# Patient Record
Sex: Female | Born: 1985 | Race: Black or African American | Hispanic: No | Marital: Single | Smoking: Current every day smoker
Health system: Southern US, Community
[De-identification: ages and names within clinical notes are randomized; demographics above are authoritative.]

## PROBLEM LIST (undated history)

## (undated) ENCOUNTER — Inpatient Hospital Stay (HOSPITAL_COMMUNITY): Payer: Self-pay

## (undated) DIAGNOSIS — L309 Dermatitis, unspecified: Secondary | ICD-10-CM

## (undated) DIAGNOSIS — Z9109 Other allergy status, other than to drugs and biological substances: Secondary | ICD-10-CM

## (undated) DIAGNOSIS — K649 Unspecified hemorrhoids: Secondary | ICD-10-CM

## (undated) DIAGNOSIS — J45909 Unspecified asthma, uncomplicated: Secondary | ICD-10-CM

## (undated) DIAGNOSIS — Z8619 Personal history of other infectious and parasitic diseases: Secondary | ICD-10-CM

## (undated) DIAGNOSIS — J309 Allergic rhinitis, unspecified: Secondary | ICD-10-CM

## (undated) DIAGNOSIS — E890 Postprocedural hypothyroidism: Secondary | ICD-10-CM

## (undated) DIAGNOSIS — Z8639 Personal history of other endocrine, nutritional and metabolic disease: Secondary | ICD-10-CM

## (undated) DIAGNOSIS — Z8701 Personal history of pneumonia (recurrent): Secondary | ICD-10-CM

## (undated) HISTORY — PX: ADENOIDECTOMY: SUR15

---
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
## 2006-12-26 ENCOUNTER — Ambulatory Visit: Payer: Self-pay | Admitting: Nurse Practitioner

## 2008-01-11 ENCOUNTER — Emergency Department (HOSPITAL_COMMUNITY): Admission: EM | Admit: 2008-01-11 | Discharge: 2008-01-11 | Payer: Self-pay | Admitting: Emergency Medicine

## 2008-01-12 ENCOUNTER — Emergency Department (HOSPITAL_COMMUNITY): Admission: EM | Admit: 2008-01-12 | Discharge: 2008-01-12 | Payer: Self-pay | Admitting: Emergency Medicine

## 2008-08-03 ENCOUNTER — Inpatient Hospital Stay (HOSPITAL_COMMUNITY): Admission: AD | Admit: 2008-08-03 | Discharge: 2008-08-03 | Payer: Self-pay | Admitting: Obstetrics & Gynecology

## 2008-08-13 HISTORY — PX: WISDOM TOOTH EXTRACTION: SHX21

## 2008-08-15 ENCOUNTER — Inpatient Hospital Stay (HOSPITAL_COMMUNITY): Admission: AD | Admit: 2008-08-15 | Discharge: 2008-08-15 | Payer: Self-pay | Admitting: Family Medicine

## 2009-02-09 ENCOUNTER — Inpatient Hospital Stay (HOSPITAL_COMMUNITY): Admission: AD | Admit: 2009-02-09 | Discharge: 2009-02-09 | Payer: Self-pay | Admitting: Obstetrics and Gynecology

## 2009-03-08 ENCOUNTER — Inpatient Hospital Stay (HOSPITAL_COMMUNITY): Admission: AD | Admit: 2009-03-08 | Discharge: 2009-03-11 | Payer: Self-pay | Admitting: Obstetrics and Gynecology

## 2009-04-27 ENCOUNTER — Emergency Department (HOSPITAL_COMMUNITY): Admission: EM | Admit: 2009-04-27 | Discharge: 2009-04-27 | Payer: Self-pay | Admitting: Emergency Medicine

## 2010-11-19 LAB — CBC
HCT: 30.6 % — ABNORMAL LOW (ref 36.0–46.0)
HCT: 35.6 % — ABNORMAL LOW (ref 36.0–46.0)
Hemoglobin: 12.3 g/dL (ref 12.0–15.0)
MCHC: 34.5 g/dL (ref 30.0–36.0)
MCHC: 35 g/dL (ref 30.0–36.0)
MCV: 89.4 fL (ref 78.0–100.0)
MCV: 90 fL (ref 78.0–100.0)
Platelets: 185 10*3/uL (ref 150–400)
Platelets: 236 10*3/uL (ref 150–400)
RDW: 13.7 % (ref 11.5–15.5)
RDW: 13.9 % (ref 11.5–15.5)
WBC: 10.8 10*3/uL — ABNORMAL HIGH (ref 4.0–10.5)

## 2010-11-20 LAB — TSH: TSH: 1.733 u[IU]/mL (ref 0.350–4.500)

## 2010-11-20 LAB — CBC
MCHC: 35 g/dL (ref 30.0–36.0)
Platelets: 225 10*3/uL (ref 150–400)
RDW: 13.2 % (ref 11.5–15.5)

## 2010-11-20 LAB — DIFFERENTIAL
Basophils Relative: 0 % (ref 0–1)
Eosinophils Absolute: 0 10*3/uL (ref 0.0–0.7)
Lymphs Abs: 1.5 10*3/uL (ref 0.7–4.0)
Monocytes Absolute: 0.6 10*3/uL (ref 0.1–1.0)
Monocytes Relative: 9 % (ref 3–12)
Neutro Abs: 4.6 10*3/uL (ref 1.7–7.7)
Neutrophils Relative %: 69 % (ref 43–77)

## 2010-11-20 LAB — COMPREHENSIVE METABOLIC PANEL
ALT: 13 U/L (ref 0–35)
Albumin: 2.9 g/dL — ABNORMAL LOW (ref 3.5–5.2)
Alkaline Phosphatase: 83 U/L (ref 39–117)
Calcium: 9.2 mg/dL (ref 8.4–10.5)
Glucose, Bld: 69 mg/dL — ABNORMAL LOW (ref 70–99)
Potassium: 4.2 mEq/L (ref 3.5–5.1)
Sodium: 133 mEq/L — ABNORMAL LOW (ref 135–145)
Total Protein: 6.7 g/dL (ref 6.0–8.3)

## 2010-11-20 LAB — ACETAMINOPHEN LEVEL: Acetaminophen (Tylenol), Serum: <10 ug/mL — ABNORMAL LOW (ref 10–30)

## 2010-11-20 LAB — T4: T4, Total: 12.7 ug/dL — ABNORMAL HIGH (ref 5.0–12.5)

## 2010-11-27 LAB — URINALYSIS, ROUTINE W REFLEX MICROSCOPIC
Nitrite: NEGATIVE
Specific Gravity, Urine: 1.015 (ref 1.005–1.030)
Urobilinogen, UA: 1 mg/dL (ref 0.0–1.0)
pH: 6.5 (ref 5.0–8.0)

## 2010-12-26 NOTE — H&P (Signed)
Gwendolyn Clay, PITA NO.:  [id number]   MEDICAL RECORD NO.:  [id number]          PATIENT TYPE:  INP   LOCATION:  9104                          FACILITY:  [facility]   PHYSICIAN:  Janine Limbo, M.D.DATE OF BIRTH:  [birth year]   DATE OF ADMISSION:  03/08/2009  DATE OF DISCHARGE:                              HISTORY & PHYSICAL   HISTORY OF PRESENT ILLNESS:  [name] is a 25 year old female, gravida  1, para 0, who presents at [redacted] weeks gestation (EDC is March 14, 2009).  The patient has been followed at the [facility] and  Gynecology for this pregnancy that has been complicated by a late  presentation for prenatal care.  Otherwise, she has done reasonably  well.  She does have a history of Graves disease.  Her thyroid testing  has been normal.   PAST MEDICAL HISTORY:  The patient has an elevated BMI.  She also has a  history of Graves disease, but her thyroid testing has been normal  during this pregnancy.  She had a tonsillectomy and adenoidectomy at age  58 years.   DRUG ALLERGIES:  No known drug allergies, LATEX allergies, or BETADINE  allergies.   SOCIAL HISTORY:  The patient is single.  She denies cigarette use,  alcohol use, and recreational drug use.   REVIEW OF SYSTEMS:  Normal pregnancy complaints except for rupture of  membranes earlier today.   FAMILY HISTORY:  Noncontributory.   PHYSICAL EXAMINATION:  VITAL SIGNS:  Weight is 285 pounds, height is 5  feet 10 inches.  HEENT:  Within normal limits.  CHEST:  Clear.  HEART:  Regular rate and rhythm.  BREASTS:  Without masses.  ABDOMEN:  Gravid and her fundal height is 37 cm.  EXTREMITIES:  Grossly normal.  NEUROLOGIC:  Grossly normal.   Pelvic exam, cervix is 3 cm dilated, 70% effaced, and -2 station.   LABORATORY VALUES:  Blood type is A+, antibody screen negative, VDRL is  nonreactive, sickle cell negative, rubella immune, HBsAg negative, GC  negative, Chlamydia negative.   Alpha-fetoprotein was normal.  Glucose is  79.  Third trimester beta-strep is negative.   ASSESSMENT:  1. A 39 weeks' gestation.  2. Rupture of membranes.  3. The patient is not in active labor.  Contractions are mild and her      nonstress test is reactive.  4. Graves disease.   PLAN:  The patient has been started on Pitocin to augment her labor.       Janine Limbo, M.D.  Electronically Signed     AVS/MEDQ  D:  03/08/2009  T:  03/09/2009  Job:  [id number]

## 2010-12-26 NOTE — Discharge Summary (Signed)
[name], [name]            ACCOUNT NO.:  [id number]   MEDICAL RECORD NO.:  [id number]          PATIENT TYPE:  INP   LOCATION:  9104                          [facility]ACILITY:  [facility]   PHYSICIAN:  Naima A. Dillard, M.D. DATE OF BIRTH:  1986/07/07   DATE OF ADMISSION:  03/08/2009  DATE OF DISCHARGE:  03/11/2009                               DISCHARGE SUMMARY   Ms. Gwendolyn Clay is a 25 year old gravida 1, para 1 who presented at 55 weeks'  gestation, due date is March 14, 2009.  She has been followed by [facility] for this pregnancy and it has been complicated by late  presentation for prenatal care.  She transferred from [place].  She  does have a history of Graves disease and thyroid testing has been  normal.   PAST MEDICAL HISTORY:  She had an elevated BMI.  She also had a history  of Graves disease, but her thyroid testing was normal.  She had a T and  A at age 71.   DRUG ALLERGIES:  No known drug allergies, latex allergies, or Betadine  allergies.   SOCIAL HISTORY:  She is single.  Denies cigarette use, alcohol, and  recreational drugs.   Normal pregnancy complaints.  Today, she ruptured the membranes on the  27th.   FAMILY HISTORY:  Noncontributory.   On admission, she was 3 cm dilated, 70% effaced, and -2.  She is A  positive, antibody screen negative, VDRL nonreactive, sickle cell  negative, rubella immune, HBsAg negative, GBS negative, Chlamydia  negative, AFP was normal.  She was admitted at 3 cm dilated.  Blood  pressure is 126/81.  Fetal heart tone was 130s to 140s with  accelerations noted.  She continued laboring and on March 09, 2009, at  4:42 a.m., she had a spontaneous vaginal delivery of a viable female  infant weighing 5 pounds 12 ounces, named Marita, Apgars were 8 and 9.  She had a second-degree midline laceration with a first-degree left  periurethral laceration that was found and the patient was delivered by  Dr. Stefano Gaul.  The placenta was sent to the  lab.  Postpartum course was  uneventful on day of discharge, March 11, 2009, at 11:20 a.m.  Vital  signs were normal.  Lungs clear bilaterally.  Heart, regular rate  without murmur.  Abdomen, fundus firm.  No distention.  Laceration was  healing.  Homan sign was negative on postpartum day #2 and she was doing  well and was discharged home.  She was discharged home with a  prescription for ferrous sulfate  1 b.i.d. due to hemoglobin of 10.7; pre-delivery, her hemoglobin was 12.  She is breastfeeding.  She would like to have an IUD ParaGard for birth  control.  Her blood type is A positive, HBsAg was negative, RPR was  negative, and HIV was negative.      Jasmine Awe, CNM      Naima A. Normand Sloop, M.D.  Electronically Signed    JM/MEDQ  D:  03/11/2009  T:  03/12/2009  Job:  [id number]

## 2011-01-12 IMAGING — US US OB COMP +14 WK
2 series · 14 of 28 positions shown · non-contrast
Comparison: none

OBSTETRICAL ULTRASOUND:
 This ultrasound exam was performed in the [HOSPITAL] Ultrasound Department.  The OB US report was generated in the AS system, and faxed to the ordering physician.  This report is also available in [REDACTED] PACS.

[Series 1: us ob comp +14 wk · 0.24mm/px · 2 of 10 slices shown (1 of 2)]
[im 3/10]
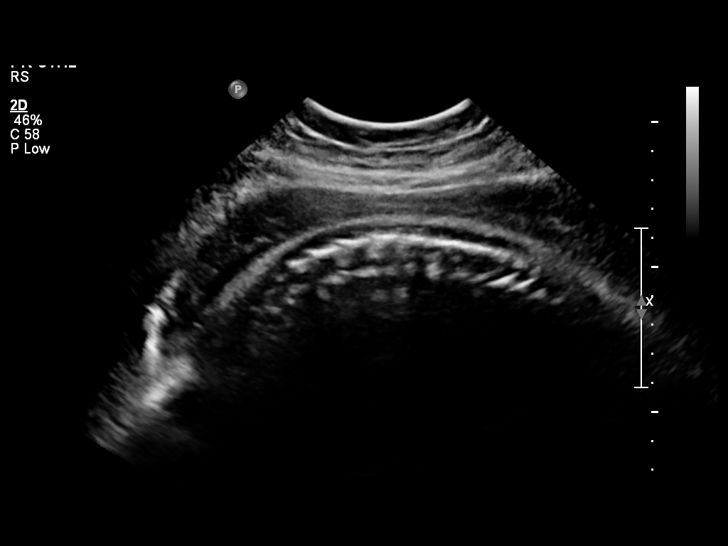
[im 7/10]
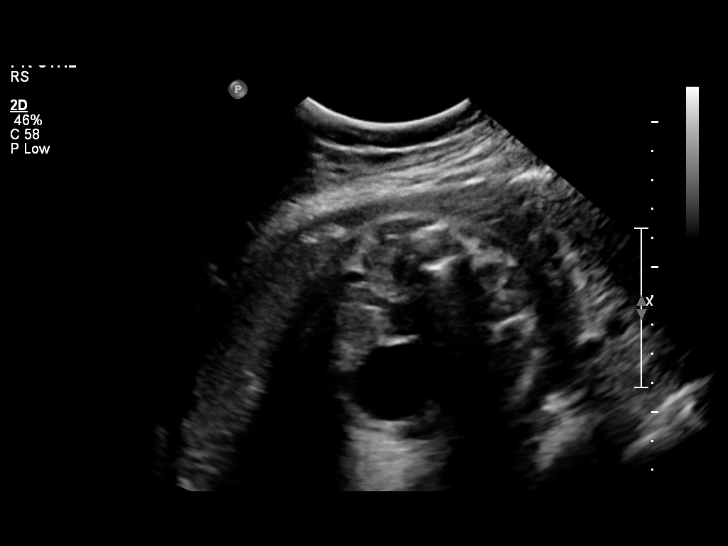

[Series 1: us ob comp +14 wk · 0.30mm/px · 12 of 41 slices shown (2 of 2)]
[im 1/41]
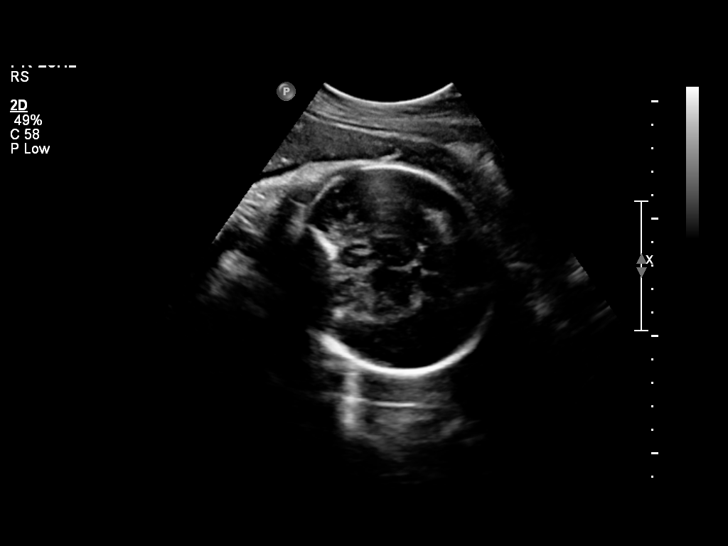
[im 4/41]
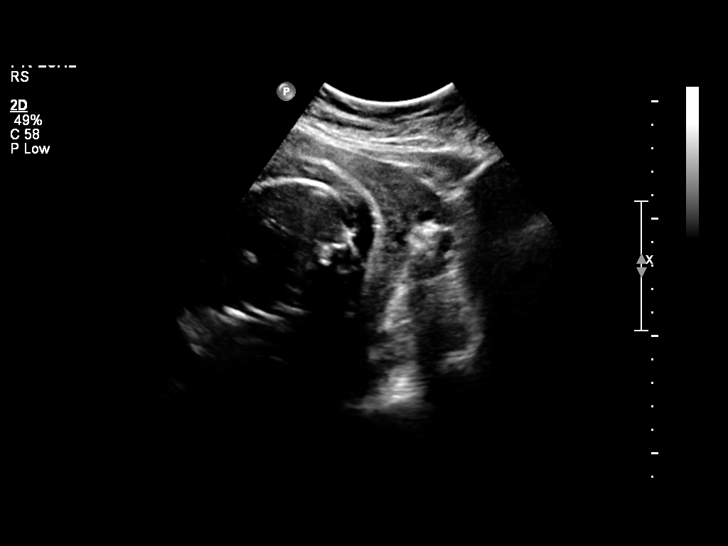
[im 8/41]
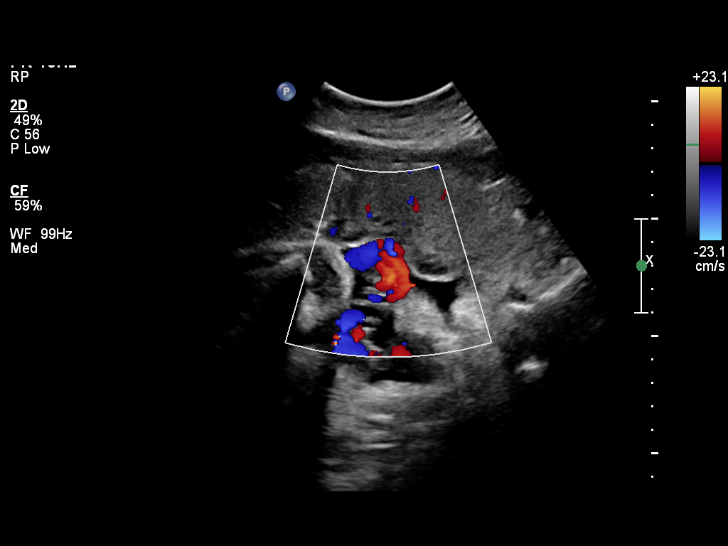
[im 11/41]
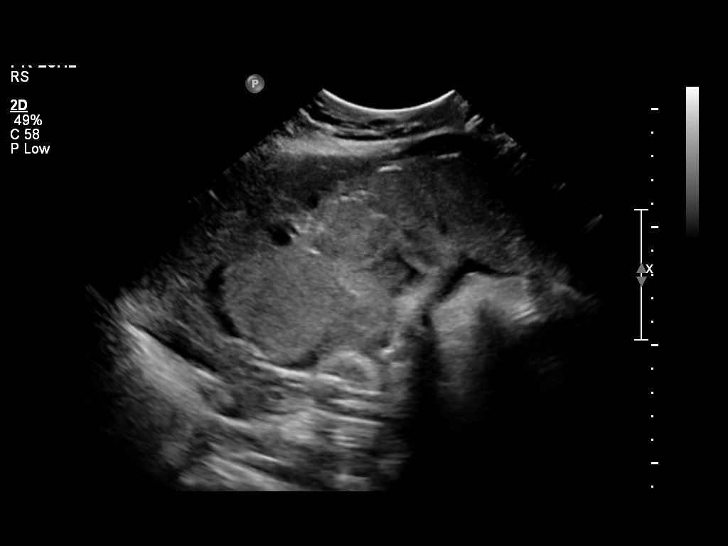
[im 15/41]
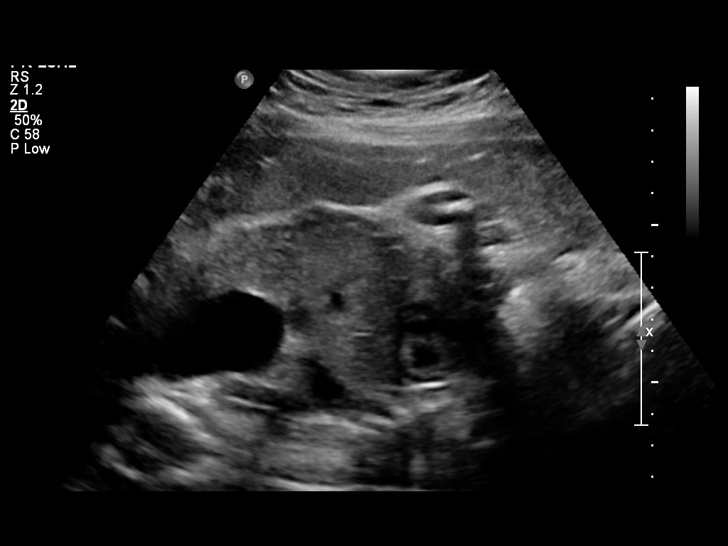
[im 19/41]
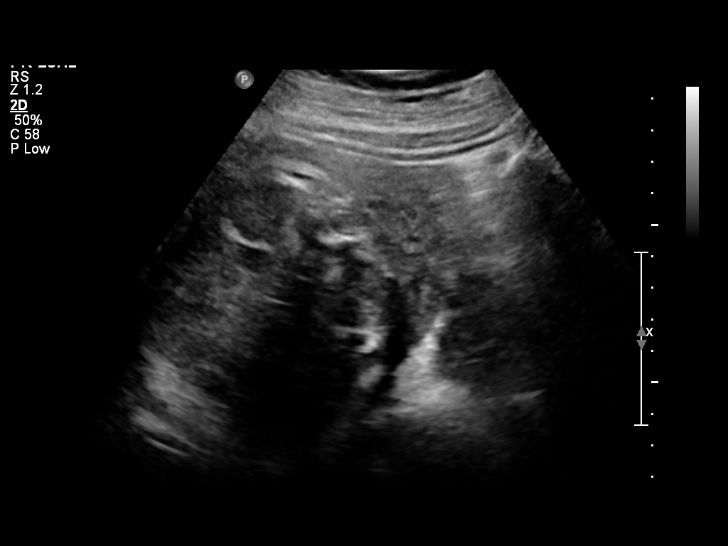
[im 22/41]
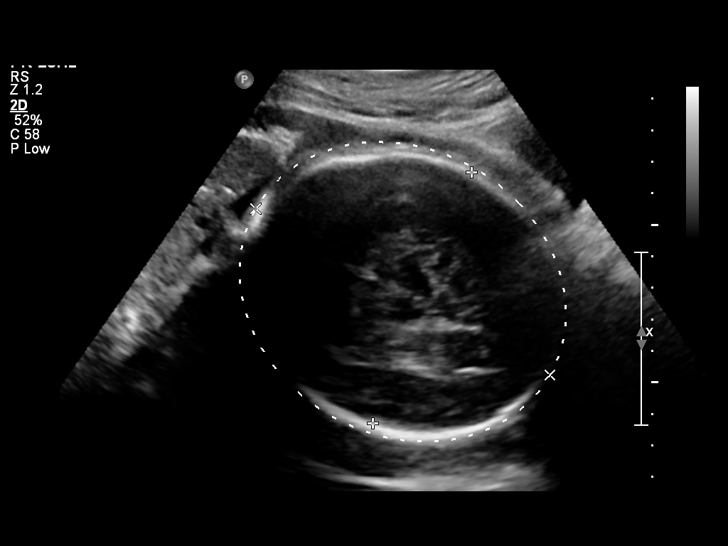
[im 26/41]
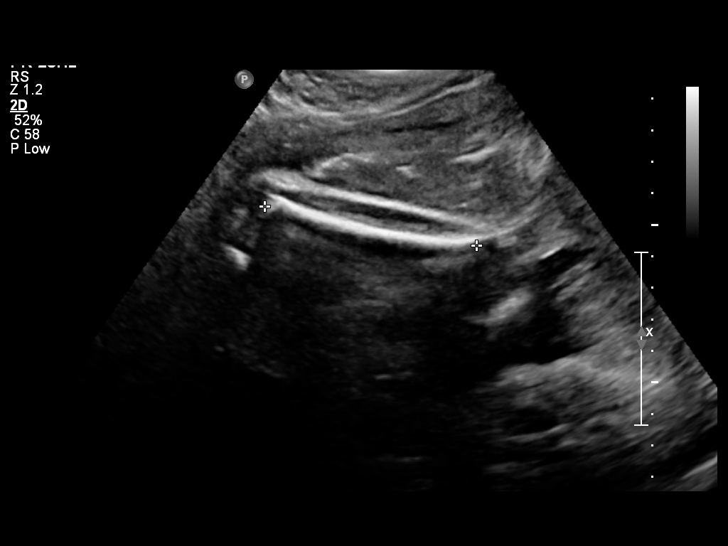
[im 30/41]
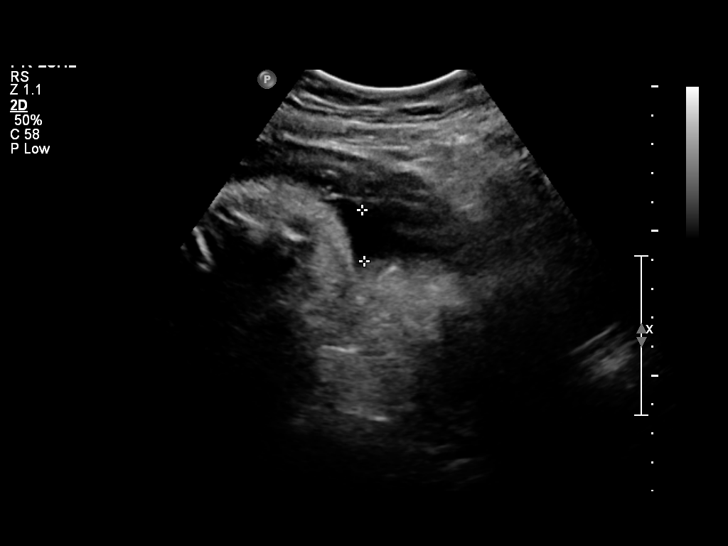
[im 33/41]
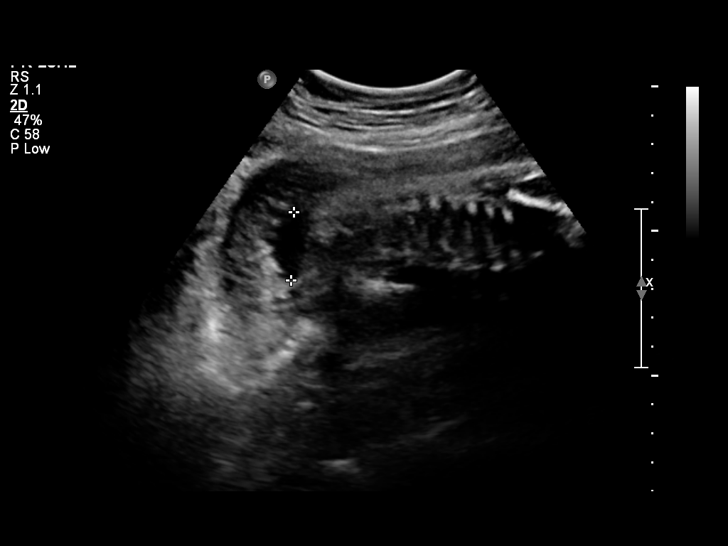
[im 37/41]
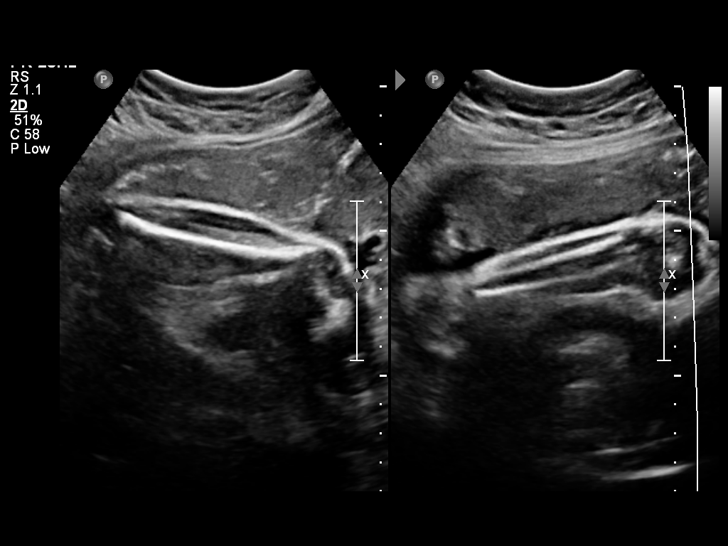
[im 41/41]
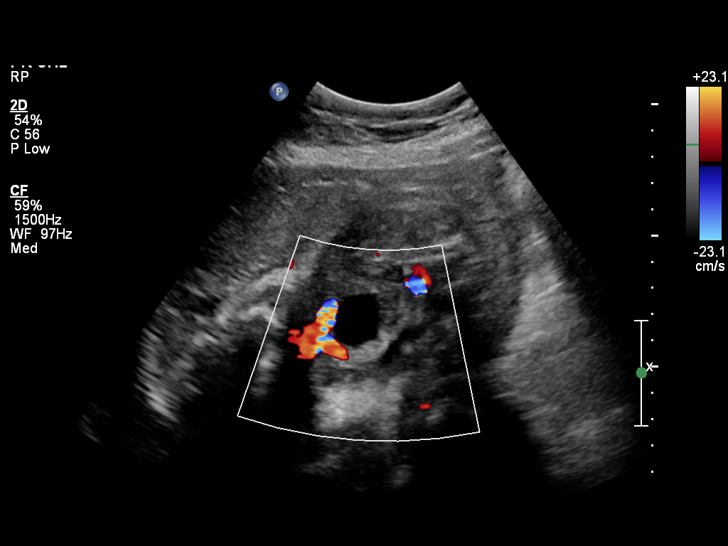

[14 of 28 positions shown; findings below may reference images not displayed]

IMPRESSION: See AS Obstetric US report.

## 2011-04-26 ENCOUNTER — Inpatient Hospital Stay (HOSPITAL_COMMUNITY): Payer: Medicaid Other

## 2011-04-26 ENCOUNTER — Inpatient Hospital Stay (HOSPITAL_COMMUNITY)
Admission: AD | Admit: 2011-04-26 | Discharge: 2011-04-26 | Disposition: A | Discharge status: Home / Self Care | Payer: Medicaid Other | Source: Ambulatory Visit | Attending: Family Medicine | Admitting: Family Medicine

## 2011-04-26 ENCOUNTER — Encounter (HOSPITAL_COMMUNITY): Payer: Self-pay

## 2011-04-26 DIAGNOSIS — Z30432 Encounter for removal of intrauterine contraceptive device: Secondary | ICD-10-CM | POA: Insufficient documentation

## 2011-04-26 DIAGNOSIS — N760 Acute vaginitis: Secondary | ICD-10-CM | POA: Insufficient documentation

## 2011-04-26 DIAGNOSIS — B9689 Other specified bacterial agents as the cause of diseases classified elsewhere: Secondary | ICD-10-CM | POA: Insufficient documentation

## 2011-04-26 DIAGNOSIS — A499 Bacterial infection, unspecified: Secondary | ICD-10-CM | POA: Insufficient documentation

## 2011-04-26 LAB — URINALYSIS, ROUTINE W REFLEX MICROSCOPIC
Bilirubin Urine: NEGATIVE
Glucose, UA: NEGATIVE mg/dL
Ketones, ur: NEGATIVE mg/dL
Leukocytes, UA: NEGATIVE
Nitrite: NEGATIVE
Protein, ur: NEGATIVE mg/dL
Specific Gravity, Urine: 1.025 (ref 1.005–1.030)
Urobilinogen, UA: 0.2 mg/dL (ref 0.0–1.0)
pH: 6 (ref 5.0–8.0)

## 2011-04-26 LAB — URINE MICROSCOPIC-ADD ON

## 2011-04-26 LAB — WET PREP, GENITAL
Trich, Wet Prep: NONE SEEN
Yeast Wet Prep HPF POC: NONE SEEN

## 2011-04-26 LAB — CBC
HCT: 38.6 % (ref 36.0–46.0)
Hemoglobin: 13.2 g/dL (ref 12.0–15.0)
MCH: 29.9 pg (ref 26.0–34.0)
MCHC: 34.2 g/dL (ref 30.0–36.0)
MCV: 87.5 fL (ref 78.0–100.0)
Platelets: 289 K/uL (ref 150–400)
RBC: 4.41 MIL/uL (ref 3.87–5.11)
RDW: 13.4 % (ref 11.5–15.5)
WBC: 5.7 K/uL (ref 4.0–10.5)

## 2011-04-26 LAB — POCT PREGNANCY, URINE: Preg Test, Ur: NEGATIVE

## 2011-04-26 MED ORDER — METRONIDAZOLE 500 MG PO TABS: 500.0000 mg | ORAL_TABLET | Freq: Two times a day (BID) | ORAL | Status: AC

## 2011-04-26 NOTE — ED Provider Notes (Signed)
History     Chief Complaint  Patient presents with  . Abdominal Pain   HPIGabrielle I Gbonah25 y.o.G1 P1.  [facility] patient of [facility] cannot go back now because her Medicaid expired.  Had IUD placed 9/10 stayed in until Nov 11.  It was removed because "it was slipping out".  Had plan to have U/S to eval for fibroids but insurance ran out.  Had another IUD placed 11/11.  It is a [employer] fast streamer.  Is asking for IUD removal today because she is "moody".  She understands this will take away her contraception.  Is sexually active X 1 partner.  LMP  6 days ago.  Cycles are irregular.   Had a knot on her right abdomen that is tender.   Also report mood swings, fatigue, nausea off and on.    Past Medical History  Diagnosis Date  . Hypothyroidism     Past Surgical History  Procedure Date  . Adenoidectomy     No family history on file.  History  Substance Use Topics  . Smoking status: Current Everyday Smoker -- 0.5 packs/day  . Smokeless tobacco: Never Used  . Alcohol Use: No    Allergies: No Known Allergies  No prescriptions prior to admission    Review of Systems  Constitutional: Positive for malaise/fatigue.  Gastrointestinal: Positive for nausea and abdominal pain (knot on the right side that is tender). Negative for vomiting.  Genitourinary:       Denies vaginal discharge.   Physical Exam   Blood pressure 116/77, pulse 73, temperature 98.4 F (36.9 C), temperature source Oral, resp. rate 20, height 5' 9.5" (1.765 m), weight 288 lb 6.4 oz (130.817 kg), last menstrual period 04/20/2011, SpO2 97.00%.  Physical Exam  Constitutional: She is oriented to person, place, and time. She appears well-developed and well-nourished. No distress.  Neck: Normal range of motion.  Respiratory: Effort normal.  GI: Soft. She exhibits no distension and no mass. There is Tenderness: mild tenderness over a small bb shaped nodulethat is firm but mobile superficial left lower quadrant.. There is no rebound and  no guarding.  Genitourinary: Uterus is enlarged and tender. Cervix exhibits no motion tenderness, no discharge and no friability. Right adnexum displays no mass, no tenderness and no fullness. Left adnexum displays no mass, no tenderness and no fullness. There is bleeding (small amount of scant foul smelling.  IUD string visualized) around the vagina.  Neurological: She is alert and oriented to person, place, and time.  Skin: Skin is warm and dry.    MAU Course  Procedures   Gc/Chl culture to lab  MDM Care turned over to E. Rice, PA.  Assessment and Plan    KEY,EVE M 04/26/2011, 4:31 PM   Gwendolyn Holmes, NP 04/26/11 1751

## 2011-04-26 NOTE — Progress Notes (Signed)
Pt states she has a lump on her right side, has IUD, questions if this is from the IUD. Upper abdominal cramping, has been nauseated, irritable, fatigued, pain at site of "lump".

## 2011-04-26 NOTE — Progress Notes (Signed)
Pt states she has been having RLQ to mid/upper abdominal pain for a few weeks. Noticed a lump on the RLQ with tenderness to palpation. Pt has a Mirena that was placed in November.

## 2011-04-26 NOTE — ED Provider Notes (Signed)
Attestation of Attending Supervision of Advanced Practitioner: Evaluation and management procedures were performed by the PA/NP/CNM/OB Fellow under my supervision/collaboration. Chart reviewed and agree with management and plan.  Oney Folz A 04/26/2011 6:45 PM

## 2011-04-26 NOTE — ED Provider Notes (Signed)
Chart reviewed and agree with management and plan.  

## 2011-04-26 NOTE — ED Provider Notes (Signed)
History   I have assumed care of this pt from Jeani Sow, FNP. Please see prior H&P.  Chief Complaint  Patient presents with  . Abdominal Pain   HPI  OB History    Grav Para Term Preterm Abortions TAB SAB Ect Mult Living   1 1 1       1       Past Medical History  Diagnosis Date  . Hypothyroidism     Past Surgical History  Procedure Date  . Adenoidectomy     No family history on file.  History  Substance Use Topics  . Smoking status: Current Everyday Smoker -- 0.5 packs/day  . Smokeless tobacco: Never Used  . Alcohol Use: No    Allergies: No Known Allergies  No prescriptions prior to admission    ROS Physical Exam   Blood pressure 116/77, pulse 73, temperature 98.4 F (36.9 C), temperature source Oral, resp. rate 20, height 5' 9.5" (1.765 m), weight 288 lb 6.4 oz (130.817 kg), last menstrual period 04/20/2011, SpO2 97.00%.  Physical Exam  MAU Course  Procedures  Results for orders placed during the hospital encounter of 04/26/11 (from the past 24 hour(s))  URINALYSIS, ROUTINE W REFLEX MICROSCOPIC     Status: Abnormal   Collection Time   04/26/11  2:40 PM      Component Value Range   Color, Urine YELLOW  YELLOW    Appearance CLEAR  CLEAR    Specific Gravity, Urine 1.025  1.005 - 1.030    pH 6.0  5.0 - 8.0    Glucose, UA NEGATIVE  NEGATIVE (mg/dL)   Hgb urine dipstick TRACE (*) NEGATIVE    Bilirubin Urine NEGATIVE  NEGATIVE    Ketones, ur NEGATIVE  NEGATIVE (mg/dL)   Protein, ur NEGATIVE  NEGATIVE (mg/dL)   Urobilinogen, UA 0.2  0.0 - 1.0 (mg/dL)   Nitrite NEGATIVE  NEGATIVE    Leukocytes, UA NEGATIVE  NEGATIVE   URINE MICROSCOPIC-ADD ON     Status: Abnormal   Collection Time   04/26/11  2:40 PM      Component Value Range   Squamous Epithelial / LPF FEW (*) RARE    WBC, UA 0-2  <3 (WBC/hpf)   RBC / HPF 0-2  <3 (RBC/hpf)   Bacteria, UA FEW (*) RARE   CBC     Status: Normal   Collection Time   04/26/11  2:45 PM      Component Value Range   WBC 5.7   4.0 - 10.5 (K/uL)   RBC 4.41  3.87 - 5.11 (MIL/uL)   Hemoglobin 13.2  12.0 - 15.0 (g/dL)   HCT 16.1  09.6 - 04.5 (%)   MCV 87.5  78.0 - 100.0 (fL)   MCH 29.9  26.0 - 34.0 (pg)   MCHC 34.2  30.0 - 36.0 (g/dL)   RDW 40.9  81.1 - 91.4 (%)   Platelets 289  150 - 400 (K/uL)  POCT PREGNANCY, URINE     Status: Normal   Collection Time   04/26/11  2:45 PM      Component Value Range   Preg Test, Ur NEGATIVE    WET PREP, GENITAL     Status: Abnormal   Collection Time   04/26/11  4:45 PM      Component Value Range   Yeast, Wet Prep NONE SEEN  NONE SEEN    Trich, Wet Prep NONE SEEN  NONE SEEN    Clue Cells, Wet Prep MODERATE (*) NONE SEEN  WBC, Wet Prep HPF POC FEW (*) NONE SEEN    US Transvaginal Non-ob  04/26/2011  *RADIOLOGY REPORT*  Clinical Data: Abnormal bleeding, IUD in place.  Negative urine pregnancy test.  TRANSABDOMINAL AND TRANSVAGINAL ULTRASOUND OF PELVIS Technique:  Both transabdominal and transvaginal ultrasound examinations of the pelvis were performed. Transabdominal technique was performed for global imaging of the pelvis including uterus, ovaries, adnexal regions, and pelvic cul-de-sac.  Comparison: 02/09/2009   It was necessary to proceed with endovaginal exam following the transabdominal exam to visualize the endometrium and adnexa.  Findings:  Uterus: Measures 9.0 x 3.4 x 5.2 cm.  No focal lesion.  Endometrium: Normal in thickness at 2 mm.  An IUD is present, positioned within the lower uterine segment.  Right ovary:  Normal appearance.  No adnexal mass.  The right ovary measures 3.6 x 2.2 x 2.5 cm.  Left ovary: Normal sonographic appearance.  No adnexal mass.  The left ovary measures 3.5 x 1.5 x 1.8 cm.  Other findings: No free fluid.  IMPRESSION: The IUD is malpositioned, located within the lower uterine segment.  Original Report Authenticated By: Waneta Martins, M.D.   US Pelvis Complete  04/26/2011  *RADIOLOGY REPORT*  Clinical Data: Abnormal bleeding, IUD in place.   Negative urine pregnancy test.  TRANSABDOMINAL AND TRANSVAGINAL ULTRASOUND OF PELVIS Technique:  Both transabdominal and transvaginal ultrasound examinations of the pelvis were performed. Transabdominal technique was performed for global imaging of the pelvis including uterus, ovaries, adnexal regions, and pelvic cul-de-sac.  Comparison: 02/09/2009   It was necessary to proceed with endovaginal exam following the transabdominal exam to visualize the endometrium and adnexa.  Findings:  Uterus: Measures 9.0 x 3.4 x 5.2 cm.  No focal lesion.  Endometrium: Normal in thickness at 2 mm.  An IUD is present, positioned within the lower uterine segment.  Right ovary:  Normal appearance.  No adnexal mass.  The right ovary measures 3.6 x 2.2 x 2.5 cm.  Left ovary: Normal sonographic appearance.  No adnexal mass.  The left ovary measures 3.5 x 1.5 x 1.8 cm.  Other findings: No free fluid.  IMPRESSION: The IUD is malpositioned, located within the lower uterine segment.  Original Report Authenticated By: Waneta Martins, M.D.   Pt desires IUD removal. Time Out was performed. Speculum exam was performed and IUD strings were easily visualized. Ring forceps were used and IUD was easily removed. Pt tolerated this procedure well.  Assessment and Plan  BV: discussed with pt at length. Will tx with Flagyl. Warned of antabuse reaction.  IUD removal: discussed with pt at length. Discussed birth control options. She will use condoms and abstinence. Discussed safe sex precautions.  Abd lump: appears to by solitary lymph node that is easily mobile. She will f/u with PCP. Hopefully this will resolve following Flagyl. She will return immediately with worsening sx. Discussed diet, activity, risks, and precautions.  Clinton Gallant. Hedwig Mcfall III, DrHSc, MPAS, PA-C  04/26/2011, 6:12 PM   Henrietta Hoover, PA 04/26/11 1817

## 2011-04-27 LAB — GC/CHLAMYDIA PROBE AMP, GENITAL: Chlamydia, DNA Probe: NEGATIVE

## 2011-05-09 LAB — URINALYSIS, ROUTINE W REFLEX MICROSCOPIC
Bilirubin Urine: NEGATIVE
Glucose, UA: NEGATIVE
Protein, ur: NEGATIVE
Urobilinogen, UA: 0.2

## 2011-05-09 LAB — COMPREHENSIVE METABOLIC PANEL
ALT: 14
AST: 16
CO2: 26
Calcium: 9
Chloride: 107
GFR calc Af Amer: >60
GFR calc non Af Amer: >60
Glucose, Bld: 101 — ABNORMAL HIGH
Sodium: 138
Total Bilirubin: 0.8

## 2011-05-09 LAB — LIPASE, BLOOD: Lipase: 16

## 2011-05-09 LAB — DIFFERENTIAL
Basophils Absolute: 0
Basophils Relative: 0
Eosinophils Absolute: 0
Eosinophils Relative: 0
Lymphs Abs: 1.2
Neutrophils Relative %: 77

## 2011-05-09 LAB — URINE MICROSCOPIC-ADD ON

## 2011-05-09 LAB — CBC
Hemoglobin: 13.6
MCHC: 33.8
MCV: 86.4
RBC: 4.65
WBC: 7.3

## 2011-05-09 LAB — POCT PREGNANCY, URINE: Preg Test, Ur: NEGATIVE

## 2011-05-10 LAB — DIFFERENTIAL
Eosinophils Absolute: 0
Eosinophils Relative: 1
Lymphocytes Relative: 22
Lymphs Abs: 1.3
Monocytes Relative: 5

## 2011-05-10 LAB — COMPREHENSIVE METABOLIC PANEL
ALT: 15
AST: 23
Albumin: 3.1 — ABNORMAL LOW
CO2: 25
Calcium: 8.9
Creatinine, Ser: 1.53 — ABNORMAL HIGH
GFR calc Af Amer: 52 — ABNORMAL LOW
GFR calc non Af Amer: 43 — ABNORMAL LOW
Sodium: 141
Total Protein: 6.7

## 2011-05-10 LAB — URINALYSIS, ROUTINE W REFLEX MICROSCOPIC
Bilirubin Urine: NEGATIVE
Glucose, UA: NEGATIVE
Nitrite: NEGATIVE
Specific Gravity, Urine: 1.004 — ABNORMAL LOW
pH: 6.5

## 2011-05-10 LAB — CBC
MCHC: 33.8
MCV: 85.7
Platelets: 316
RBC: 4.83
RDW: 13.9

## 2011-05-10 LAB — POCT PREGNANCY, URINE: Preg Test, Ur: NEGATIVE

## 2011-05-10 LAB — LIPASE, BLOOD: Lipase: 16

## 2011-05-10 LAB — WET PREP, GENITAL: Clue Cells Wet Prep HPF POC: NONE SEEN

## 2011-05-10 LAB — URINE MICROSCOPIC-ADD ON

## 2011-05-18 LAB — COMPREHENSIVE METABOLIC PANEL
CO2: 24 mEq/L (ref 19–32)
Calcium: 9.6 mg/dL (ref 8.4–10.5)
Creatinine, Ser: 0.63 mg/dL (ref 0.4–1.2)
GFR calc non Af Amer: >60 mL/min (ref >60)
Glucose, Bld: 84 mg/dL (ref 70–99)

## 2011-05-18 LAB — HCG, QUANTITATIVE, PREGNANCY: hCG, Beta Chain, Quant, S: 33692 m[IU]/mL — ABNORMAL HIGH (ref <5)

## 2011-05-18 LAB — URINALYSIS, ROUTINE W REFLEX MICROSCOPIC
Nitrite: NEGATIVE
Protein, ur: NEGATIVE mg/dL
Specific Gravity, Urine: <1.005 — ABNORMAL LOW (ref 1.005–1.030)
Urobilinogen, UA: 0.2 mg/dL (ref 0.0–1.0)

## 2011-05-18 LAB — POCT PREGNANCY, URINE: Preg Test, Ur: POSITIVE

## 2011-05-18 LAB — CBC
MCHC: 33.9 g/dL (ref 30.0–36.0)
MCV: 88.5 fL (ref 78.0–100.0)
Platelets: 235 10*3/uL (ref 150–400)
RDW: 13 % (ref 11.5–15.5)

## 2011-05-18 LAB — WET PREP, GENITAL: Trich, Wet Prep: NONE SEEN

## 2011-08-30 ENCOUNTER — Emergency Department (HOSPITAL_COMMUNITY): Payer: Medicaid Other

## 2011-08-30 ENCOUNTER — Encounter (HOSPITAL_COMMUNITY): Payer: Self-pay | Admitting: Emergency Medicine

## 2011-08-30 ENCOUNTER — Emergency Department (HOSPITAL_COMMUNITY)
Admission: EM | Admit: 2011-08-30 | Discharge: 2011-08-30 | Disposition: A | Discharge status: Home / Self Care | Payer: Medicaid Other | Attending: Emergency Medicine | Admitting: Emergency Medicine

## 2011-08-30 DIAGNOSIS — R269 Unspecified abnormalities of gait and mobility: Secondary | ICD-10-CM | POA: Insufficient documentation

## 2011-08-30 DIAGNOSIS — M25476 Effusion, unspecified foot: Secondary | ICD-10-CM | POA: Insufficient documentation

## 2011-08-30 DIAGNOSIS — S93401A Sprain of unspecified ligament of right ankle, initial encounter: Secondary | ICD-10-CM

## 2011-08-30 DIAGNOSIS — M25579 Pain in unspecified ankle and joints of unspecified foot: Secondary | ICD-10-CM | POA: Insufficient documentation

## 2011-08-30 DIAGNOSIS — M25473 Effusion, unspecified ankle: Secondary | ICD-10-CM | POA: Insufficient documentation

## 2011-08-30 DIAGNOSIS — F17200 Nicotine dependence, unspecified, uncomplicated: Secondary | ICD-10-CM | POA: Insufficient documentation

## 2011-08-30 DIAGNOSIS — S93409A Sprain of unspecified ligament of unspecified ankle, initial encounter: Secondary | ICD-10-CM | POA: Insufficient documentation

## 2011-08-30 DIAGNOSIS — X500XXA Overexertion from strenuous movement or load, initial encounter: Secondary | ICD-10-CM | POA: Insufficient documentation

## 2011-08-30 MED ORDER — IBUPROFEN 600 MG PO TABS: 600.0000 mg | ORAL_TABLET | Freq: Four times a day (QID) | ORAL | Status: AC | PRN

## 2011-08-30 MED ORDER — TRAMADOL HCL 50 MG PO TABS: 50.0000 mg | ORAL_TABLET | Freq: Four times a day (QID) | ORAL | Status: AC | PRN

## 2011-08-30 MED ORDER — TRAMADOL HCL 50 MG PO TABS
50.0000 mg | ORAL_TABLET | Freq: Once | ORAL | Status: AC
  Administered 2011-08-30: 50 mg via ORAL
  Filled 2011-08-30: qty 1

## 2011-08-30 MED ORDER — IBUPROFEN 800 MG PO TABS
800.0000 mg | ORAL_TABLET | Freq: Once | ORAL | Status: AC
  Administered 2011-08-30: 800 mg via ORAL
  Filled 2011-08-30: qty 1

## 2011-08-30 NOTE — ED Notes (Signed)
Rx given x2 D/c instructions reviewed w/ pt - pt denies any further questions or concerns at present.   

## 2011-08-30 NOTE — ED Notes (Signed)
Pt reports falling and injuring her rt ankle this a.m. - pt states pain is progressively worse throughout the day. CMS intact.

## 2011-08-30 NOTE — ED Provider Notes (Signed)
History     CSN: [id number]  Arrival date & time 08/30/11  1953   First MD Initiated Contact with Patient 08/30/11 2010      Chief Complaint  Patient presents with  . Ankle Pain    Right    (Consider location/radiation/quality/duration/timing/severity/associated sxs/prior treatment) Patient is a 26 y.o. female presenting with ankle pain. The history is provided by the patient.  Ankle Pain  The incident occurred 12 to 24 hours ago. The incident occurred at home. The injury mechanism was torsion. The pain is present in the right ankle. The quality of the pain is described as throbbing. Pain severity now: mild when sitting, severe with weight bearing. The pain has been constant since onset. Pertinent negatives include no numbness, no loss of motion, no muscle weakness, no loss of sensation and no tingling. Associated symptoms comments: Bore weight immediately after injury, currently unable to bear weight. The symptoms are aggravated by bearing weight and palpation. She has tried nothing for the symptoms.    Past Medical History  Diagnosis Date  . Hypothyroidism     Past Surgical History  Procedure Date  . Adenoidectomy     No family history on file.  History  Substance Use Topics  . Smoking status: Current Everyday Smoker -- 0.5 packs/day  . Smokeless tobacco: Never Used  . Alcohol Use: No    OB History    Grav Para Term Preterm Abortions TAB SAB Ect Mult Living   1 1 1       1       Review of Systems  Constitutional: Negative for fever and chills.  Musculoskeletal: Positive for joint swelling and gait problem.  Skin: Negative for color change and wound.  Neurological: Negative for tingling, weakness and numbness.    Allergies  Review of patient's allergies indicates no known allergies.  Home Medications  No current outpatient prescriptions on file.  BP 103/62  Pulse 74  Temp(Src) 98.1 F (36.7 C) (Oral)  Resp 16  Wt 280 lb (127.007 kg)  SpO2 100%  LMP  08/27/2011  Physical Exam  Nursing note and vitals reviewed. Constitutional: She is oriented to person, place, and time. She appears well-developed and well-nourished. No distress.  HENT:  Head: Normocephalic and atraumatic.  Right Ear: External ear normal.  Left Ear: External ear normal.  Eyes: Pupils are equal, round, and reactive to light.  Neck: Normal range of motion. Neck supple.  Cardiovascular: Normal rate and regular rhythm.   Pulmonary/Chest: Effort normal. No respiratory distress.  Abdominal: Soft. She exhibits no distension. There is no tenderness.  Musculoskeletal: Normal range of motion.       Right ankle: She exhibits swelling. She exhibits normal range of motion, no ecchymosis, no deformity and normal pulse. tenderness. Lateral malleolus, AITFL and posterior TFL tenderness found. No medial malleolus, no head of 5th metatarsal and no proximal fibula tenderness found.       Feet:  Neurological: She is alert and oriented to person, place, and time. Coordination normal.  Skin: Skin is warm and dry. No erythema.    ED Course  Procedures (including critical care time)  Labs Reviewed - No data to display Dg Ankle Complete Right  08/30/2011  *RADIOLOGY REPORT*  Clinical Data: Fall with right ankle injury.  RIGHT ANKLE - COMPLETE 3+ VIEW  Comparison: None.  Findings: Lateral soft tissue swelling present.  No acute fracture or dislocation identified.  The ankle mortise shows normal alignment.  IMPRESSION: No acute fracture.  Original  Report Authenticated By: Reola Calkins, M.D.     Dx 1: Ankle sprain, right   MDM  X-ray reviewed, no fx or dislocation. Splint and crutches given. Ibuprofen and ultram rx.        Elwyn Reach [place], [place] 08/30/11 2120

## 2011-08-30 NOTE — ED Provider Notes (Signed)
Medical screening examination/treatment/procedure(s) were performed by non-physician practitioner and as supervising physician I was immediately available for consultation/collaboration.  Maniya Donovan R. Danilynn Jemison, MD 08/30/11 2307 

## 2011-08-30 NOTE — ED Notes (Signed)
Pt alert, nad, c/o right ankle pain, onset this am, s/p slip fall injury, PMS intact, mild edema noted

## 2012-04-16 ENCOUNTER — Ambulatory Visit: Payer: Self-pay | Admitting: Obstetrics and Gynecology

## 2012-05-07 ENCOUNTER — Encounter: Payer: Self-pay | Admitting: Obstetrics and Gynecology

## 2012-05-07 ENCOUNTER — Ambulatory Visit (INDEPENDENT_AMBULATORY_CARE_PROVIDER_SITE_OTHER): Payer: Medicaid Other | Admitting: Obstetrics and Gynecology

## 2012-05-07 VITALS — BP 118/72 | HR 80 | Ht 70.0 in | Wt 244.0 lb

## 2012-05-07 DIAGNOSIS — IMO0001 Reserved for inherently not codable concepts without codable children: Secondary | ICD-10-CM

## 2012-05-07 DIAGNOSIS — Z309 Encounter for contraceptive management, unspecified: Secondary | ICD-10-CM

## 2012-05-07 DIAGNOSIS — Z113 Encounter for screening for infections with a predominantly sexual mode of transmission: Secondary | ICD-10-CM

## 2012-05-07 DIAGNOSIS — Z0000 Encounter for general adult medical examination without abnormal findings: Secondary | ICD-10-CM

## 2012-05-07 DIAGNOSIS — Z01419 Encounter for gynecological examination (general) (routine) without abnormal findings: Secondary | ICD-10-CM

## 2012-05-07 DIAGNOSIS — Z124 Encounter for screening for malignant neoplasm of cervix: Secondary | ICD-10-CM

## 2012-05-07 MED ORDER — LEVONORGESTREL-ETHINYL ESTRAD 90-20 MCG PO TABS: 1.0000 | ORAL_TABLET | Freq: Every day | ORAL | Status: DC

## 2012-05-07 NOTE — Progress Notes (Signed)
Regular Periods: yes Mammogram: no  Monthly Breast Ex.: yes Exercise: no  Tetanus < 10 years: yes Seatbelts: yes  NI. Bladder Functn.: yes Abuse at home: no  Daily BM's: no Stressful Work: no  Healthy Diet: yes Sigmoid-Colonoscopy: NO  Calcium: no Medical problems this year: WANT BIRTH CONTROL   LAST PAP:2011  Contraception: CONDOMS   Mammogram:  NO  PCP: DR.   PMH: NO CHANGE  FMH: NO CHANGE  Last Bone Scan: NO   PT IS SINGLE.

## 2012-05-07 NOTE — Progress Notes (Signed)
Subjective:    Gwendolyn Clay is a 26 y.o. female, G1P1001, who presents for an annual exam. The patient reports no complaints.  Menstrual cycle:   LMP: Patient's last menstrual period was 04/15/2012.             Review of Systems Pertinent items are noted in HPI. Denies pelvic pain, urinary tract symptoms, vaginitis symptoms, irregular bleeding, menopausal symptoms, change in bowel habits or rectal bleeding   Objective:    BP 118/72  Pulse 80  Ht 5\' 10"  (1.778 m)  Wt 244 lb (110.678 kg)  BMI 35.01 kg/m2  LMP 04/15/2012    Wt Readings from Last 1 Encounters:  05/07/12 244 lb (110.678 kg)   Body mass index is 35.01 kg/(m^2). General Appearance: Alert, no acute distress HEENT: Grossly normal Neck / Thyroid: Supple, no thyromegaly or cervical adenopathy Lungs: Clear to auscultation bilaterally Back: No CVA tenderness Breast Exam: No masses or nodes.No dimpling, nipple retraction or discharge. Cardiovascular: Regular rate and rhythm.  Gastrointestinal: Soft, non-tender, no masses or organomegaly Pelvic Exam: EGBUS-wnl, vagina-normal rugae, cervix- without lesions or tenderness, uterus appears normal size shape and consistency, adnexae-no masses or tenderness Rectovaginal: no masses and normal sphincter tone Lymphatic Exam: Non-palpable nodes in neck, clavicular,  axillary, or inguinal regions  Skin: no rashes or abnormalities Extremities: no clubbing cyanosis or edema  Neurologic: grossly normal Psychiatric: Alert and oriented    Assessment:   Routine GYN Exam   Plan:  STD testing PAP sent RTO 1 year or prn  Maat Kafer,ELMIRAPA-C

## 2012-05-08 LAB — HIV ANTIBODY (ROUTINE TESTING W REFLEX): HIV: NONREACTIVE

## 2012-05-08 LAB — HEPATITIS B SURFACE ANTIGEN: Hepatitis B Surface Ag: NEGATIVE

## 2012-05-09 LAB — PAP IG, CT-NG, RFX HPV ASCU
Chlamydia Probe Amp: NEGATIVE
GC Probe Amp: NEGATIVE

## 2012-05-14 ENCOUNTER — Telehealth: Payer: Self-pay | Admitting: Obstetrics and Gynecology

## 2012-05-14 NOTE — Telephone Encounter (Signed)
Spoke with pt rgd lab work. Advised pt that all tests came back neg and hsv1 come back pos. Advised pt  Could have fever blister's or cols scores . Pt's voice understanding . bt cma

## 2012-06-10 ENCOUNTER — Telehealth: Payer: Self-pay | Admitting: Obstetrics and Gynecology

## 2012-06-10 NOTE — Telephone Encounter (Signed)
Tc to pt, pt c/o nausea and vomiting. Pt also c/o headache, cramping, bleeding, spotting, and sensitivity to light. Pt states that she only changes a super tampon once per day. Pt started taking bc pills 05/17/12, previously had an IUD. Advised pt to come in for appointment to be evaluated. Pt agreeable, scheduled appt with ND for tomorrow (06/11/12.)

## 2012-06-10 NOTE — Telephone Encounter (Signed)
Lm on vm for pt to call back.

## 2012-06-11 ENCOUNTER — Encounter: Payer: Medicaid Other | Admitting: Obstetrics and Gynecology

## 2012-06-13 ENCOUNTER — Telehealth: Payer: Self-pay | Admitting: Obstetrics and Gynecology

## 2012-06-16 NOTE — Telephone Encounter (Signed)
Pt called requesting an appt to be seen per msg below.  Pt scheduled an appt on Thursday 06/19/12 @ 1415 w/ EP.

## 2012-06-19 ENCOUNTER — Encounter: Payer: Medicaid Other | Admitting: Obstetrics and Gynecology

## 2012-07-09 ENCOUNTER — Emergency Department (HOSPITAL_COMMUNITY)
Admission: EM | Admit: 2012-07-09 | Discharge: 2012-07-09 | Disposition: A | Discharge status: Home / Self Care | Payer: Medicaid Other | Attending: Emergency Medicine | Admitting: Emergency Medicine

## 2012-07-09 ENCOUNTER — Encounter (HOSPITAL_COMMUNITY): Payer: Self-pay

## 2012-07-09 DIAGNOSIS — R197 Diarrhea, unspecified: Secondary | ICD-10-CM

## 2012-07-09 DIAGNOSIS — Z8619 Personal history of other infectious and parasitic diseases: Secondary | ICD-10-CM | POA: Insufficient documentation

## 2012-07-09 DIAGNOSIS — Z87891 Personal history of nicotine dependence: Secondary | ICD-10-CM | POA: Insufficient documentation

## 2012-07-09 DIAGNOSIS — E039 Hypothyroidism, unspecified: Secondary | ICD-10-CM | POA: Insufficient documentation

## 2012-07-09 DIAGNOSIS — N390 Urinary tract infection, site not specified: Secondary | ICD-10-CM | POA: Insufficient documentation

## 2012-07-09 DIAGNOSIS — R112 Nausea with vomiting, unspecified: Secondary | ICD-10-CM | POA: Insufficient documentation

## 2012-07-09 DIAGNOSIS — N946 Dysmenorrhea, unspecified: Secondary | ICD-10-CM | POA: Insufficient documentation

## 2012-07-09 LAB — COMPREHENSIVE METABOLIC PANEL
ALT: 12 U/L (ref 0–35)
Alkaline Phosphatase: 57 U/L (ref 39–117)
BUN: 6 mg/dL (ref 6–23)
CO2: 23 mEq/L (ref 19–32)
Chloride: 102 mEq/L (ref 96–112)
GFR calc Af Amer: >90 mL/min (ref >90)
GFR calc non Af Amer: >90 mL/min (ref >90)
Glucose, Bld: 84 mg/dL (ref 70–99)
Potassium: 3.8 mEq/L (ref 3.5–5.1)
Total Bilirubin: 0.7 mg/dL (ref 0.3–1.2)

## 2012-07-09 LAB — CBC WITH DIFFERENTIAL/PLATELET
Eosinophils Absolute: 0.1 10*3/uL (ref 0.0–0.7)
Hemoglobin: 14.2 g/dL (ref 12.0–15.0)
Lymphocytes Relative: 5 % — ABNORMAL LOW (ref 12–46)
Lymphs Abs: 0.3 10*3/uL — ABNORMAL LOW (ref 0.7–4.0)
MCH: 30 pg (ref 26.0–34.0)
Monocytes Relative: 6 % (ref 3–12)
Neutro Abs: 5.9 10*3/uL (ref 1.7–7.7)
Neutrophils Relative %: 89 % — ABNORMAL HIGH (ref 43–77)
RBC: 4.74 MIL/uL (ref 3.87–5.11)
WBC: 6.6 10*3/uL (ref 4.0–10.5)

## 2012-07-09 LAB — URINE MICROSCOPIC-ADD ON

## 2012-07-09 LAB — URINALYSIS, ROUTINE W REFLEX MICROSCOPIC
Glucose, UA: NEGATIVE mg/dL
Ketones, ur: NEGATIVE mg/dL
pH: 7.5 (ref 5.0–8.0)

## 2012-07-09 LAB — PREGNANCY, URINE: Preg Test, Ur: NEGATIVE

## 2012-07-09 MED ORDER — KETOROLAC TROMETHAMINE 30 MG/ML IJ SOLN
30.0000 mg | Freq: Once | INTRAMUSCULAR | Status: AC
  Administered 2012-07-09: 30 mg via INTRAVENOUS
  Filled 2012-07-09: qty 1

## 2012-07-09 MED ORDER — ONDANSETRON HCL 4 MG/2ML IJ SOLN
4.0000 mg | Freq: Once | INTRAMUSCULAR | Status: AC
  Administered 2012-07-09: 4 mg via INTRAVENOUS
  Filled 2012-07-09: qty 2

## 2012-07-09 MED ORDER — SULFAMETHOXAZOLE-TMP DS 800-160 MG PO TABS
1.0000 | ORAL_TABLET | Freq: Once | ORAL | Status: AC
  Administered 2012-07-09: 1 via ORAL
  Filled 2012-07-09: qty 1

## 2012-07-09 MED ORDER — ONDANSETRON HCL 4 MG PO TABS: 4.0000 mg | ORAL_TABLET | Freq: Four times a day (QID) | ORAL | Status: DC

## 2012-07-09 MED ORDER — SULFAMETHOXAZOLE-TRIMETHOPRIM 800-160 MG PO TABS: 1.0000 | ORAL_TABLET | Freq: Two times a day (BID) | ORAL | Status: DC

## 2012-07-09 MED ORDER — OXYCODONE-ACETAMINOPHEN 5-325 MG PO TABS
2.0000 | ORAL_TABLET | Freq: Once | ORAL | Status: AC
  Administered 2012-07-09: 2 via ORAL
  Filled 2012-07-09: qty 2

## 2012-07-09 MED ORDER — SODIUM CHLORIDE 0.9 % IV BOLUS (SEPSIS)
1000.0000 mL | Freq: Once | INTRAVENOUS | Status: AC
  Administered 2012-07-09: 1000 mL via INTRAVENOUS

## 2012-07-09 NOTE — ED Notes (Signed)
Pt sts a cough since she stopped smoking, x 1 week.  Sts severe back pain, since she began taking new birth control pill x 1 month.  Pain score 10/10.  Sts severe lower abdominal pain and n/v/d started this AM.  Pain score 8/10.

## 2012-07-09 NOTE — ED Notes (Signed)
Pt has had a cough for a week but today started c/o NVD.

## 2012-07-09 NOTE — ED Provider Notes (Signed)
History     CSN: [id number]  Arrival date & time 07/09/12  1640   First MD Initiated Contact with Patient 07/09/12 1713      Chief Complaint  Patient presents with  . Cough  . Nausea  . Emesis  . Diarrhea    (Consider location/radiation/quality/duration/timing/severity/associated sxs/prior treatment) HPI Patient complaining of nausea, vomiting, and diarrhea many times today.  Chills and subjective fever.  Diffuse crampy abdominal pain.  Unable to keep fluids down today.  LMP 10/5, on oral bcp.  Increased frequency of urination, no dysuria.  No recent utis.  G1p1.  Patient with some mucousy brown vaginal discharge.  Denies any symptoms like prior gc or trich.   Past Medical History  Diagnosis Date  . Hypothyroidism   . History of chicken pox   . Dysmenorrhea   . Obese   . Bacterial infection   . Trichomonas   . Gonorrhea     Past Surgical History  Procedure Date  . Adenoidectomy   . Wisdom tooth extraction     Family History  Problem Relation Age of Onset  . Arthritis Maternal Aunt   . Diabetes Maternal Uncle   . Hypertension Maternal Grandmother   . Cancer Maternal Grandfather     PROSTATE    History  Substance Use Topics  . Smoking status: Former Smoker -- 0.5 packs/day    Quit date: 06/08/2012  . Smokeless tobacco: Never Used  . Alcohol Use: Yes    OB History    Grav Para Term Preterm Abortions TAB SAB Ect Mult Living   1 1 1       1       Review of Systems  Constitutional: Positive for fever and chills.  HENT: Negative.   Eyes: Negative.   Respiratory: Positive for cough.   Cardiovascular: Negative.   Gastrointestinal: Positive for abdominal pain.  Genitourinary: Positive for frequency. Negative for dysuria.  Musculoskeletal: Negative.   Skin: Negative.   Neurological: Negative.   Hematological: Negative.   Psychiatric/Behavioral: Negative.     Allergies  Review of patient's allergies indicates no known allergies.  Home Medications    Current Outpatient Rx  Name  Route  Sig  Dispense  Refill  . VICKS VAPORUB 4.7-1.2-2.6 % EX OINT   Apply externally   Apply 1 application topically as needed. Congestion         . IBUPROFEN 200 MG PO TABS   Oral   Take 200 mg by mouth every 6 (six) hours as needed. Pain         . LEVONORGESTREL-ETHINYL ESTRAD 90-20 MCG PO TABS   Oral   Take 1 tablet by mouth daily.           BP 114/65  Pulse 88  Temp 98.2 F (36.8 C) (Oral)  Ht 5\' 10"  (1.778 m)  Wt 241 lb (109.317 kg)  BMI 34.58 kg/m2  SpO2 96%  LMP 05/17/2012  Physical Exam  Nursing note and vitals reviewed. Constitutional: She is oriented to person, place, and time. She appears well-developed and well-nourished.  HENT:  Head: Normocephalic and atraumatic.  Eyes: Conjunctivae normal are normal. Pupils are equal, round, and reactive to light.  Neck: Normal range of motion. Neck supple.  Cardiovascular: Normal rate, regular rhythm and normal heart sounds.   Pulmonary/Chest: Effort normal and breath sounds normal.  Abdominal: Soft.  Musculoskeletal: Normal range of motion.  Neurological: She is alert and oriented to person, place, and time.  Skin: Skin is warm and  dry.  Psychiatric: She has a normal mood and affect.    ED Course  Procedures (including critical care time)   Labs Reviewed  URINALYSIS, ROUTINE W REFLEX MICROSCOPIC   No results found.   No diagnosis found.    MDM  9:28 PM Patient has taken po without vomiting.  Feels some back pain.  Plan abx as urine is le positive with frequency.  Results for orders placed during the hospital encounter of 07/09/12  URINALYSIS, ROUTINE W REFLEX MICROSCOPIC      Component Value Range   Color, Urine YELLOW  YELLOW   APPearance CLOUDY (*) CLEAR   Specific Gravity, Urine 1.023  1.005 - 1.030   pH 7.5  5.0 - 8.0   Glucose, UA NEGATIVE  NEGATIVE mg/dL   Hgb urine dipstick SMALL (*) NEGATIVE   Bilirubin Urine NEGATIVE  NEGATIVE   Ketones, ur  NEGATIVE  NEGATIVE mg/dL   Protein, ur NEGATIVE  NEGATIVE mg/dL   Urobilinogen, UA 1.0  0.0 - 1.0 mg/dL   Nitrite NEGATIVE  NEGATIVE   Leukocytes, UA SMALL (*) NEGATIVE  POCT PREGNANCY, URINE      Component Value Range   Preg Test, Ur NEGATIVE  NEGATIVE  CBC WITH DIFFERENTIAL      Component Value Range   WBC 6.6  4.0 - 10.5 K/uL   RBC 4.74  3.87 - 5.11 MIL/uL   Hemoglobin 14.2  12.0 - 15.0 g/dL   HCT 29.5  62.1 - 30.8 %   MCV 85.2  78.0 - 100.0 fL   MCH 30.0  26.0 - 34.0 pg   MCHC 35.1  30.0 - 36.0 g/dL   RDW 65.7  84.6 - 96.2 %   Platelets 206  150 - 400 K/uL   Neutrophils Relative 89 (*) 43 - 77 %   Neutro Abs 5.9  1.7 - 7.7 K/uL   Lymphocytes Relative 5 (*) 12 - 46 %   Lymphs Abs 0.3 (*) 0.7 - 4.0 K/uL   Monocytes Relative 6  3 - 12 %   Monocytes Absolute 0.4  0.1 - 1.0 K/uL   Eosinophils Relative 1  0 - 5 %   Eosinophils Absolute 0.1  0.0 - 0.7 K/uL   Basophils Relative 0  0 - 1 %   Basophils Absolute 0.0  0.0 - 0.1 K/uL  COMPREHENSIVE METABOLIC PANEL      Component Value Range   Sodium 137  135 - 145 mEq/L   Potassium 3.8  3.5 - 5.1 mEq/L   Chloride 102  96 - 112 mEq/L   CO2 23  19 - 32 mEq/L   Glucose, Bld 84  70 - 99 mg/dL   BUN 6  6 - 23 mg/dL   Creatinine, Ser 9.52  0.50 - 1.10 mg/dL   Calcium 9.5  8.4 - 84.1 mg/dL   Total Protein 7.6  6.0 - 8.3 g/dL   Albumin 3.5  3.5 - 5.2 g/dL   AST 17  0 - 37 U/L   ALT 12  0 - 35 U/L   Alkaline Phosphatase 57  39 - 117 U/L   Total Bilirubin 0.7  0.3 - 1.2 mg/dL   GFR calc non Af Amer >90  >90 mL/min   GFR calc Af Amer >90  >90 mL/min  PREGNANCY, URINE      Component Value Range   Preg Test, Ur NEGATIVE  NEGATIVE  URINE MICROSCOPIC-ADD ON      Component Value Range   Squamous Epithelial /  LPF MANY (*) RARE   WBC, UA 0-2  <3 WBC/hpf   RBC / HPF 0-2  <3 RBC/hpf   Bacteria, UA FEW (*) RARE   Urine-Other MUCOUS PRESENT       Hilario Quarry, MD 07/09/12 [phone]

## 2012-07-09 NOTE — ED Notes (Signed)
Bed:WA20<BR> Expected date:<BR> Expected time:<BR> Means of arrival:<BR> Comments:<BR> Triage 2

## 2012-07-14 ENCOUNTER — Emergency Department (HOSPITAL_COMMUNITY): Payer: Medicaid Other

## 2012-07-14 ENCOUNTER — Emergency Department (HOSPITAL_COMMUNITY)
Admission: EM | Admit: 2012-07-14 | Discharge: 2012-07-15 | Disposition: A | Discharge status: Home / Self Care | Payer: Medicaid Other | Attending: Emergency Medicine | Admitting: Emergency Medicine

## 2012-07-14 ENCOUNTER — Encounter (HOSPITAL_COMMUNITY): Payer: Self-pay | Admitting: *Deleted

## 2012-07-14 DIAGNOSIS — B9789 Other viral agents as the cause of diseases classified elsewhere: Secondary | ICD-10-CM | POA: Insufficient documentation

## 2012-07-14 DIAGNOSIS — E669 Obesity, unspecified: Secondary | ICD-10-CM | POA: Insufficient documentation

## 2012-07-14 DIAGNOSIS — Z87891 Personal history of nicotine dependence: Secondary | ICD-10-CM | POA: Insufficient documentation

## 2012-07-14 DIAGNOSIS — B349 Viral infection, unspecified: Secondary | ICD-10-CM

## 2012-07-14 DIAGNOSIS — N946 Dysmenorrhea, unspecified: Secondary | ICD-10-CM | POA: Insufficient documentation

## 2012-07-14 DIAGNOSIS — J45901 Unspecified asthma with (acute) exacerbation: Secondary | ICD-10-CM | POA: Insufficient documentation

## 2012-07-14 DIAGNOSIS — R1110 Vomiting, unspecified: Secondary | ICD-10-CM | POA: Insufficient documentation

## 2012-07-14 DIAGNOSIS — Z8619 Personal history of other infectious and parasitic diseases: Secondary | ICD-10-CM | POA: Insufficient documentation

## 2012-07-14 DIAGNOSIS — E039 Hypothyroidism, unspecified: Secondary | ICD-10-CM | POA: Insufficient documentation

## 2012-07-14 DIAGNOSIS — Z791 Long term (current) use of non-steroidal anti-inflammatories (NSAID): Secondary | ICD-10-CM | POA: Insufficient documentation

## 2012-07-14 MED ORDER — ALBUTEROL SULFATE (5 MG/ML) 0.5% IN NEBU
5.0000 mg | INHALATION_SOLUTION | RESPIRATORY_TRACT | Status: DC
  Administered 2012-07-14: 5 mg via RESPIRATORY_TRACT
  Filled 2012-07-14: qty 1
  Filled 2012-07-14: qty 2.5

## 2012-07-14 MED ORDER — IPRATROPIUM BROMIDE 0.02 % IN SOLN
0.5000 mg | RESPIRATORY_TRACT | Status: AC
  Administered 2012-07-14: 0.5 mg via RESPIRATORY_TRACT
  Filled 2012-07-14: qty 2.5

## 2012-07-14 MED ORDER — ONDANSETRON HCL 4 MG/2ML IJ SOLN
4.0000 mg | Freq: Once | INTRAMUSCULAR | Status: AC
  Administered 2012-07-14: 4 mg via INTRAVENOUS
  Filled 2012-07-14: qty 2

## 2012-07-14 MED ORDER — KETOROLAC TROMETHAMINE 30 MG/ML IJ SOLN
15.0000 mg | Freq: Once | INTRAMUSCULAR | Status: AC
  Administered 2012-07-14: 15 mg via INTRAVENOUS
  Filled 2012-07-14: qty 1

## 2012-07-14 MED ORDER — LACTATED RINGERS IV BOLUS (SEPSIS)
1000.0000 mL | Freq: Once | INTRAVENOUS | Status: AC
  Administered 2012-07-14: 1000 mL via INTRAVENOUS

## 2012-07-14 NOTE — ED Provider Notes (Signed)
History   This chart was scribed for Gwendolyn Skene, MD by Gerlean Ren, ED Scribe. This patient was seen in room TR09C/TR09C and the patient's care was started at 11:00 PM    CSN: [id number]  Arrival date & time 07/14/12  2141   First MD Initiated Contact with Patient 07/14/12 2234      Chief Complaint  Patient presents with  . Shortness of Breath  . Cough    The history is provided by the patient. No language interpreter was used.   Gwendolyn Clay is a 26 y.o. female with h/o hypothyroidism who presents to the Emergency Department complaining of one month of constant, non-productive cough with associated dyspnea, congestion and aching chest pain secondary to coughs.  Pt reports one week of new nausea, non-bloody, non-bilious emesis, HA and chills but is unsure of fever.  Pt has not been able to keep OTC meds down due to emesis.  Pt states family h/o asthma.  Pt denies rash and myalgias.  Pt finished Septra regiment for UTI today.  Pt has not gotten flu shot this year. Pt stopped smoking in October.  Past Medical History  Diagnosis Date  . Hypothyroidism   . History of chicken pox   . Dysmenorrhea   . Obese   . Bacterial infection   . Trichomonas   . Gonorrhea     Past Surgical History  Procedure Date  . Adenoidectomy   . Wisdom tooth extraction     Family History  Problem Relation Age of Onset  . Arthritis Maternal Aunt   . Diabetes Maternal Uncle   . Hypertension Maternal Grandmother   . Cancer Maternal Grandfather     PROSTATE    History  Substance Use Topics  . Smoking status: Former Smoker -- 0.5 packs/day    Quit date: 06/08/2012  . Smokeless tobacco: Never Used  . Alcohol Use: Yes    OB History    Grav Para Term Preterm Abortions TAB SAB Ect Mult Living   1 1 1       1       Review of Systems At least 10pt or greater review of systems completed and are negative except where specified in the HPI.  Allergies  Review of patient's allergies  indicates no known allergies.  Home Medications   Current Outpatient Rx  Name  Route  Sig  Dispense  Refill  . VICKS VAPORUB 4.7-1.2-2.6 % EX OINT   Apply externally   Apply 1 application topically as needed. Congestion         . IBUPROFEN 200 MG PO TABS   Oral   Take 200 mg by mouth every 6 (six) hours as needed. Pain         . LEVONORGESTREL-ETHINYL ESTRAD 90-20 MCG PO TABS   Oral   Take 1 tablet by mouth daily.         Marland Kitchen ONDANSETRON HCL 4 MG PO TABS   Oral   Take 1 tablet (4 mg total) by mouth every 6 (six) hours.   12 tablet   0   . SULFAMETHOXAZOLE-TRIMETHOPRIM 800-160 MG PO TABS   Oral   Take 1 tablet by mouth every 12 (twelve) hours.   10 tablet   0     BP 111/76  Pulse 87  Temp 98.8 F (37.1 C) (Oral)  Resp 20  SpO2 97%  LMP 05/17/2012  Physical Exam  Nursing notes reviewed.  Electronic medical record reviewed. VITAL SIGNS:   Filed Vitals:  07/14/12 2146  BP: 111/76  Pulse: 87  Temp: 98.8 F (37.1 C)  TempSrc: Oral  Resp: 20  SpO2: 97%   CONSTITUTIONAL: Awake, oriented, appears non-toxic HENT: Atraumatic, normocephalic, oral mucosa pink and moist, airway patent. Pharynx is mildly erythematous. Nares patent with mild clear drainage, turbinates are boggy. External ears normal. EYES: Conjunctiva mildly hazy, EOMI, PERRLA NECK: Trachea midline, non-tender, supple CARDIOVASCULAR: Normal heart rate, Normal rhythm, No murmurs, rubs, gallops PULMONARY/CHEST: Fair air movement, wheezing bilaterally, no rales or rhonchi. Non-tender. ABDOMINAL: Non-distended, obese, soft, non-tender - no rebound or guarding.  BS normal. NEUROLOGIC: Non-focal, moving all four extremities, no gross sensory or motor deficits. EXTREMITIES: No clubbing, cyanosis, or edema SKIN: Warm, Dry, No erythema, No rash  ED Course  Procedures (including critical care time) .DIAGNOSTIC STUDIES: Oxygen Saturation is 97% on room air, adequate by my interpretation.     COORDINATION OF CARE: 11:09 PM- Patient informed of clinical course, understands medical decision-making process, and agrees with plan.  Labs Reviewed - No data to display Dg Chest 2 View  07/14/2012  *RADIOLOGY REPORT*  Clinical Data: Shortness of breath.  Nonproductive cough.  Fever.  CHEST - 2 VIEW  Comparison:  None.  Findings:  The heart size and mediastinal contours are within normal limits.  Both lungs are clear.  The visualized skeletal structures are unremarkable.  IMPRESSION: No active cardiopulmonary disease.   Original Report Authenticated By: Myles Rosenthal, M.D.      1. Viral syndrome   2. Asthma exacerbation       MDM  Gwendolyn Clay is a 26 y.o. female with family history of asthma presenting with cough congestion x1 month, posttussive emesis and shortness of breath. Suspicion is reactive airway disease secondary to virus, rule out pneumonia with chest x-ray, she is wheezing on exam we'll treat with nebulized beta-2 agonists. Do not think her shortness of breath is secondary to heart failure or PE. Patient is perc negative. Doubt influenza.  Patient feeling much better after first neb treatment, lungs are sounding much better, we'll treat again.  Patient feels much better and is ready for discharge after a second albuterol treatment. We'll send patient home with asthma exacerbation medications including prednisone and an albuterol inhaler plus spacer. She is encouraged to follow up with her primary care physician to manage her asthma long-term. Discussed possibility of having influenza with patient and she would not want to be treated with antivirals.  I explained the diagnosis and have given explicit precautions to return to the ER including worsening shortness of breath or any other new or worsening symptoms. The patient understands and accepts the medical plan as it's been dictated and I have answered their questions. Discharge instructions concerning home care and  prescriptions have been given.  The patient is STABLE and is discharged to home in good condition.    I personally performed the services described in this documentation, which was scribed in my presence. The recorded information has been reviewed and is accurate. Gwendolyn Clay, M.D.           Gwendolyn Skene, MD 07/15/12 0130

## 2012-07-14 NOTE — ED Notes (Signed)
Pt states the cough has been here for a month; pt mentating appropriately. Pt states dizziness and lightheadedness; pt states she feels really hot.

## 2012-07-14 NOTE — ED Notes (Signed)
Pt in X ray

## 2012-07-14 NOTE — ED Notes (Signed)
Pt in c/o cough and congestion x1 month, pt states coughing is making her vomit now, increased shortness of breath. Pt states she has been coughing up brown sputum but that has resolved at this time and cough is dry, unsure of fever.

## 2012-07-15 MED ORDER — PREDNISONE 20 MG PO TABS
60.0000 mg | ORAL_TABLET | Freq: Once | ORAL | Status: AC
  Administered 2012-07-15: 60 mg via ORAL
  Filled 2012-07-15: qty 3

## 2012-07-15 MED ORDER — AEROCHAMBER PLUS W/MASK MISC
1.0000 | Freq: Once | Status: AC
  Administered 2012-07-15: 1

## 2012-07-15 MED ORDER — PREDNISONE 20 MG PO TABS: 60.0000 mg | ORAL_TABLET | Freq: Every day | ORAL | Status: DC

## 2012-07-15 MED ORDER — ALBUTEROL SULFATE HFA 108 (90 BASE) MCG/ACT IN AERS
2.0000 | INHALATION_SPRAY | RESPIRATORY_TRACT | Status: DC
  Administered 2012-07-15: 2 via RESPIRATORY_TRACT
  Filled 2012-07-15: qty 6.7

## 2012-07-15 MED ORDER — ALBUTEROL SULFATE (5 MG/ML) 0.5% IN NEBU
5.0000 mg | INHALATION_SOLUTION | Freq: Once | RESPIRATORY_TRACT | Status: AC
  Administered 2012-07-15: 5 mg via RESPIRATORY_TRACT

## 2012-07-15 MED ORDER — ONDANSETRON 4 MG PO TBDP: 4.0000 mg | ORAL_TABLET | Freq: Three times a day (TID) | ORAL | Status: DC | PRN

## 2012-07-15 MED ORDER — AEROCHAMBER PLUS W/MASK MISC
Status: AC
  Filled 2012-07-15: qty 1

## 2012-07-28 ENCOUNTER — Emergency Department (HOSPITAL_COMMUNITY)
Admission: EM | Admit: 2012-07-28 | Discharge: 2012-07-28 | Disposition: A | Discharge status: Nursing Home (Includes ICF) | Payer: Medicaid Other | Source: Home / Self Care | Attending: Emergency Medicine | Admitting: Emergency Medicine

## 2012-07-28 ENCOUNTER — Encounter (HOSPITAL_COMMUNITY): Payer: Self-pay | Admitting: Emergency Medicine

## 2012-07-28 ENCOUNTER — Encounter (HOSPITAL_COMMUNITY): Payer: Self-pay | Admitting: Nurse Practitioner

## 2012-07-28 ENCOUNTER — Observation Stay (HOSPITAL_COMMUNITY)
Admission: EM | Admit: 2012-07-28 | Discharge: 2012-07-28 | Disposition: A | Discharge status: Home / Self Care | Payer: Medicaid Other | Attending: Emergency Medicine | Admitting: Emergency Medicine

## 2012-07-28 ENCOUNTER — Observation Stay (HOSPITAL_COMMUNITY): Payer: Medicaid Other

## 2012-07-28 DIAGNOSIS — R509 Fever, unspecified: Secondary | ICD-10-CM | POA: Insufficient documentation

## 2012-07-28 DIAGNOSIS — E039 Hypothyroidism, unspecified: Secondary | ICD-10-CM | POA: Insufficient documentation

## 2012-07-28 DIAGNOSIS — M549 Dorsalgia, unspecified: Secondary | ICD-10-CM

## 2012-07-28 DIAGNOSIS — R109 Unspecified abdominal pain: Secondary | ICD-10-CM | POA: Insufficient documentation

## 2012-07-28 DIAGNOSIS — R0602 Shortness of breath: Secondary | ICD-10-CM | POA: Insufficient documentation

## 2012-07-28 DIAGNOSIS — R05 Cough: Secondary | ICD-10-CM

## 2012-07-28 DIAGNOSIS — E860 Dehydration: Secondary | ICD-10-CM

## 2012-07-28 DIAGNOSIS — R42 Dizziness and giddiness: Secondary | ICD-10-CM | POA: Insufficient documentation

## 2012-07-28 DIAGNOSIS — R079 Chest pain, unspecified: Secondary | ICD-10-CM | POA: Insufficient documentation

## 2012-07-28 DIAGNOSIS — J189 Pneumonia, unspecified organism: Principal | ICD-10-CM | POA: Insufficient documentation

## 2012-07-28 DIAGNOSIS — D72819 Decreased white blood cell count, unspecified: Secondary | ICD-10-CM | POA: Insufficient documentation

## 2012-07-28 HISTORY — DX: Unspecified asthma, uncomplicated: J45.909

## 2012-07-28 LAB — CBC WITH DIFFERENTIAL/PLATELET
Basophils Absolute: 0 10*3/uL (ref 0.0–0.1)
Basophils Relative: 1 % (ref 0–1)
Eosinophils Absolute: 0 10*3/uL (ref 0.0–0.7)
Hemoglobin: 13.9 g/dL (ref 12.0–15.0)
MCH: 29.4 pg (ref 26.0–34.0)
MCHC: 34.6 g/dL (ref 30.0–36.0)
Monocytes Relative: 10 % (ref 3–12)
Neutrophils Relative %: 56 % (ref 43–77)
RDW: 14 % (ref 11.5–15.5)

## 2012-07-28 LAB — COMPREHENSIVE METABOLIC PANEL
Albumin: 3.1 g/dL — ABNORMAL LOW (ref 3.5–5.2)
Alkaline Phosphatase: 57 U/L (ref 39–117)
BUN: 6 mg/dL (ref 6–23)
Creatinine, Ser: 0.71 mg/dL (ref 0.50–1.10)
Potassium: 4 mEq/L (ref 3.5–5.1)
Total Protein: 7 g/dL (ref 6.0–8.3)

## 2012-07-28 LAB — URINALYSIS, ROUTINE W REFLEX MICROSCOPIC
Glucose, UA: NEGATIVE mg/dL
Leukocytes, UA: NEGATIVE
pH: 6 (ref 5.0–8.0)

## 2012-07-28 LAB — WET PREP, GENITAL: Yeast Wet Prep HPF POC: NONE SEEN

## 2012-07-28 MED ORDER — SODIUM CHLORIDE 0.9 % IV SOLN
INTRAVENOUS | Status: DC
  Administered 2012-07-28: 15:00:00 via INTRAVENOUS

## 2012-07-28 MED ORDER — AZITHROMYCIN 250 MG PO TABS
500.0000 mg | ORAL_TABLET | Freq: Once | ORAL | Status: AC
  Administered 2012-07-28: 500 mg via ORAL
  Filled 2012-07-28: qty 2

## 2012-07-28 MED ORDER — KETOROLAC TROMETHAMINE 30 MG/ML IJ SOLN
30.0000 mg | Freq: Once | INTRAMUSCULAR | Status: AC
  Administered 2012-07-28: 30 mg via INTRAVENOUS
  Filled 2012-07-28: qty 1

## 2012-07-28 MED ORDER — AZITHROMYCIN 250 MG PO TABS: 250.0000 mg | ORAL_TABLET | Freq: Every day | ORAL | Status: DC

## 2012-07-28 MED ORDER — IOHEXOL 350 MG/ML SOLN
80.0000 mL | Freq: Once | INTRAVENOUS | Status: AC | PRN
  Administered 2012-07-28: 80 mL via INTRAVENOUS

## 2012-07-28 MED ORDER — ALBUTEROL SULFATE HFA 108 (90 BASE) MCG/ACT IN AERS
2.0000 | INHALATION_SPRAY | Freq: Once | RESPIRATORY_TRACT | Status: AC
  Administered 2012-07-28: 2 via RESPIRATORY_TRACT
  Filled 2012-07-28: qty 6.7

## 2012-07-28 MED ORDER — AEROCHAMBER PLUS W/MASK MISC
1.0000 | Freq: Once | Status: AC
  Administered 2012-07-28: 1
  Filled 2012-07-28: qty 1

## 2012-07-28 NOTE — ED Provider Notes (Signed)
Care assumed from Dr. Freida Busman and Dr. Bebe Shaggy. Patient moved to the CDU to obtain CTA of her chest to rule out pulmonary embolism. CT showing patchy, multifocal interstitial and alveolar pneumonia involving all lobes bilaterally. Currently patient is comfortable and resting in bed in no apparent distress. She feels as if she is not wheezing as much at this time. She feels as if she is stable enough to go home. Patient will be walked and her O2 sat will be checked prior to discharge. On exam patient is in no apparent distress. HEENT negative. Heart RRR. Lungs with scattered wheezes and rhonchi. AAO x3. She'll be discharged with azithromycin and the resource list to followup with a primary care provider. Case has been discussed with Dr. Bebe Shaggy who agrees with plan of care. 9:28 PM Patient's O2 sat dropped to 80-84 while ambulating and patient felt tired. Patient is going to try to find a babysitter for her daughter to stay overnight admitted to observation. If she cannot find a sitter, she will go home. She is aware of the risks of going home. Dr. Bebe Shaggy aware. 9:46 PM Patient cannot find a babysitter and needs to be discharged. She'll be discharged with azithromycin and albuterol inhaler. Very close return precautions have been discussed. Patient states her understanding of these precautions. .  Dg Chest 2 View  07/14/2012  *RADIOLOGY REPORT*  Clinical Data: Shortness of breath.  Nonproductive cough.  Fever.  CHEST - 2 VIEW  Comparison:  None.  Findings:  The heart size and mediastinal contours are within normal limits.  Both lungs are clear.  The visualized skeletal structures are unremarkable.  IMPRESSION: No active cardiopulmonary disease.   Original Report Authenticated By: Myles Rosenthal, M.D.    Ct Angio Chest Pe W/cm &/or Wo Cm  07/28/2012  *RADIOLOGY REPORT*  Clinical Data: Chest pain, shortness of breath, elevated D-dimer, wheezing and shortness of breath.  CT ANGIOGRAPHY CHEST  Technique:   Multidetector CT imaging of the chest using the standard protocol during bolus administration of intravenous contrast. Multiplanar reconstructed images including MIPs were obtained and reviewed to evaluate the vascular anatomy.  Contrast: OMNIPAQUE IOHEXOL 350 MG/ML SOLN  Comparison: Radiographs dated 07/14/2012.  Findings: The patient has a duplicated superior vena cava.  Mildly enlarged mediastinal and bilateral hilar lymph nodes.  These include a 14 mm short axis right hilar lymph node on image number 53, 8 mm short axis left hilar lymph node on image number 58, 12 mm short-axis subcarinal lymph node on image number 50 and 10 mm right anterior paratracheal lymph node on image number 37.  Normally opacified pulmonary arteries with no pulmonary arterial filling defects seen.  Patchy alveolar and interstitial opacities in both upper lobes, both lower lobes and in the right middle lobe. This is multifocal.  No pleural fluid.  Minimal thoracic spine degenerative changes.  Unremarkable upper abdomen.  IMPRESSION:  1. Patchy, multi focal interstitial and alveolar pneumonia or pneumonitis, involving all lobes bilaterally. 2.  Mild mediastinal and bilateral hilar adenopathy, most likely reactive. 3.  No pulmonary emboli. 4.  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   Original Report Authenticated By: Beckie Salts, M.D.     Trevor Mace, PA-C 07/28/12 2147

## 2012-07-28 NOTE — ED Notes (Signed)
[facility] called. 

## 2012-07-28 NOTE — ED Notes (Signed)
These VS done first at triage @ 1355

## 2012-07-28 NOTE — ED Notes (Signed)
Pt O2sat dropped to 80% on Room Air during ambulation and raised back up to 97% on Room Air after sitting back down for a minute.

## 2012-07-28 NOTE — ED Notes (Signed)
While starting IV pt. Informs me she has had urinary frequency this AM every 30 minutes.

## 2012-07-28 NOTE — ED Notes (Signed)
IV started with 20 g angiocath in R AC @ 150cc/hr.

## 2012-07-28 NOTE — ED Notes (Signed)
1500  Report given to [facility].

## 2012-07-28 NOTE — ED Notes (Signed)
Per [facility] pt came to [facility] today for wheezing (dx asthma) and lower abd and back pain- has been recently tx for UTI. Pt initial BP 76/50 HR 116 100%

## 2012-07-28 NOTE — ED Provider Notes (Signed)
History     CSN: [id number]  Arrival date & time 07/28/12  1523   First MD Initiated Contact with Patient 07/28/12 1600     Chief Complaint  Patient presents with  . Back Pain   HPI: Ms. Gwendolyn Clay is a 26 yo AAF with history of hypothyroidism who presents with several complaints including chest pain, shortness of breath, intermittent dizziness, back pain and lower abdominal pain. Symptoms have been ongoing for several weeks. Started with cough that was initially productive of clear phlegm but is now non-productive. She also endorses chest pain that she describes as aching, worse with inspiration, non-radiating, moderate, associated with tactile fever and URI symptoms. Evaluated at [facility] on Nov 27 with CXR which was negative for PNA but she was wheezing at that time. Treated with steroids and albuterol with some improvement of symptoms but never resolved. She presented again Dec. 2 and again treated with bronchodilators. Symptoms have been persistent. Two days ago she developed low back pain described as aching, no associated with compression. Lower abdominal is described as soreness, not radiating, worsened with coughing, relieved with rest, no vaginal bleeding or discharge. She was again evaluated at [facility], there she was hypotensive and tachycardic so sent to our ED for further evaluation.    Past Medical History  Diagnosis Date  . Hypothyroidism   . History of chicken pox   . Dysmenorrhea   . Obese   . Bacterial infection   . Trichomonas   . Gonorrhea   . Asthma     Past Surgical History  Procedure Date  . Adenoidectomy   . Wisdom tooth extraction     Family History  Problem Relation Age of Onset  . Arthritis Maternal Aunt   . Diabetes Maternal Uncle   . Hypertension Maternal Grandmother   . Cancer Maternal Grandfather     PROSTATE    History  Substance Use Topics  . Smoking status: Former Smoker -- 0.5 packs/day    Quit date: 06/08/2012  . Smokeless tobacco:  Never Used  . Alcohol Use: Yes     Comment: occasional    OB History    Grav Para Term Preterm Abortions TAB SAB Ect Mult Living   1 1 1       1       Review of Systems  Constitutional: Positive for fever (tactile). Negative for chills.  HENT: Positive for congestion and rhinorrhea. Negative for trouble swallowing, neck pain and neck stiffness.   Eyes: Negative for photophobia and visual disturbance.  Respiratory: Positive for cough, shortness of breath and wheezing (intermittent, none today).   Cardiovascular: Positive for chest pain. Negative for leg swelling.  Gastrointestinal: Positive for abdominal pain. Negative for nausea, vomiting and diarrhea.  Genitourinary: Negative for dysuria, frequency, flank pain, vaginal discharge and vaginal pain.  Musculoskeletal: Positive for back pain. Negative for joint swelling, arthralgias and gait problem.  Skin: Negative for pallor and rash.  Neurological: Positive for dizziness. Negative for syncope, weakness and headaches.  Psychiatric/Behavioral: Negative for confusion and agitation.    Allergies  Review of patient's allergies indicates no known allergies.  Home Medications   Current Outpatient Rx  Name  Route  Sig  Dispense  Refill  . ALBUTEROL SULFATE HFA 108 (90 BASE) MCG/ACT IN AERS   Inhalation   Inhale 2 puffs into the lungs every 4 (four) hours as needed. For asthma         . DIPHENHYDRAMINE HCL 25 MG PO TABS  Oral   Take 25 mg by mouth every 6 (six) hours as needed. For allergies/wheezing         . NAPROXEN SODIUM 220 MG PO TABS   Oral   Take 440 mg by mouth 2 (two) times daily as needed. For pain         . OXYMETAZOLINE HCL 0.05 % NA SOLN   Nasal   Place 3 sprays into the nose 2 (two) times daily as needed. For nasal congesiton         . PREDNISONE 20 MG PO TABS   Oral   Take 3 tablets (60 mg total) by mouth daily.   12 tablet   0     BP 137/105  Pulse 87  Temp 98.4 F (36.9 C) (Oral)  Resp 13   SpO2 99%  LMP 07/09/2012  Physical Exam  Nursing note and vitals reviewed. Constitutional: She is oriented to person, place, and time. She is cooperative. No distress.       Overweight female, well-groomed, pleasant and interactive.   HENT:  Head: Normocephalic and atraumatic.  Mouth/Throat: Oropharynx is clear and moist and mucous membranes are normal.  Eyes: Conjunctivae normal and EOM are normal. Pupils are equal, round, and reactive to light.  Neck: Trachea normal and full passive range of motion without pain. Neck supple. No JVD present.  Cardiovascular: Normal rate, regular rhythm, S1 normal, S2 normal and normal heart sounds.  Exam reveals no decreased pulses.   Pulmonary/Chest: Effort normal. She has no decreased breath sounds. She has no wheezes. She has rhonchi (diffusely). She exhibits tenderness (anterior chest).  Abdominal: Soft. Normal appearance and bowel sounds are normal. There is tenderness in the right lower quadrant, suprapubic area and left lower quadrant. There is no rigidity, no guarding and no CVA tenderness.  Genitourinary: Cervix exhibits no motion tenderness and no friability. Right adnexum displays no tenderness. Left adnexum displays no tenderness.       Small amount of white discharge in the vagina   Musculoskeletal: Normal range of motion. She exhibits no edema.  Neurological: She is alert and oriented to person, place, and time.  Skin: Skin is warm and dry. No rash noted.    ED Course  Procedures  Labs Reviewed  CBC WITH DIFFERENTIAL - Abnormal; Notable for the following:    WBC 3.7 (*)     All other components within normal limits  COMPREHENSIVE METABOLIC PANEL - Abnormal; Notable for the following:    Albumin 3.1 (*)     All other components within normal limits  URINALYSIS, ROUTINE W REFLEX MICROSCOPIC - Abnormal; Notable for the following:    APPearance CLOUDY (*)     Ketones, ur 40 (*)     All other components within normal limits  WET PREP,  GENITAL - Abnormal; Notable for the following:    Clue Cells Wet Prep HPF POC MODERATE (*)     WBC, Wet Prep HPF POC MODERATE (*)     All other components within normal limits  D-DIMER, QUANTITATIVE - Abnormal; Notable for the following:    D-Dimer, Quant 0.79 (*)     All other components within normal limits  PREGNANCY, URINE  GC/CHLAMYDIA PROBE AMP   Ct Angio Chest Pe W/cm &/or Wo Cm  07/28/2012  *RADIOLOGY REPORT*  Clinical Data: Chest pain, shortness of breath, elevated D-dimer, wheezing and shortness of breath.  CT ANGIOGRAPHY CHEST  Technique:  Multidetector CT imaging of the chest using the standard protocol during  bolus administration of intravenous contrast. Multiplanar reconstructed images including MIPs were obtained and reviewed to evaluate the vascular anatomy.  Contrast: OMNIPAQUE IOHEXOL 350 MG/ML SOLN  Comparison: Radiographs dated 07/14/2012.  Findings: The patient has a duplicated superior vena cava.  Mildly enlarged mediastinal and bilateral hilar lymph nodes.  These include a 14 mm short axis right hilar lymph node on image number 53, 8 mm short axis left hilar lymph node on image number 58, 12 mm short-axis subcarinal lymph node on image number 50 and 10 mm right anterior paratracheal lymph node on image number 37.  Normally opacified pulmonary arteries with no pulmonary arterial filling defects seen.  Patchy alveolar and interstitial opacities in both upper lobes, both lower lobes and in the right middle lobe. This is multifocal.  No pleural fluid.  Minimal thoracic spine degenerative changes.  Unremarkable upper abdomen.  IMPRESSION:  1. Patchy, multi focal interstitial and alveolar pneumonia or pneumonitis, involving all lobes bilaterally. 2.  Mild mediastinal and bilateral hilar adenopathy, most likely reactive. 3.  No pulmonary emboli. 4.  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   Original Report Authenticated By: Beckie Salts, M.D.    1. CAP  (community acquired pneumonia)    MDM  26 yo AAF with history of hypothyroidism who presents with several complaints including chest pain, shortness of breath, intermittent dizziness, back pain and lower abdominal pain. Afebrile, vital signs stable in our ED. Concern for PE due to pleuritic chest pain and tachycardia at urgent care, no able to [facility], therefore obtained d-dimer which was elevated, so will obtain CTA of the chest. No ECG findings concerning for ischemia. UA without evidence of infection. WBC 3.7, Hgb 13.9, lytes and cr normal. As for lower abdominal pain, felt this was likely due to coughing as well but to ensure no GYN cause did pelvic which revealed vaginal discharge but no cervical motion or adnexal tenderness. Doubt PID. Wet prep with clue cells, not symptomatic from this standpoint, will not treat. No abdominal imaging warranted as abdomen with mild TTP in lower abdomen, no guarding or other peritoneal signs. Patient treated with IVF's with continued improvement of HR to low 80's, blood pressure remained stable. Transferred to CDU awaiting CTA of chest. Patient in agreement with this plan  Reviewed imaging, labs and previous medical records, utilized in MDM  Discussed case with Dr. Bebe Shaggy  Clinical Impression 1. Chest pain 2. Shortness of breath 3. Leukopenia        Margie Billet, MD 07/29/12 [phone]

## 2012-07-28 NOTE — ED Notes (Signed)
Called to triage to assess pt.  States she was seen in ED 2 weeks ago and diagnosed with asthma.  Finished Prednisone 12/5 but has continued to cough.  States she used her inhaler @ 1415.  O2 sat 100 %.  BP 76/50 sitting P 116.  Took pt. To room 2 to lie down and rechecked BP 108/ 60 P 105.  C/o low abd. pain and low back pain and nausea.  States she had viral gastroenteritis and UTI 11/27 and has finished her antibiotics. Denies UTI symptoms.  Dr. Lorenz Coaster notified.

## 2012-07-28 NOTE — ED Provider Notes (Signed)
Chief Complaint  Patient presents with  . Cough    History of Present Illness:   The patient is a 26 year old female who presents today with coughing, wheezing, and shortness of breath. Her illness dates back to late November, about 3 weeks ago, when she developed nausea, vomiting, abdominal pain. She presented to the [facility] emergency department on November 27 and was diagnosed as having a urinary tract infection and gastroenteritis. These symptoms improved somewhat but thereafter she developed wheezing, shortness of breath, chest tightness, cough productive of brown sputum, aching in her ribs, doing feverish, chilled, nasal congestion, and rhinorrhea. She went to the [facility] emergency department on December 1 where she was diagnosed with asthma. A chest x-ray at that time was normal. She was given prednisone for 5 days and albuterol inhaler. Patient states she never completely got over this. Her symptoms have continued. Over the past 2 or 3 days she's had some soreness in her lower back and also her upper and lower abdomen. She denies any nausea vomiting. She has not had a fever. She has had some urinary frequency and urgency. She denies any dysuria. Her last menstrual period was November 27. The patient states she's not been sexually active in 2 months. She has a history of Graves' disease and hypothyroidism.  Review of Systems:  Other than noted above, the patient denies any of the following symptoms. Systemic:  No fever, chills, sweats, fatigue, myalgias, headache, or anorexia. Eye:  No redness, pain or drainage. ENT:  No earache, nasal congestion, rhinorrhea, sinus pressure, or sore throat. Lungs:  No cough, sputum production, wheezing, shortness of breath.  Cardiovascular:  No chest pain, palpitations, or syncope. GI:  No nausea, vomiting, abdominal pain or diarrhea. GU:  No dysuria, frequency, or hematuria. Skin:  No rash or pruritis.  PMFSH:  Past medical history, family history,  social history, meds, and allergies were reviewed.   Physical Exam:   Vital signs:  BP 76/50  Pulse 116  Temp 98.5 F (36.9 C) (Oral)  Resp 20  SpO2 100%  LMP 05/17/2012 General:  Alert, she appears uncomfortable and is lying flat on the stretcher. Eye:  PERRL, full EOMs.  Lids and conjunctivas were normal. ENT:  TMs and canals were normal, without erythema or inflammation.  Nasal mucosa was clear and uncongested, without drainage.  Mucous membranes were moist.  Pharynx was clear, without exudate or drainage.  There were no oral ulcerations or lesions. Neck:  Supple, no adenopathy, tenderness or mass. Thyroid was normal. Lungs:  No respiratory distress.  She has rales at both bases, there is no wheezes and good air movement. Heart:  Regular rhythm, without gallops, murmers or rubs. Abdomen:  Soft, flat, with mild, generalized tenderness to palpation, without guarding or rebound, and there is no localized tenderness to palpation.  No hepatosplenomagaly or mass. Skin:  Clear, warm, and dry, without rash or lesions.  Course in Urgent Care Center:   She was begun on IV normal saline at 150 mL per hour and will be transferred to the emergency department via [facility].  Assessment:  The primary encounter diagnosis was Cough. Diagnoses of Dehydration, Abdominal pain, Shortness of breath, and Back pain were also pertinent to this visit.  She has many different symptoms, however my initial impression is that she has pneumonia with dehydration. That would explain her cough, shortness of breath, and hypertension. That would not explain her abdominal pain. It may be that her abdominal pain and back pain are  just on the basis of severe cough, but she also could have an abdominal process going on as well. I do not hear any wheezes today, so I cannot be certain about the asthma. I think she will need further workup including blood work, and at least a chest x-ray, and possibly an abdominal CT as well. She  will also need IV hydration.  Plan:   1.  The following meds were prescribed:   New Prescriptions   No medications on file   2.  The patient was transferred to the emergency department via [facility] in stable condition.   Reuben Likes, MD 07/28/12 1430

## 2012-07-29 NOTE — ED Provider Notes (Signed)
I have personally seen and examined the patient.  I have discussed the plan of care with the resident.  I have reviewed the documentation on PMH/FH/Soc. History.  I have reviewed the documentation of the resident and agree.  Pt well appearing, no distress.  Stable in the ED  Joya Gaskins, MD 07/29/12 [phone]

## 2012-07-29 NOTE — ED Provider Notes (Signed)
Medical screening examination/treatment/procedure(s) were conducted as a shared visit with non-physician practitioner(s) and myself.  I personally evaluated the patient during the encounter   Joya Gaskins, MD 07/29/12 [phone]

## 2012-08-01 ENCOUNTER — Encounter (HOSPITAL_COMMUNITY): Payer: Self-pay | Admitting: *Deleted

## 2012-08-01 ENCOUNTER — Observation Stay (HOSPITAL_COMMUNITY)
Admission: EM | Admit: 2012-08-01 | Discharge: 2012-08-03 | Disposition: A | Discharge status: Home / Self Care | Payer: Medicaid Other | Attending: Internal Medicine | Admitting: Internal Medicine

## 2012-08-01 ENCOUNTER — Emergency Department (HOSPITAL_COMMUNITY): Payer: Medicaid Other

## 2012-08-01 DIAGNOSIS — J45902 Unspecified asthma with status asthmaticus: Principal | ICD-10-CM

## 2012-08-01 DIAGNOSIS — L259 Unspecified contact dermatitis, unspecified cause: Secondary | ICD-10-CM | POA: Insufficient documentation

## 2012-08-01 DIAGNOSIS — E039 Hypothyroidism, unspecified: Secondary | ICD-10-CM | POA: Insufficient documentation

## 2012-08-01 DIAGNOSIS — J45909 Unspecified asthma, uncomplicated: Secondary | ICD-10-CM

## 2012-08-01 DIAGNOSIS — E669 Obesity, unspecified: Secondary | ICD-10-CM | POA: Diagnosis present

## 2012-08-01 DIAGNOSIS — M549 Dorsalgia, unspecified: Secondary | ICD-10-CM

## 2012-08-01 DIAGNOSIS — J309 Allergic rhinitis, unspecified: Secondary | ICD-10-CM | POA: Diagnosis present

## 2012-08-01 DIAGNOSIS — Z8639 Personal history of other endocrine, nutritional and metabolic disease: Secondary | ICD-10-CM | POA: Diagnosis present

## 2012-08-01 DIAGNOSIS — L309 Dermatitis, unspecified: Secondary | ICD-10-CM | POA: Diagnosis present

## 2012-08-01 HISTORY — DX: Allergic rhinitis, unspecified: J30.9

## 2012-08-01 HISTORY — DX: Dermatitis, unspecified: L30.9

## 2012-08-01 NOTE — ED Notes (Signed)
Pt reports being diagnosed with CAP on 07/28/12 and prescribed abx, ibuprofen, and an inhaler. States that she has 1 more day of abx, is out of ibuprofen and is out of the inhaler. States that she still is coughing and feels the same. Pt has bilateral expiratory wheezing at this time. Rates back pain when coughing 7/10.

## 2012-08-01 NOTE — ED Notes (Signed)
The pt was diagnosed with pneumonia here on the 16th of this month.  She is not getting any better and she does not think the antibiotics are working

## 2012-08-02 ENCOUNTER — Encounter (HOSPITAL_COMMUNITY): Payer: Self-pay | Admitting: Internal Medicine

## 2012-08-02 DIAGNOSIS — M549 Dorsalgia, unspecified: Secondary | ICD-10-CM

## 2012-08-02 DIAGNOSIS — E669 Obesity, unspecified: Secondary | ICD-10-CM | POA: Diagnosis present

## 2012-08-02 DIAGNOSIS — L259 Unspecified contact dermatitis, unspecified cause: Secondary | ICD-10-CM

## 2012-08-02 DIAGNOSIS — E039 Hypothyroidism, unspecified: Secondary | ICD-10-CM

## 2012-08-02 DIAGNOSIS — L309 Dermatitis, unspecified: Secondary | ICD-10-CM | POA: Diagnosis present

## 2012-08-02 DIAGNOSIS — Z8639 Personal history of other endocrine, nutritional and metabolic disease: Secondary | ICD-10-CM | POA: Diagnosis present

## 2012-08-02 DIAGNOSIS — J309 Allergic rhinitis, unspecified: Secondary | ICD-10-CM | POA: Diagnosis present

## 2012-08-02 DIAGNOSIS — J45902 Unspecified asthma with status asthmaticus: Secondary | ICD-10-CM

## 2012-08-02 DIAGNOSIS — J45909 Unspecified asthma, uncomplicated: Secondary | ICD-10-CM | POA: Insufficient documentation

## 2012-08-02 LAB — BASIC METABOLIC PANEL
CO2: 21 mEq/L (ref 19–32)
Calcium: 9.4 mg/dL (ref 8.4–10.5)
Glucose, Bld: 84 mg/dL (ref 70–99)
Potassium: 3.8 mEq/L (ref 3.5–5.1)
Sodium: 133 mEq/L — ABNORMAL LOW (ref 135–145)

## 2012-08-02 LAB — CBC
MCH: 29.2 pg (ref 26.0–34.0)
MCHC: 35 g/dL (ref 30.0–36.0)
Platelets: 247 10*3/uL (ref 150–400)
RDW: 13.1 % (ref 11.5–15.5)

## 2012-08-02 LAB — CBC WITH DIFFERENTIAL/PLATELET
Eosinophils Relative: 1 % (ref 0–5)
Hemoglobin: 13.3 g/dL (ref 12.0–15.0)
Lymphocytes Relative: 55 % — ABNORMAL HIGH (ref 12–46)
Lymphs Abs: 2.6 10*3/uL (ref 0.7–4.0)
MCV: 83 fL (ref 78.0–100.0)
Monocytes Relative: 8 % (ref 3–12)
Platelets: 261 10*3/uL (ref 150–400)
RBC: 4.59 MIL/uL (ref 3.87–5.11)
WBC: 4.6 10*3/uL (ref 4.0–10.5)

## 2012-08-02 LAB — CREATININE, SERUM: Creatinine, Ser: 0.65 mg/dL (ref 0.50–1.10)

## 2012-08-02 MED ORDER — NAPHAZOLINE-PHENIRAMINE 0.025-0.3 % OP SOLN
1.0000 [drp] | Freq: Four times a day (QID) | OPHTHALMIC | Status: DC | PRN
  Filled 2012-08-02: qty 15

## 2012-08-02 MED ORDER — ALBUTEROL (5 MG/ML) CONTINUOUS INHALATION SOLN
10.0000 mg/h | INHALATION_SOLUTION | Freq: Once | RESPIRATORY_TRACT | Status: AC
  Administered 2012-08-02: 10 mg/h via RESPIRATORY_TRACT
  Filled 2012-08-02: qty 20

## 2012-08-02 MED ORDER — SODIUM CHLORIDE 0.9 % IV SOLN
INTRAVENOUS | Status: DC
  Administered 2012-08-02 – 2012-08-03 (×3): via INTRAVENOUS

## 2012-08-02 MED ORDER — ACETAMINOPHEN 325 MG PO TABS
650.0000 mg | ORAL_TABLET | Freq: Four times a day (QID) | ORAL | Status: DC | PRN
  Administered 2012-08-03: 650 mg via ORAL
  Filled 2012-08-02: qty 2

## 2012-08-02 MED ORDER — PREDNISONE 20 MG PO TABS
60.0000 mg | ORAL_TABLET | Freq: Once | ORAL | Status: AC
  Administered 2012-08-02: 60 mg via ORAL
  Filled 2012-08-02: qty 3

## 2012-08-02 MED ORDER — ONDANSETRON HCL 4 MG/2ML IJ SOLN
4.0000 mg | Freq: Once | INTRAMUSCULAR | Status: AC
  Administered 2012-08-02: 4 mg via INTRAVENOUS
  Filled 2012-08-02: qty 2

## 2012-08-02 MED ORDER — MORPHINE SULFATE 4 MG/ML IJ SOLN
4.0000 mg | Freq: Once | INTRAMUSCULAR | Status: AC
  Administered 2012-08-02: 4 mg via INTRAVENOUS
  Filled 2012-08-02: qty 1

## 2012-08-02 MED ORDER — PANTOPRAZOLE SODIUM 40 MG PO TBEC
80.0000 mg | DELAYED_RELEASE_TABLET | Freq: Every day | ORAL | Status: DC
  Administered 2012-08-02 – 2012-08-03 (×2): 80 mg via ORAL

## 2012-08-02 MED ORDER — OXYCODONE-ACETAMINOPHEN 5-325 MG PO TABS: 1.0000 | ORAL_TABLET | ORAL | Status: DC | PRN

## 2012-08-02 MED ORDER — FLUTICASONE PROPIONATE 50 MCG/ACT NA SUSP
2.0000 | Freq: Every day | NASAL | Status: DC
  Administered 2012-08-02 – 2012-08-03 (×2): 2 via NASAL
  Filled 2012-08-02: qty 16

## 2012-08-02 MED ORDER — FLUTICASONE PROPIONATE HFA 110 MCG/ACT IN AERO
2.0000 | INHALATION_SPRAY | Freq: Two times a day (BID) | RESPIRATORY_TRACT | Status: DC
  Administered 2012-08-02 – 2012-08-03 (×3): 2 via RESPIRATORY_TRACT
  Filled 2012-08-02: qty 12

## 2012-08-02 MED ORDER — ONDANSETRON 4 MG PO TBDP
4.0000 mg | ORAL_TABLET | Freq: Three times a day (TID) | ORAL | Status: DC | PRN
  Filled 2012-08-02: qty 1

## 2012-08-02 MED ORDER — ALBUTEROL SULFATE (5 MG/ML) 0.5% IN NEBU
2.5000 mg | INHALATION_SOLUTION | Freq: Once | RESPIRATORY_TRACT | Status: AC
  Administered 2012-08-02: 2.5 mg via RESPIRATORY_TRACT
  Filled 2012-08-02 (×2): qty 20

## 2012-08-02 MED ORDER — IPRATROPIUM BROMIDE 0.02 % IN SOLN
0.5000 mg | RESPIRATORY_TRACT | Status: DC
  Administered 2012-08-02 (×4): 0.5 mg via RESPIRATORY_TRACT
  Filled 2012-08-02 (×4): qty 2.5

## 2012-08-02 MED ORDER — KETOROLAC TROMETHAMINE 30 MG/ML IJ SOLN
30.0000 mg | Freq: Four times a day (QID) | INTRAMUSCULAR | Status: DC | PRN
  Administered 2012-08-02 – 2012-08-03 (×3): 30 mg via INTRAVENOUS
  Filled 2012-08-02 (×4): qty 1

## 2012-08-02 MED ORDER — ENOXAPARIN SODIUM 40 MG/0.4ML ~~LOC~~ SOLN
40.0000 mg | SUBCUTANEOUS | Status: DC
  Administered 2012-08-02 – 2012-08-03 (×2): 40 mg via SUBCUTANEOUS
  Filled 2012-08-02 (×2): qty 0.4

## 2012-08-02 MED ORDER — SENNOSIDES-DOCUSATE SODIUM 8.6-50 MG PO TABS
1.0000 | ORAL_TABLET | Freq: Every evening | ORAL | Status: DC | PRN
  Filled 2012-08-02: qty 1

## 2012-08-02 MED ORDER — ACETAMINOPHEN 650 MG RE SUPP: 650.0000 mg | Freq: Four times a day (QID) | RECTAL | Status: DC | PRN

## 2012-08-02 MED ORDER — ALBUTEROL SULFATE (5 MG/ML) 0.5% IN NEBU: 2.5000 mg | INHALATION_SOLUTION | RESPIRATORY_TRACT | Status: DC | PRN

## 2012-08-02 MED ORDER — ALBUTEROL SULFATE (5 MG/ML) 0.5% IN NEBU
2.5000 mg | INHALATION_SOLUTION | RESPIRATORY_TRACT | Status: DC
Start: 2012-08-02 — End: 2012-08-03
  Administered 2012-08-02 (×4): 2.5 mg via RESPIRATORY_TRACT
  Filled 2012-08-02 (×4): qty 0.5

## 2012-08-02 MED ORDER — PREDNISONE 50 MG PO TABS
60.0000 mg | ORAL_TABLET | Freq: Every day | ORAL | Status: DC
  Administered 2012-08-03: 60 mg via ORAL
  Filled 2012-08-02 (×3): qty 1

## 2012-08-02 MED ORDER — AZITHROMYCIN 250 MG PO TABS
250.0000 mg | ORAL_TABLET | Freq: Every day | ORAL | Status: DC
  Administered 2012-08-02 – 2012-08-03 (×2): 250 mg via ORAL
  Filled 2012-08-02 (×2): qty 1

## 2012-08-02 MED ORDER — SODIUM CHLORIDE 0.9 % IV BOLUS (SEPSIS)
1000.0000 mL | INTRAVENOUS | Status: AC
  Administered 2012-08-02: 1000 mL via INTRAVENOUS

## 2012-08-02 NOTE — ED Provider Notes (Signed)
History     CSN: [id number]  Arrival date & time 08/01/12  1956   First MD Initiated Contact with Patient 08/02/12 [phone]      Chief Complaint  Patient presents with  . Pneumonia    (Consider location/radiation/quality/duration/timing/severity/associated sxs/prior treatment) HPI Comments: This is a 26 year old female who presents to the ED with a chief complaint of pneumonia.  The patient was seen here on the 16th for the same and discharged on azithromycin.  The patient states that her symptoms have not been improving.  She continues to complain of cough, nausea, vomiting, and low back pain.  The patient has been taking her azithromycin as directed.  She states that her symptoms are worse when she tries to sleep at night.  She denies other health problems.  The history is provided by the patient. No language interpreter was used.    Past Medical History  Diagnosis Date  . Hypothyroidism   . History of chicken pox   . Dysmenorrhea   . Obese   . Bacterial infection   . Trichomonas   . Gonorrhea   . Asthma     Past Surgical History  Procedure Date  . Adenoidectomy   . Wisdom tooth extraction     Family History  Problem Relation Age of Onset  . Arthritis Maternal Aunt   . Diabetes Maternal Uncle   . Hypertension Maternal Grandmother   . Cancer Maternal Grandfather     PROSTATE    History  Substance Use Topics  . Smoking status: Former Smoker -- 0.5 packs/day    Quit date: 06/08/2012  . Smokeless tobacco: Never Used  . Alcohol Use: Yes     Comment: occasional    OB History    Grav Para Term Preterm Abortions TAB SAB Ect Mult Living   1 1 1       1       Review of Systems  Respiratory: Positive for cough, chest tightness and wheezing.   Cardiovascular: Negative for chest pain, palpitations and leg swelling.  Gastrointestinal: Positive for nausea and vomiting. Negative for abdominal pain, diarrhea, constipation and abdominal distention.  Musculoskeletal:  Positive for back pain.  All other systems reviewed and are negative.    Allergies  Review of patient's allergies indicates no known allergies.  Home Medications   Current Outpatient Rx  Name  Route  Sig  Dispense  Refill  . ALBUTEROL SULFATE HFA 108 (90 BASE) MCG/ACT IN AERS   Inhalation   Inhale 2 puffs into the lungs every 4 (four) hours as needed. For asthma         . AZITHROMYCIN 250 MG PO TABS   Oral   Take 1 tablet (250 mg total) by mouth daily.   5 tablet   0   . DIPHENHYDRAMINE HCL 25 MG PO TABS   Oral   Take 25 mg by mouth every 6 (six) hours as needed. For allergies/wheezing         . NAPROXEN SODIUM 220 MG PO TABS   Oral   Take 440 mg by mouth 2 (two) times daily as needed. For pain         . ONDANSETRON 4 MG PO TBDP   Oral   Take 4 mg by mouth every 8 (eight) hours as needed. For nausea         . OXYMETAZOLINE HCL 0.05 % NA SOLN   Nasal   Place 3 sprays into the nose 2 (two) times daily  as needed. For nasal congesiton           BP 102/65  Pulse 96  Temp 98.1 F (36.7 C) (Oral)  Resp 20  SpO2 97%  LMP 07/09/2012  Physical Exam  Nursing note and vitals reviewed. Constitutional: She is oriented to person, place, and time. She appears well-developed and well-nourished.  HENT:  Head: Normocephalic and atraumatic.  Eyes: Conjunctivae normal and EOM are normal. Pupils are equal, round, and reactive to light.  Neck: Normal range of motion. Neck supple.  Cardiovascular: Normal rate and regular rhythm.  Exam reveals no gallop and no friction rub.   No murmur heard. Pulmonary/Chest: Effort normal. No respiratory distress. She has wheezes. She has no rales. She exhibits no tenderness.       Diffuse wheezes heard in bilateral lung fields.  Abdominal: Soft. Bowel sounds are normal. She exhibits no distension and no mass. There is no tenderness. There is no rebound and no guarding.  Musculoskeletal: Normal range of motion. She exhibits tenderness.  She exhibits no edema.       Tenderness to palpation over lumbar paraspinal muscles.  Neurological: She is alert and oriented to person, place, and time.  Skin: Skin is warm and dry.  Psychiatric: She has a normal mood and affect. Her behavior is normal. Judgment and thought content normal.    ED Course  Procedures (including critical care time)   Labs Reviewed  CBC WITH DIFFERENTIAL  BASIC METABOLIC PANEL   Dg Chest 2 View  08/01/2012  *RADIOLOGY REPORT*  Clinical Data: Recent diagnosis of pneumonia.  Cough and congestion.  CHEST - 2 VIEW  Comparison: CT chest 07/28/2012 and PA and lateral chest 07/14/2012.  Findings: Patchy bilateral airspace disease, worse on the right, seen on the prior CT scan has improved.  No pneumothorax or pleural effusion is identified.  Heart size is normal.  IMPRESSION: Improved bilateral airspace disease most consistent with resolving pneumonia.   Original Report Authenticated By: Holley Dexter, M.D.      1. Status asthmaticus       MDM   Patient has had 2 nebulizer treatments.  O2 sats dropped with ambulation 5:51 AM Patient reevaluated with Dr. Lorenso Courier. She's not improved. I will admit the patient.  6:17 AM Patient will be admitted to the hospitalist team 10, Dr. Lovell Sheehan.         Roxy Horseman, PA-C 08/02/12 [phone]

## 2012-08-02 NOTE — ED Notes (Signed)
Ambulated pt and pt's O2 sats dropped to 90% on RA while ambulating, pt became very dizzy as well.

## 2012-08-02 NOTE — ED Notes (Signed)
Continuous nebulizer finished. No needs at this time.

## 2012-08-02 NOTE — H&P (Addendum)
[facility] History and Physical  Gwendolyn Clay ZOX:[id number] DOB: 08/03/1986 DOA: 08/01/2012  Referring physician:  Roxy Horseman PCP: Default, Provider, MD  Needs a primary care doctor.    Chief Complaint: Shortness of breath  HPI:   The patient is a 26-yo female with history of possible asthma, hypothyroidism, and dysmenorrhea who presents with shortness of breath.  Per patient, when she was very little, she had a hospitalization with wheezing which may have been asthma or bronchiolitis, but otherwise, she has been well.  At baseline, she gets more severe chest colds that last several weeks.  In November she was seen in the ER and diagnosed with a viral syndrome and asthma.  She was given a 5-day course of prednisone, however, her wheezing and chest tightness returned so she represented to the ER on 12/16.  At that time, she was diagnosed with pneumonia.  CTa was negative.  She was discharged on azithromycin and albuterol MDI.  She has not had improvement in her shortness of breath.  She returned today because of SOB.  In the ER, she was wheezing.  She was given prednisone 60mg  and nebulizer treatment.  CXR demonstrated resolving pneumonia.  With ambulation, she became lightheaded and her oxygen saturation dropped to 90%.  She is being admitted for status asthmaticus.  She has been doing 4 puffs every 4 hours, which did not help her symptoms very much.   Symptoms are worse at night. The nebulizer treatments in the emergency department have helped.  She has a dry cough with chest pressure.  Denies fevers, chills.  She has baseline sinus congestion and itchy eyes  Asthma baseline: Daytime:  none Nighttime:  none Exercise:  none Albuterol:  At baseline, none Triggers:  Colds Exacerbations in last year:  2, late November and today Hospitalizations:  Possibly 1 as a child  Allergic rhinitis  Review of Systems:  Patient denies fevers, chills, weight loss, night sweats, changes in  hearing and vision.  Baseline rhinorrhea, sinus congestion.  Denies sore throat.  She has cough and wheezing and shortness of breath with some chest pressure.  Endorses low back pain which is chronic.  She has had some stomach pain with nausea and vomiting which is improving.  Denies diarrhea, constipation, lower extremity swelling, ulcer, focal weakness.  She has dry skin and some dry patches on her hands.  Denies dysuria.  Past Medical History  Diagnosis Date  . Hypothyroidism   . History of chicken pox   . Dysmenorrhea   . Obese   . Bacterial infection   . Trichomonas   . Gonorrhea   . Asthma   . Allergic rhinitis     adenoidectomy  . Eczema    Past Surgical History  Procedure Date  . Adenoidectomy   . Wisdom tooth extraction    Social History:  reports that she quit smoking about 7 weeks ago. She has never used smokeless tobacco. She reports that she drinks alcohol. She reports that she does not use illicit drugs. Lives in an apartment with her daughter.  She is a Consulting civil engineer.    No Known Allergies  Family History  Problem Relation Age of Onset  . Arthritis Maternal Aunt   . Diabetes Maternal Uncle   . Hypertension Maternal Grandmother   . Cancer Maternal Grandfather     PROSTATE  . Asthma      neices and nephews  . Allergic rhinitis Brother   . Eczema      neices  and nephews  . Eczema Daughter     Prior to Admission medications   Medication Sig Start Date End Date Taking? Authorizing Provider  albuterol (PROVENTIL HFA;VENTOLIN HFA) 108 (90 BASE) MCG/ACT inhaler Inhale 2 puffs into the lungs every 4 (four) hours as needed. For asthma   Yes Historical Provider, MD  azithromycin (ZITHROMAX Z-PAK) 250 MG tablet Take 1 tablet (250 mg total) by mouth daily. 07/28/12  Yes Trevor Mace, PA-C  diphenhydrAMINE (BENADRYL) 25 MG tablet Take 25 mg by mouth every 6 (six) hours as needed. For allergies/wheezing   Yes Historical Provider, MD  naproxen sodium (ANAPROX) 220 MG tablet  Take 440 mg by mouth 2 (two) times daily as needed. For pain   Yes Historical Provider, MD  ondansetron (ZOFRAN-ODT) 4 MG disintegrating tablet Take 4 mg by mouth every 8 (eight) hours as needed. For nausea   Yes Historical Provider, MD  oxymetazoline (AFRIN) 0.05 % nasal spray Place 3 sprays into the nose 2 (two) times daily as needed. For nasal congesiton   Yes Historical Provider, MD   Physical Exam: Filed Vitals:   08/01/12 2002 08/02/12 0421 08/02/12 0545  BP: 102/65 108/64 106/66  Pulse: 96 105 84  Temp: 98.1 F (36.7 C)    TempSrc: Oral    Resp: 20 16   SpO2: 97% 98% 94%     General:  Overweight to obese AAF, no acute distress, lying on stretcher  Eyes:  PERRL, anicteric, + injection and clear tearing  ENT: Nares with erythematous mucosa and swollen right turbinate, OP nonerythematous without exudate or plaque.  MMM  Neck: supple, no thyromegaly  Lymph:  Shotty cervical chain lymph nodes, no submandibular or supraclavicular LAD  Cardiovascular: RRR, no m/r/g, 2+ pulses, < 2 sec CR, no LEEE  Respiratory:  Crackles and pops, but also diminished at the bases with full expiratory wheeze   Abdomen: NABS, soft, nondistended, nontender, no organomegaly  Skin: Dry skin patches on back of left hand  Musculoskeletal: Normal tone and bulk  Psychiatric: A&Ox4  Neurologic: cranial nerves grossly intact, strength 5/5, sensation intact to light touch  Labs on Admission:  Basic Metabolic Panel:  Lab 08/02/12 1610 07/28/12 1657  NA 133* 136  K 3.8 4.0  CL 101 103  CO2 21 22  GLUCOSE 84 74  BUN 9 6  CREATININE 0.65 0.71  CALCIUM 9.4 8.7  MG -- --  PHOS -- --   Liver Function Tests:  Lab 07/28/12 1657  AST 25  ALT 18  ALKPHOS 57  BILITOT 0.4  PROT 7.0  ALBUMIN 3.1*   No results found for this basename: LIPASE:5,AMYLASE:5 in the last 168 hours No results found for this basename: AMMONIA:5 in the last 168 hours CBC:  Lab 08/02/12 0059 07/28/12 1657  WBC 4.6  3.7*  NEUTROABS 1.6* 2.1  HGB 13.3 13.9  HCT 38.1 40.2  MCV 83.0 85.2  PLT 261 202   Cardiac Enzymes: No results found for this basename: CKTOTAL:5,CKMB:5,CKMBINDEX:5,TROPONINI:5 in the last 168 hours  BNP (last 3 results) No results found for this basename: PROBNP:3 in the last 8760 hours CBG: No results found for this basename: GLUCAP:5 in the last 168 hours  Radiological Exams on Admission: Dg Chest 2 View  08/01/2012  *RADIOLOGY REPORT*  Clinical Data: Recent diagnosis of pneumonia.  Cough and congestion.  CHEST - 2 VIEW  Comparison: CT chest 07/28/2012 and PA and lateral chest 07/14/2012.  Findings: Patchy bilateral airspace disease, worse on the right, seen  on the prior CT scan has improved.  No pneumothorax or pleural effusion is identified.  Heart size is normal.  IMPRESSION: Improved bilateral airspace disease most consistent with resolving pneumonia.   Original Report Authenticated By: Holley Dexter, M.D.     EKG: Independently reviewed.  NSR from 12/16  Assessment/Plan Principal Problem:  *Status asthmaticus Active Problems:  Allergic rhinitis  Eczema  Hypothyroidism  Obese  Back pain  Wheezing, likely due to status asthmaticus triggered by allergies and recent pneumonia.  No evidence of CHF of exam.  May have some bronchiolitic component given pops.  Patient has allergic triad and family history of asthma.   -  Prednisone 60mg  daily -  Duonebs q4h -  Albuterol q2h prn  -  Add flovent -  Patient has follow up with pulmonology in 1-2 weeks already scheduled -  Patient agrees to HIV testing  Allergic rhinitis/conjunctivitis -  Add flonase -  D/C afrin -  Visine eye gtt prn  Back pain, chronic after epidural, but worse with recent illness -  Toradol with tylenol for break through pain -  PPI prophylaxis  Hypothyroid:  asympatomatic -  TSH  DIET:  Regular ACCESS:  PIV IVF:  NS at 167ml/h x 10 h PROPH:  lovenox  Code Status: FULL Family  Communication: spoke with patient alone Disposition Plan: pending improvement in respiratory status, able to ambulate safely without desaturation, to home in 1-2 days  Time spent: 50 min  Renae Fickle [facility] Pager [phone]  If 7PM-7AM, please contact night-coverage [email or web address] Password [facility] 08/02/2012, 8:15 AM

## 2012-08-03 DIAGNOSIS — J45909 Unspecified asthma, uncomplicated: Secondary | ICD-10-CM

## 2012-08-03 DIAGNOSIS — J309 Allergic rhinitis, unspecified: Secondary | ICD-10-CM

## 2012-08-03 LAB — BASIC METABOLIC PANEL
BUN: 11 mg/dL (ref 6–23)
GFR calc non Af Amer: >90 mL/min (ref >90)
Glucose, Bld: 88 mg/dL (ref 70–99)
Potassium: 3.6 mEq/L (ref 3.5–5.1)

## 2012-08-03 MED ORDER — ALBUTEROL SULFATE (5 MG/ML) 0.5% IN NEBU
2.5000 mg | INHALATION_SOLUTION | Freq: Three times a day (TID) | RESPIRATORY_TRACT | Status: DC
  Administered 2012-08-03 (×2): 2.5 mg via RESPIRATORY_TRACT
  Filled 2012-08-03 (×2): qty 0.5

## 2012-08-03 MED ORDER — PREDNISONE 20 MG PO TABS: 40.0000 mg | ORAL_TABLET | Freq: Every day | ORAL | Status: DC

## 2012-08-03 MED ORDER — FLUTICASONE PROPIONATE 50 MCG/ACT NA SUSP: 2.0000 | Freq: Every day | NASAL | Status: DC

## 2012-08-03 MED ORDER — OMEPRAZOLE 40 MG PO CPDR: 40.0000 mg | DELAYED_RELEASE_CAPSULE | Freq: Every day | ORAL | Status: DC

## 2012-08-03 MED ORDER — IPRATROPIUM BROMIDE 0.02 % IN SOLN
0.5000 mg | Freq: Three times a day (TID) | RESPIRATORY_TRACT | Status: DC
  Administered 2012-08-03 (×2): 0.5 mg via RESPIRATORY_TRACT
  Filled 2012-08-03 (×2): qty 2.5

## 2012-08-03 MED ORDER — AZITHROMYCIN 250 MG PO TABS: 250.0000 mg | ORAL_TABLET | Freq: Every day | ORAL | Status: DC

## 2012-08-03 MED ORDER — FLUTICASONE-SALMETEROL 100-50 MCG/DOSE IN AEPB: 1.0000 | INHALATION_SPRAY | Freq: Two times a day (BID) | RESPIRATORY_TRACT | Status: DC

## 2012-08-03 MED ORDER — ALBUTEROL SULFATE (5 MG/ML) 0.5% IN NEBU: 2.5000 mg | INHALATION_SOLUTION | Freq: Three times a day (TID) | RESPIRATORY_TRACT | Status: DC

## 2012-08-03 MED ORDER — IPRATROPIUM BROMIDE 0.02 % IN SOLN: 0.5000 mg | RESPIRATORY_TRACT | Status: DC

## 2012-08-03 NOTE — Progress Notes (Signed)
Patient's  Room air saturation while sitting at bedside was 100% minutes prior to ambulation,amublated,room air saturation dropped to 98%,halfway along the hallway patient"s room air saturation was 97%,as she progressed on until returning to her room sh was able to maintain a 96-98% room air saturation,heartrate from the baseline of 92 prior to ambulation went up to 115-124 beats/min while ambulating then dropped to her baseline after few min after she returned to her room.no complaint or sob,chest pain nor discomfort,patient was coughing non productively three times was she was walking along the hallway.patient said"feel much better today as compare yesterday".

## 2012-08-03 NOTE — Discharge Summary (Signed)
Physician Discharge Summary  Gwendolyn Clay EAV:[id number] DOB: 27-Oct-1985 DOA: 08/01/2012  PCP: Default, Provider, MD  Admit date: 08/01/2012 Discharge date: 08/03/2012  Time spent:54minutes  Recommendations for Outpatient Follow-up:      Follow-up Information    Please follow up. [facility] 1st week of January as scheduled.)          Discharge Diagnoses:  Principal Problem:  *Status asthmaticus Active Problems:  Allergic rhinitis  Eczema  h/o Hypothyroidism  Obese  Back pain   Discharge Condition: improved/stable  Diet recommendation: regular  Filed Weights   08/02/12 1000 08/02/12 2048  Weight: 104 kg (229 lb 4.5 oz) 104 kg (229 lb 4.5 oz)    History of present illness:  The patient is a 26-yo female with history of possible asthma, hypothyroidism, and dysmenorrhea who presents with shortness of breath. Per patient, when she was very little, she had a hospitalization with wheezing which may have been asthma or bronchiolitis, but otherwise, she has been well. At baseline, she gets more severe chest colds that last several weeks. In November she was seen in the ER and diagnosed with a viral syndrome and asthma. She was given a 5-day course of prednisone, however, her wheezing and chest tightness returned so she represented to the ER on 12/16. At that time, she was diagnosed with pneumonia. CTa was negative. She was discharged on azithromycin and albuterol MDI. She has not had improvement in her shortness of breath. She returned today because of SOB. In the ER, she was wheezing. She was given prednisone 60mg  and nebulizer treatment. CXR demonstrated resolving pneumonia. With ambulation, she became lightheaded and her oxygen saturation dropped to 90%. She is being admitted for status asthmaticus. She has been doing 4 puffs every 4 hours, which did not help her symptoms very much. Symptoms are worse at night. The nebulizer treatments in the emergency department have  helped. She has a dry cough with chest pressure. Denies fevers, chills. She has baseline sinus congestion and itchy eyes. She was admitted for further evaluation and management.   Hospital Course:  Probable Asthma Exacerbation -As discussed above, she states that she was only diagnosed with asthma at this year and prior to this she states as a young child her adenoids were removed and following that she was treated with bronchodilators perioperatively but none since then. -On admission chest x-ray showed improved bilateral airspace disease most consistent with a resolving pneumonia -She was maintained on Zithromax and placed on nebulized bronchodilators, Flonase, oral steroids-prednisone -She responded wellto the above interventions and is clinically improved today-no wheezing on lung exam and oxygenating at 96-100% on room air with ambulation. -To be discharged on oral antibiotics, advair, prednisone, albuterol, and is to follow up with the [facility] pulmonology for further evaluation esp. given the above history, and management. Allergic rhinitis -flonase as above, follow up outpt. Procedures:  none  Consultations:  none  Discharge Exam: Filed Vitals:   08/03/12 0000 08/03/12 0520 08/03/12 0740 08/03/12 0856  BP:  106/60 103/51   Pulse:  75 66   Temp:  98.9 F (37.2 C) 97.8 F (36.6 C)   TempSrc:  Oral Oral   Resp:  18 18   Weight:      SpO2: 100% 100% 100% 98%    General: Young female, alert and oriented in no apparent distress. Cardiovascular: Regular rate and rhythm normal S1-S2 Respiratory: Moderate air movement, clear to auscultation with no wheezes. Extremities: No cyanosis and no edema  Discharge Instructions  Discharge Orders    Future Orders Please Complete By Expires   Diet general      Increase activity slowly          Medication List     As of 08/03/2012  2:58 PM    STOP taking these medications         naproxen sodium 220 MG tablet   Commonly known  as: ANAPROX      ondansetron 4 MG disintegrating tablet   Commonly known as: ZOFRAN-ODT      oxymetazoline 0.05 % nasal spray   Commonly known as: AFRIN      TAKE these medications         albuterol 108 (90 BASE) MCG/ACT inhaler   Commonly known as: PROVENTIL HFA;VENTOLIN HFA   Inhale 2 puffs into the lungs every 4 (four) hours as needed. For asthma      azithromycin 250 MG tablet   Commonly known as: ZITHROMAX   Take 1 tablet (250 mg total) by mouth daily.      diphenhydrAMINE 25 MG tablet   Commonly known as: BENADRYL   Take 25 mg by mouth every 6 (six) hours as needed. For allergies/wheezing      fluticasone 50 MCG/ACT nasal spray   Commonly known as: FLONASE   Place 2 sprays into the nose daily.      Fluticasone-Salmeterol 100-50 MCG/DOSE Aepb   Commonly known as: ADVAIR   Inhale 1 puff into the lungs 2 (two) times daily.      omeprazole 40 MG capsule   Commonly known as: PRILOSEC   Take 1 capsule (40 mg total) by mouth daily.      predniSONE 20 MG tablet   Commonly known as: DELTASONE   Take 2 tablets (40 mg total) by mouth daily.           Follow-up Information    Please follow up. [facility] 1st week of January as scheduled.)           The results of significant diagnostics from this hospitalization (including imaging, microbiology, ancillary and laboratory) are listed below for reference.    Significant Diagnostic Studies: Dg Chest 2 View  08/01/2012  *RADIOLOGY REPORT*  Clinical Data: Recent diagnosis of pneumonia.  Cough and congestion.  CHEST - 2 VIEW  Comparison: CT chest 07/28/2012 and PA and lateral chest 07/14/2012.  Findings: Patchy bilateral airspace disease, worse on the right, seen on the prior CT scan has improved.  No pneumothorax or pleural effusion is identified.  Heart size is normal.  IMPRESSION: Improved bilateral airspace disease most consistent with resolving pneumonia.   Original Report Authenticated By: Holley Dexter,  M.D.    Dg Chest 2 View  07/14/2012  *RADIOLOGY REPORT*  Clinical Data: Shortness of breath.  Nonproductive cough.  Fever.  CHEST - 2 VIEW  Comparison:  None.  Findings:  The heart size and mediastinal contours are within normal limits.  Both lungs are clear.  The visualized skeletal structures are unremarkable.  IMPRESSION: No active cardiopulmonary disease.   Original Report Authenticated By: Myles Rosenthal, M.D.    Ct Angio Chest Pe W/cm &/or Wo Cm  07/28/2012  *RADIOLOGY REPORT*  Clinical Data: Chest pain, shortness of breath, elevated D-dimer, wheezing and shortness of breath.  CT ANGIOGRAPHY CHEST  Technique:  Multidetector CT imaging of the chest using the standard protocol during bolus administration of intravenous contrast. Multiplanar reconstructed images including MIPs were obtained and reviewed to evaluate the vascular anatomy.  Contrast:  OMNIPAQUE IOHEXOL 350 MG/ML SOLN  Comparison: Radiographs dated 07/14/2012.  Findings: The patient has a duplicated superior vena cava.  Mildly enlarged mediastinal and bilateral hilar lymph nodes.  These include a 14 mm short axis right hilar lymph node on image number 53, 8 mm short axis left hilar lymph node on image number 58, 12 mm short-axis subcarinal lymph node on image number 50 and 10 mm right anterior paratracheal lymph node on image number 37.  Normally opacified pulmonary arteries with no pulmonary arterial filling defects seen.  Patchy alveolar and interstitial opacities in both upper lobes, both lower lobes and in the right middle lobe. This is multifocal.  No pleural fluid.  Minimal thoracic spine degenerative changes.  Unremarkable upper abdomen.  IMPRESSION:  1. Patchy, multi focal interstitial and alveolar pneumonia or pneumonitis, involving all lobes bilaterally. 2.  Mild mediastinal and bilateral hilar adenopathy, most likely reactive. 3.  No pulmonary emboli. 4.  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   Original  Report Authenticated By: Beckie Salts, M.D.     Microbiology: Recent Results (from the past 240 hour(s))  WET PREP, GENITAL     Status: Abnormal   Collection Time   07/28/12  6:13 PM      Component Value Range Status Comment   Yeast Wet Prep HPF POC NONE SEEN  NONE SEEN Final    Trich, Wet Prep NONE SEEN  NONE SEEN Final    Clue Cells Wet Prep HPF POC MODERATE (*) NONE SEEN Final    WBC, Wet Prep HPF POC MODERATE (*) NONE SEEN Final   GC/CHLAMYDIA PROBE AMP     Status: Normal   Collection Time   07/28/12  6:14 PM      Component Value Range Status Comment   CT Probe RNA NEGATIVE  NEGATIVE Final    GC Probe RNA NEGATIVE  NEGATIVE Final      Labs: Basic Metabolic Panel:  Lab 08/03/12 1610 08/02/12 0958 08/02/12 0059 07/28/12 1657  NA 137 -- 133* 136  K 3.6 -- 3.8 4.0  CL 106 -- 101 103  CO2 21 -- 21 22  GLUCOSE 88 -- 84 74  BUN 11 -- 9 6  CREATININE 0.67 0.65 0.65 0.71  CALCIUM 8.6 -- 9.4 8.7  MG -- -- -- --  PHOS -- -- -- --   Liver Function Tests:  Lab 07/28/12 1657  AST 25  ALT 18  ALKPHOS 57  BILITOT 0.4  PROT 7.0  ALBUMIN 3.1*   No results found for this basename: LIPASE:5,AMYLASE:5 in the last 168 hours No results found for this basename: AMMONIA:5 in the last 168 hours CBC:  Lab 08/02/12 0958 08/02/12 0059 07/28/12 1657  WBC 3.3* 4.6 3.7*  NEUTROABS -- 1.6* 2.1  HGB 13.2 13.3 13.9  HCT 37.7 38.1 40.2  MCV 83.4 83.0 85.2  PLT 247 261 202   Cardiac Enzymes: No results found for this basename: CKTOTAL:5,CKMB:5,CKMBINDEX:5,TROPONINI:5 in the last 168 hours BNP: BNP (last 3 results) No results found for this basename: PROBNP:3 in the last 8760 hours CBG: No results found for this basename: GLUCAP:5 in the last 168 hours     Signed:  Kela Millin  [facility] 08/03/2012, 2:58 PM

## 2012-08-03 NOTE — Progress Notes (Signed)
Discussed prescriptions/meds to patient including her appointment.patient verbalized understanding.in stable condition when discharged.

## 2012-08-03 NOTE — Progress Notes (Signed)
NCM reviewed chart. No CM needs identified. Isidoro Donning RN CCM Case Mgmt phone [phone]

## 2012-08-07 NOTE — ED Provider Notes (Signed)
Medical screening examination/treatment/procedure(s) were performed by non-physician practitioner and as supervising physician I was immediately available for consultation/collaboration.  Marwan T Powers, MD 08/07/12 1508 

## 2012-08-08 ENCOUNTER — Other Ambulatory Visit: Payer: Self-pay | Admitting: Obstetrics and Gynecology

## 2012-08-08 MED ORDER — LEVONORGESTREL-ETHINYL ESTRAD 0.1-20 MG-MCG PO TABS: 1.0000 | ORAL_TABLET | Freq: Every day | ORAL | Status: DC

## 2012-10-24 ENCOUNTER — Ambulatory Visit
Admission: RE | Admit: 2012-10-24 | Discharge: 2012-10-24 | Disposition: A | Discharge status: Home / Self Care | Payer: Medicaid Other | Source: Ambulatory Visit | Attending: Pediatrics | Admitting: Pediatrics

## 2012-10-24 ENCOUNTER — Other Ambulatory Visit: Payer: Self-pay | Admitting: Pediatrics

## 2012-10-24 DIAGNOSIS — J189 Pneumonia, unspecified organism: Secondary | ICD-10-CM

## 2012-11-21 ENCOUNTER — Encounter (HOSPITAL_COMMUNITY): Payer: Self-pay | Admitting: Emergency Medicine

## 2012-11-21 ENCOUNTER — Emergency Department (HOSPITAL_COMMUNITY)
Admission: EM | Admit: 2012-11-21 | Discharge: 2012-11-21 | Disposition: A | Discharge status: Home / Self Care | Payer: Medicaid Other | Attending: Emergency Medicine | Admitting: Emergency Medicine

## 2012-11-21 ENCOUNTER — Emergency Department (HOSPITAL_COMMUNITY): Payer: Medicaid Other

## 2012-11-21 DIAGNOSIS — Z87891 Personal history of nicotine dependence: Secondary | ICD-10-CM | POA: Insufficient documentation

## 2012-11-21 DIAGNOSIS — S60229A Contusion of unspecified hand, initial encounter: Secondary | ICD-10-CM | POA: Insufficient documentation

## 2012-11-21 DIAGNOSIS — Z862 Personal history of diseases of the blood and blood-forming organs and certain disorders involving the immune mechanism: Secondary | ICD-10-CM | POA: Insufficient documentation

## 2012-11-21 DIAGNOSIS — Z8619 Personal history of other infectious and parasitic diseases: Secondary | ICD-10-CM | POA: Insufficient documentation

## 2012-11-21 DIAGNOSIS — T07XXXA Unspecified multiple injuries, initial encounter: Secondary | ICD-10-CM

## 2012-11-21 DIAGNOSIS — Z872 Personal history of diseases of the skin and subcutaneous tissue: Secondary | ICD-10-CM | POA: Insufficient documentation

## 2012-11-21 DIAGNOSIS — J45909 Unspecified asthma, uncomplicated: Secondary | ICD-10-CM | POA: Insufficient documentation

## 2012-11-21 DIAGNOSIS — IMO0002 Reserved for concepts with insufficient information to code with codable children: Secondary | ICD-10-CM | POA: Insufficient documentation

## 2012-11-21 DIAGNOSIS — Z79899 Other long term (current) drug therapy: Secondary | ICD-10-CM | POA: Insufficient documentation

## 2012-11-21 DIAGNOSIS — E669 Obesity, unspecified: Secondary | ICD-10-CM | POA: Insufficient documentation

## 2012-11-21 DIAGNOSIS — Z8639 Personal history of other endocrine, nutritional and metabolic disease: Secondary | ICD-10-CM | POA: Insufficient documentation

## 2012-11-21 DIAGNOSIS — Z23 Encounter for immunization: Secondary | ICD-10-CM | POA: Insufficient documentation

## 2012-11-21 DIAGNOSIS — Z8742 Personal history of other diseases of the female genital tract: Secondary | ICD-10-CM | POA: Insufficient documentation

## 2012-11-21 MED ORDER — HYDROCODONE-ACETAMINOPHEN 5-325 MG PO TABS: 1.0000 | ORAL_TABLET | ORAL | Status: DC | PRN

## 2012-11-21 MED ORDER — TETANUS-DIPHTH-ACELL PERTUSSIS 5-2.5-18.5 LF-MCG/0.5 IM SUSP
0.5000 mL | Freq: Once | INTRAMUSCULAR | Status: AC
  Administered 2012-11-21: 0.5 mL via INTRAMUSCULAR
  Filled 2012-11-21: qty 0.5
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
## 2012-11-21 NOTE — ED Notes (Signed)
Pt discharged.Vital signs stable and GCS 15 
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
## 2012-12-02 ENCOUNTER — Emergency Department (HOSPITAL_COMMUNITY)
Admission: EM | Admit: 2012-12-02 | Discharge: 2012-12-02 | Disposition: A | Discharge status: Home / Self Care | Payer: Medicaid Other | Attending: Emergency Medicine | Admitting: Emergency Medicine

## 2012-12-02 ENCOUNTER — Encounter (HOSPITAL_COMMUNITY): Payer: Self-pay | Admitting: Emergency Medicine

## 2012-12-02 DIAGNOSIS — E669 Obesity, unspecified: Secondary | ICD-10-CM | POA: Insufficient documentation

## 2012-12-02 DIAGNOSIS — Z3202 Encounter for pregnancy test, result negative: Secondary | ICD-10-CM | POA: Insufficient documentation

## 2012-12-02 DIAGNOSIS — R112 Nausea with vomiting, unspecified: Secondary | ICD-10-CM | POA: Insufficient documentation

## 2012-12-02 DIAGNOSIS — R109 Unspecified abdominal pain: Secondary | ICD-10-CM

## 2012-12-02 DIAGNOSIS — R197 Diarrhea, unspecified: Secondary | ICD-10-CM | POA: Insufficient documentation

## 2012-12-02 DIAGNOSIS — R059 Cough, unspecified: Secondary | ICD-10-CM | POA: Insufficient documentation

## 2012-12-02 DIAGNOSIS — R05 Cough: Secondary | ICD-10-CM | POA: Insufficient documentation

## 2012-12-02 DIAGNOSIS — Z8742 Personal history of other diseases of the female genital tract: Secondary | ICD-10-CM | POA: Insufficient documentation

## 2012-12-02 DIAGNOSIS — Z8619 Personal history of other infectious and parasitic diseases: Secondary | ICD-10-CM | POA: Insufficient documentation

## 2012-12-02 DIAGNOSIS — J45909 Unspecified asthma, uncomplicated: Secondary | ICD-10-CM | POA: Insufficient documentation

## 2012-12-02 DIAGNOSIS — Z79899 Other long term (current) drug therapy: Secondary | ICD-10-CM | POA: Insufficient documentation

## 2012-12-02 DIAGNOSIS — Z8709 Personal history of other diseases of the respiratory system: Secondary | ICD-10-CM | POA: Insufficient documentation

## 2012-12-02 DIAGNOSIS — Z87440 Personal history of urinary (tract) infections: Secondary | ICD-10-CM | POA: Insufficient documentation

## 2012-12-02 DIAGNOSIS — Z87891 Personal history of nicotine dependence: Secondary | ICD-10-CM | POA: Insufficient documentation

## 2012-12-02 DIAGNOSIS — Z872 Personal history of diseases of the skin and subcutaneous tissue: Secondary | ICD-10-CM | POA: Insufficient documentation

## 2012-12-02 DIAGNOSIS — IMO0002 Reserved for concepts with insufficient information to code with codable children: Secondary | ICD-10-CM | POA: Insufficient documentation

## 2012-12-02 DIAGNOSIS — R42 Dizziness and giddiness: Secondary | ICD-10-CM | POA: Insufficient documentation

## 2012-12-02 DIAGNOSIS — E039 Hypothyroidism, unspecified: Secondary | ICD-10-CM | POA: Insufficient documentation

## 2012-12-02 LAB — CBC WITH DIFFERENTIAL/PLATELET
Basophils Absolute: 0 10*3/uL (ref 0.0–0.1)
Eosinophils Relative: 4 % (ref 0–5)
HCT: 36.7 % (ref 36.0–46.0)
Lymphocytes Relative: 19 % (ref 12–46)
Lymphs Abs: 1 10*3/uL (ref 0.7–4.0)
MCV: 84.8 fL (ref 78.0–100.0)
Monocytes Absolute: 0.4 10*3/uL (ref 0.1–1.0)
RDW: 13.4 % (ref 11.5–15.5)
WBC: 5.3 10*3/uL (ref 4.0–10.5)

## 2012-12-02 LAB — COMPREHENSIVE METABOLIC PANEL
AST: 19 U/L (ref 0–37)
BUN: 4 mg/dL — ABNORMAL LOW (ref 6–23)
CO2: 22 mEq/L (ref 19–32)
Calcium: 8.8 mg/dL (ref 8.4–10.5)
Chloride: 103 mEq/L (ref 96–112)
Creatinine, Ser: 0.81 mg/dL (ref 0.50–1.10)
GFR calc Af Amer: >90 mL/min (ref >90)
GFR calc non Af Amer: >90 mL/min (ref >90)
Glucose, Bld: 95 mg/dL (ref 70–99)
Total Bilirubin: 0.8 mg/dL (ref 0.3–1.2)

## 2012-12-02 LAB — LIPASE, BLOOD: Lipase: 15 U/L (ref 11–59)

## 2012-12-02 LAB — URINE MICROSCOPIC-ADD ON

## 2012-12-02 LAB — URINALYSIS, ROUTINE W REFLEX MICROSCOPIC
Bilirubin Urine: NEGATIVE
Glucose, UA: NEGATIVE mg/dL
Hgb urine dipstick: NEGATIVE
Ketones, ur: NEGATIVE mg/dL
Protein, ur: NEGATIVE mg/dL
Urobilinogen, UA: 1 mg/dL (ref 0.0–1.0)

## 2012-12-02 LAB — PREGNANCY, URINE: Preg Test, Ur: NEGATIVE

## 2012-12-02 MED ORDER — PROMETHAZINE HCL 25 MG PO TABS: 25.0000 mg | ORAL_TABLET | Freq: Four times a day (QID) | ORAL | Status: DC | PRN

## 2012-12-02 MED ORDER — MORPHINE SULFATE 4 MG/ML IJ SOLN
4.0000 mg | Freq: Once | INTRAMUSCULAR | Status: AC
  Administered 2012-12-02: 4 mg via INTRAVENOUS
  Filled 2012-12-02: qty 1

## 2012-12-02 MED ORDER — ONDANSETRON HCL 4 MG/2ML IJ SOLN
4.0000 mg | Freq: Once | INTRAMUSCULAR | Status: AC
  Administered 2012-12-02: 4 mg via INTRAVENOUS
  Filled 2012-12-02: qty 2

## 2012-12-02 MED ORDER — ONDANSETRON HCL 4 MG/2ML IJ SOLN: 4.0000 mg | Freq: Once | INTRAMUSCULAR | Status: DC

## 2012-12-02 MED ORDER — MORPHINE SULFATE 4 MG/ML IJ SOLN: 4.0000 mg | INTRAMUSCULAR | Status: DC | PRN

## 2012-12-02 NOTE — ED Notes (Signed)
Per EMS-pt c/o of abd pain, nausea, vomiting, diarrhea x2 days. Newly diagnosed asthma, also c/o of cough and congestion. NAD at this time.

## 2012-12-02 NOTE — ED Provider Notes (Signed)
History     CSN: [id number]  Arrival date & time 12/02/12  4098   First MD Initiated Contact with Patient 12/02/12 1006      Chief Complaint  Patient presents with  . Abdominal Pain  . Nausea  . Emesis    (Consider location/radiation/quality/duration/timing/severity/associated sxs/prior treatment) HPI Gwendolyn Clay is a 27 y.o. female who presents with complaint of abdominal pain, nausea, vomiting, diarrhea, dizziness. Pain is constant, throbbing, for 2 days. Pt has been taking vicodin with no relief. PT also reports cough, that is productive. Denies any fever, chills, shortness of breath, urinary symptoms. Denies any vaginal symptoms. Hx of similar symptoms, states had UTI. Pt states she is unable to eat or drink without vomiting.    Past Medical History  Diagnosis Date  . Hypothyroidism   . History of chicken pox   . Dysmenorrhea   . Obese   . Bacterial infection   . Trichomonas   . Gonorrhea   . Asthma   . Allergic rhinitis     adenoidectomy  . Eczema     Past Surgical History  Procedure Laterality Date  . Adenoidectomy    . Wisdom tooth extraction      Family History  Problem Relation Age of Onset  . Arthritis Maternal Aunt   . Diabetes Maternal Uncle   . Hypertension Maternal Grandmother   . Cancer Maternal Grandfather     PROSTATE  . Asthma      neices and nephews  . Allergic rhinitis Brother   . Eczema      neices and nephews  . Eczema Daughter     History  Substance Use Topics  . Smoking status: Former Smoker -- 0.50 packs/day    Quit date: 06/08/2012  . Smokeless tobacco: Never Used  . Alcohol Use: Yes     Comment: occasional    OB History   Grav Para Term Preterm Abortions TAB SAB Ect Mult Living   1 1 1       1       Review of Systems  Constitutional: Negative for fever and chills.  HENT: Negative for neck pain and neck stiffness.   Respiratory: Positive for cough. Negative for chest tightness, shortness of breath and wheezing.    Cardiovascular: Negative.   Gastrointestinal: Positive for nausea, vomiting, abdominal pain and diarrhea.  Genitourinary: Positive for flank pain. Negative for dysuria, vaginal bleeding, vaginal discharge, vaginal pain and pelvic pain.  Musculoskeletal: Negative for back pain.  Skin: Negative.   Neurological: Positive for dizziness and light-headedness. Negative for weakness, numbness and headaches.    Allergies  Review of patient's allergies indicates no known allergies.  Home Medications   Current Outpatient Rx  Name  Route  Sig  Dispense  Refill  . albuterol (PROVENTIL HFA;VENTOLIN HFA) 108 (90 BASE) MCG/ACT inhaler   Inhalation   Inhale 2 puffs into the lungs every 4 (four) hours as needed. For asthma         . beclomethasone (QVAR) 80 MCG/ACT inhaler   Inhalation   Inhale 1 puff into the lungs as needed.         . cetirizine (ZYRTEC) 10 MG tablet   Oral   Take 10 mg by mouth daily.         Marland Kitchen HYDROcodone-acetaminophen (NORCO) 5-325 MG per tablet   Oral   Take 1-2 tablets by mouth every 4 (four) hours as needed for pain.   20 tablet   0   . levonorgestrel-ethinyl  estradiol (AVIANE) 0.1-20 MG-MCG tablet   Oral   Take 1 tablet by mouth daily.   1 Package   11     BP 117/67  Pulse 65  Temp(Src) 98.8 F (37.1 C) (Oral)  Resp 20  SpO2 100%  Physical Exam  Nursing note and vitals reviewed. Constitutional: She is oriented to person, place, and time. She appears well-developed and well-nourished. No distress.  HENT:  Head: Normocephalic.  Eyes: Conjunctivae are normal.  Neck: Neck supple.  Cardiovascular: Normal rate, regular rhythm and normal heart sounds.   Pulmonary/Chest: Effort normal and breath sounds normal. No respiratory distress. She has no wheezes. She has no rales.  Abdominal: Soft. Bowel sounds are normal. She exhibits no distension. There is tenderness. There is no rebound and no guarding.  Tenderness in the left upper quadrant. Left CVA  tenderness  Musculoskeletal: She exhibits no edema.  Neurological: She is alert and oriented to person, place, and time.  Skin: Skin is warm and dry.    ED Course  Procedures (including critical care time)  Results for orders placed during the hospital encounter of 12/02/12  CBC WITH DIFFERENTIAL      Result Value Range   WBC 5.3  4.0 - 10.5 K/uL   RBC 4.33  3.87 - 5.11 MIL/uL   Hemoglobin 12.7  12.0 - 15.0 g/dL   HCT 16.1  09.6 - 04.5 %   MCV 84.8  78.0 - 100.0 fL   MCH 29.3  26.0 - 34.0 pg   MCHC 34.6  30.0 - 36.0 g/dL   RDW 40.9  81.1 - 91.4 %   Platelets 252  150 - 400 K/uL   Neutrophils Relative 70  43 - 77 %   Neutro Abs 3.7  1.7 - 7.7 K/uL   Lymphocytes Relative 19  12 - 46 %   Lymphs Abs 1.0  0.7 - 4.0 K/uL   Monocytes Relative 7  3 - 12 %   Monocytes Absolute 0.4  0.1 - 1.0 K/uL   Eosinophils Relative 4  0 - 5 %   Eosinophils Absolute 0.2  0.0 - 0.7 K/uL   Basophils Relative 0  0 - 1 %   Basophils Absolute 0.0  0.0 - 0.1 K/uL  URINALYSIS, ROUTINE W REFLEX MICROSCOPIC      Result Value Range   Color, Urine YELLOW  YELLOW   APPearance CLOUDY (*) CLEAR   Specific Gravity, Urine 1.023  1.005 - 1.030   pH 8.0  5.0 - 8.0   Glucose, UA NEGATIVE  NEGATIVE mg/dL   Hgb urine dipstick NEGATIVE  NEGATIVE   Bilirubin Urine NEGATIVE  NEGATIVE   Ketones, ur NEGATIVE  NEGATIVE mg/dL   Protein, ur NEGATIVE  NEGATIVE mg/dL   Urobilinogen, UA 1.0  0.0 - 1.0 mg/dL   Nitrite NEGATIVE  NEGATIVE   Leukocytes, UA SMALL (*) NEGATIVE  PREGNANCY, URINE      Result Value Range   Preg Test, Ur NEGATIVE  NEGATIVE  COMPREHENSIVE METABOLIC PANEL      Result Value Range   Sodium 136  135 - 145 mEq/L   Potassium 3.8  3.5 - 5.1 mEq/L   Chloride 103  96 - 112 mEq/L   CO2 22  19 - 32 mEq/L   Glucose, Bld 95  70 - 99 mg/dL   BUN 4 (*) 6 - 23 mg/dL   Creatinine, Ser 7.82  0.50 - 1.10 mg/dL   Calcium 8.8  8.4 - 95.6 mg/dL  Total Protein 6.9  6.0 - 8.3 g/dL   Albumin 3.0 (*) 3.5 - 5.2  g/dL   AST 19  0 - 37 U/L   ALT 9  0 - 35 U/L   Alkaline Phosphatase 53  39 - 117 U/L   Total Bilirubin 0.8  0.3 - 1.2 mg/dL   GFR calc non Af Amer >90  >90 mL/min   GFR calc Af Amer >90  >90 mL/min  LIPASE, BLOOD      Result Value Range   Lipase 15  11 - 59 U/L  URINE MICROSCOPIC-ADD ON      Result Value Range   Squamous Epithelial / LPF MANY (*) RARE   WBC, UA 3-6  <3 WBC/hpf   Bacteria, UA FEW (*) RARE     1. Abdominal pain   2. Nausea vomiting and diarrhea       MDM  Pt with upper abdominal pain,nausea, vomiting diarrhea. Close sick contact with her daughter who had similar simptoms. Her labs are normal. UA does not appear infected. Urine pregnancy negative. VS normal. I do not think pt has a surgical abdomen. She was treated with morphine 4mg  IV for two doses and zofran 4 mg IV. She was hydrated with IV NS. She is feeling better. Tolerating PO fluids. Plan to d/c home with anti emetics. Precautions given, if worsening to return to ER. Pt agrees with the plan.   Filed Vitals:   12/02/12 1002 12/02/12 1207  BP: 117/67 107/67  Pulse: 65 63  Temp: 98.8 F (37.1 C)   TempSrc: Oral   Resp: 20 16  SpO2: 100% 100%           Lottie Mussel, PA-C 12/02/12 1937

## 2012-12-02 NOTE — ED Notes (Signed)
Bed:WA24<BR> Expected date:<BR> Expected time:<BR> Means of arrival:<BR> Comments:<BR> abd pain

## 2012-12-03 LAB — URINE CULTURE: Culture: NO GROWTH

## 2012-12-03 NOTE — ED Provider Notes (Signed)
Medical screening examination/treatment/procedure(s) were performed by non-physician practitioner and as supervising physician I was immediately available for consultation/collaboration.  Kjerstin Abrigo L Perlie Scheuring, MD 12/03/12 1053 

## 2013-06-05 ENCOUNTER — Encounter (HOSPITAL_COMMUNITY): Payer: Self-pay | Admitting: Emergency Medicine

## 2013-06-05 ENCOUNTER — Emergency Department (HOSPITAL_COMMUNITY)
Admission: EM | Admit: 2013-06-05 | Discharge: 2013-06-05 | Disposition: A | Discharge status: Home / Self Care | Payer: Medicaid Other | Attending: Emergency Medicine | Admitting: Emergency Medicine

## 2013-06-05 DIAGNOSIS — A599 Trichomoniasis, unspecified: Secondary | ICD-10-CM | POA: Insufficient documentation

## 2013-06-05 DIAGNOSIS — Z79899 Other long term (current) drug therapy: Secondary | ICD-10-CM | POA: Insufficient documentation

## 2013-06-05 DIAGNOSIS — Z872 Personal history of diseases of the skin and subcutaneous tissue: Secondary | ICD-10-CM | POA: Insufficient documentation

## 2013-06-05 DIAGNOSIS — Z87891 Personal history of nicotine dependence: Secondary | ICD-10-CM | POA: Insufficient documentation

## 2013-06-05 DIAGNOSIS — E669 Obesity, unspecified: Secondary | ICD-10-CM | POA: Insufficient documentation

## 2013-06-05 DIAGNOSIS — N760 Acute vaginitis: Secondary | ICD-10-CM | POA: Insufficient documentation

## 2013-06-05 DIAGNOSIS — A499 Bacterial infection, unspecified: Secondary | ICD-10-CM | POA: Insufficient documentation

## 2013-06-05 DIAGNOSIS — B9689 Other specified bacterial agents as the cause of diseases classified elsewhere: Secondary | ICD-10-CM | POA: Insufficient documentation

## 2013-06-05 DIAGNOSIS — Z8619 Personal history of other infectious and parasitic diseases: Secondary | ICD-10-CM | POA: Insufficient documentation

## 2013-06-05 DIAGNOSIS — IMO0002 Reserved for concepts with insufficient information to code with codable children: Secondary | ICD-10-CM | POA: Insufficient documentation

## 2013-06-05 DIAGNOSIS — J45901 Unspecified asthma with (acute) exacerbation: Secondary | ICD-10-CM | POA: Insufficient documentation

## 2013-06-05 LAB — GC/CHLAMYDIA PROBE AMP
CT Probe RNA: NEGATIVE
GC Probe RNA: NEGATIVE

## 2013-06-05 LAB — URINALYSIS, ROUTINE W REFLEX MICROSCOPIC
Bilirubin Urine: NEGATIVE
Nitrite: NEGATIVE
Specific Gravity, Urine: 1.025 (ref 1.005–1.030)
Urobilinogen, UA: 0.2 mg/dL (ref 0.0–1.0)
pH: 6 (ref 5.0–8.0)

## 2013-06-05 LAB — URINE MICROSCOPIC-ADD ON

## 2013-06-05 LAB — WET PREP, GENITAL

## 2013-06-05 MED ORDER — PREDNISONE 20 MG PO TABS
60.0000 mg | ORAL_TABLET | Freq: Once | ORAL | Status: AC
Start: 2013-06-05 — End: 2013-06-05
  Administered 2013-06-05: 60 mg via ORAL
  Filled 2013-06-05: qty 3

## 2013-06-05 MED ORDER — AZITHROMYCIN 250 MG PO TABS
1000.0000 mg | ORAL_TABLET | Freq: Once | ORAL | Status: AC
  Administered 2013-06-05: 1000 mg via ORAL
  Filled 2013-06-05: qty 4

## 2013-06-05 MED ORDER — ALBUTEROL SULFATE (5 MG/ML) 0.5% IN NEBU: 2.5000 mg | INHALATION_SOLUTION | Freq: Once | RESPIRATORY_TRACT | Status: DC

## 2013-06-05 MED ORDER — ALBUTEROL SULFATE (5 MG/ML) 0.5% IN NEBU
5.0000 mg | INHALATION_SOLUTION | Freq: Once | RESPIRATORY_TRACT | Status: AC
  Administered 2013-06-05: 5 mg via RESPIRATORY_TRACT
  Filled 2013-06-05: qty 1

## 2013-06-05 MED ORDER — ALBUTEROL SULFATE HFA 108 (90 BASE) MCG/ACT IN AERS: 2.0000 | INHALATION_SPRAY | RESPIRATORY_TRACT | Status: DC | PRN

## 2013-06-05 MED ORDER — METRONIDAZOLE 500 MG PO TABS: 500.0000 mg | ORAL_TABLET | Freq: Two times a day (BID) | ORAL | Status: DC

## 2013-06-05 MED ORDER — CEFTRIAXONE SODIUM 250 MG IJ SOLR
250.0000 mg | Freq: Once | INTRAMUSCULAR | Status: AC
  Administered 2013-06-05: 250 mg via INTRAMUSCULAR
  Filled 2013-06-05: qty 250

## 2013-06-05 MED ORDER — PREDNISONE 20 MG PO TABS: 60.0000 mg | ORAL_TABLET | Freq: Every day | ORAL | Status: DC

## 2013-06-05 NOTE — ED Provider Notes (Signed)
CSN: [id number]     Arrival date & time 06/05/13  5621 History   First MD Initiated Contact with Patient 06/05/13 0259     Chief Complaint  Patient presents with  . Asthma   (Consider location/radiation/quality/duration/timing/severity/associated sxs/prior Treatment) HPI 27 yo female presents to the ER from home with complaint of 2 days of increased wheezing, cough, and chest tightness. She has been using her qvar and albuterol without improvement.  No fever, chills.  Pt also c/o pain with urination and vaginal d/c, concern for STD.  She has had new partner without protection.    Past Medical History  Diagnosis Date  . Hypothyroidism   . History of chicken pox   . Dysmenorrhea   . Obese   . Bacterial infection   . Trichomonas   . Gonorrhea   . Asthma   . Allergic rhinitis     adenoidectomy  . Eczema    Past Surgical History  Procedure Laterality Date  . Adenoidectomy    . Wisdom tooth extraction     Family History  Problem Relation Age of Onset  . Arthritis Maternal Aunt   . Diabetes Maternal Uncle   . Hypertension Maternal Grandmother   . Cancer Maternal Grandfather     PROSTATE  . Asthma      neices and nephews  . Allergic rhinitis Brother   . Eczema      neices and nephews  . Eczema Daughter    History  Substance Use Topics  . Smoking status: Former Smoker -- 0.50 packs/day    Quit date: 06/08/2012  . Smokeless tobacco: Never Used  . Alcohol Use: Yes     Comment: occasional   OB History   Grav Para Term Preterm Abortions TAB SAB Ect Mult Living   1 1 1       1      Review of Systems  All other systems reviewed and are negative.    Allergies  Review of patient's allergies indicates no known allergies.  Home Medications   Current Outpatient Rx  Name  Route  Sig  Dispense  Refill  . beclomethasone (QVAR) 80 MCG/ACT inhaler   Inhalation   Inhale 1 puff into the lungs 2 (two) times daily as needed (allergies).          . cetirizine (ZYRTEC) 10  MG tablet   Oral   Take 10 mg by mouth daily.         . fluticasone (FLONASE) 50 MCG/ACT nasal spray   Nasal   Place 2 sprays into the nose daily.         Marland Kitchen levonorgestrel-ethinyl estradiol (AVIANE) 0.1-20 MG-MCG tablet   Oral   Take 1 tablet by mouth daily.   1 Package   11   . albuterol (PROVENTIL HFA;VENTOLIN HFA) 108 (90 BASE) MCG/ACT inhaler   Inhalation   Inhale 2 puffs into the lungs every 4 (four) hours as needed. For asthma   1 Inhaler   0   . metroNIDAZOLE (FLAGYL) 500 MG tablet   Oral   Take 1 tablet (500 mg total) by mouth 2 (two) times daily.   14 tablet   0   . predniSONE (DELTASONE) 20 MG tablet   Oral   Take 3 tablets (60 mg total) by mouth daily.   15 tablet   0    BP 108/77  Pulse 96  Temp(Src) 99 F (37.2 C) (Oral)  Resp 20  SpO2 100% Physical Exam  Nursing note  and vitals reviewed. Constitutional: She is oriented to person, place, and time. She appears well-developed and well-nourished.  HENT:  Head: Normocephalic and atraumatic.  Nose: Nose normal.  Mouth/Throat: Oropharynx is clear and moist.  Eyes: Conjunctivae and EOM are normal. Pupils are equal, round, and reactive to light.  Neck: Normal range of motion. Neck supple. No JVD present. No tracheal deviation present. No thyromegaly present.  Cardiovascular: Normal rate, regular rhythm, normal heart sounds and intact distal pulses.  Exam reveals no gallop and no friction rub.   No murmur heard. Pulmonary/Chest: Effort normal and breath sounds normal. No stridor. No respiratory distress. She has no wheezes. She has no rales. She exhibits no tenderness.  Prolonged expiratory phase without wheezing after neb tx  Abdominal: Soft. Bowel sounds are normal. She exhibits no distension and no mass. There is no tenderness. There is no rebound and no guarding.  Genitourinary:  External genitalia normal Vagina with thick green/white discharge Cervix closed no lesions No cervical motion  tenderness Adnexa palpated, no masses or tenderness noted Bladder palpated no tenderness Uterus palpated no masses or tenderness    Musculoskeletal: Normal range of motion. She exhibits no edema and no tenderness.  Lymphadenopathy:    She has no cervical adenopathy.  Neurological: She is alert and oriented to person, place, and time. She exhibits normal muscle tone. Coordination normal.  Skin: Skin is warm and dry. No rash noted. No erythema. No pallor.  Psychiatric: She has a normal mood and affect. Her behavior is normal. Judgment and thought content normal.    ED Course  Procedures (including critical care time) Labs Review Labs Reviewed  WET PREP, GENITAL - Abnormal; Notable for the following:    Trich, Wet Prep FEW (*)    Clue Cells Wet Prep HPF POC FEW (*)    WBC, Wet Prep HPF POC MANY (*)    All other components within normal limits  URINALYSIS, ROUTINE W REFLEX MICROSCOPIC - Abnormal; Notable for the following:    Leukocytes, UA TRACE (*)    All other components within normal limits  URINE MICROSCOPIC-ADD ON - Abnormal; Notable for the following:    Squamous Epithelial / LPF FEW (*)    All other components within normal limits  GC/CHLAMYDIA PROBE AMP  PREGNANCY, URINE   Imaging Review No results found.  EKG Interpretation   None       MDM   1. Asthma exacerbation   2. Trichomonas   3. Bacterial vaginosis    27 yo female with asthma exacerbation.  Will d/c with prednisone x 5 days.  No signs of UTI, but trich and possible bv.  Pt has been treated empirically for gc/chlamydia.    Olivia Mackie, MD 06/05/13 [phone]

## 2013-06-05 NOTE — ED Notes (Signed)
Pt reports that she has had increasing SOB r/t her asthma since yesterday. Pt reports wheezing. Pt also has c/o of lower abdominal pain x2 days. Pt reports is painful when she urinates and also is having abnormal discharge. Ax4, NAD.

## 2013-07-18 ENCOUNTER — Emergency Department (HOSPITAL_COMMUNITY)
Admission: EM | Admit: 2013-07-18 | Discharge: 2013-07-18 | Disposition: A | Discharge status: Home / Self Care | Payer: Medicaid Other | Attending: Emergency Medicine | Admitting: Emergency Medicine

## 2013-07-18 ENCOUNTER — Encounter (HOSPITAL_COMMUNITY): Payer: Self-pay | Admitting: Emergency Medicine

## 2013-07-18 DIAGNOSIS — Z8742 Personal history of other diseases of the female genital tract: Secondary | ICD-10-CM | POA: Insufficient documentation

## 2013-07-18 DIAGNOSIS — E669 Obesity, unspecified: Secondary | ICD-10-CM | POA: Insufficient documentation

## 2013-07-18 DIAGNOSIS — Z8619 Personal history of other infectious and parasitic diseases: Secondary | ICD-10-CM | POA: Insufficient documentation

## 2013-07-18 DIAGNOSIS — Z87891 Personal history of nicotine dependence: Secondary | ICD-10-CM | POA: Insufficient documentation

## 2013-07-18 DIAGNOSIS — IMO0002 Reserved for concepts with insufficient information to code with codable children: Secondary | ICD-10-CM | POA: Insufficient documentation

## 2013-07-18 DIAGNOSIS — Z79899 Other long term (current) drug therapy: Secondary | ICD-10-CM | POA: Insufficient documentation

## 2013-07-18 DIAGNOSIS — Z872 Personal history of diseases of the skin and subcutaneous tissue: Secondary | ICD-10-CM | POA: Insufficient documentation

## 2013-07-18 DIAGNOSIS — A084 Viral intestinal infection, unspecified: Secondary | ICD-10-CM

## 2013-07-18 DIAGNOSIS — J45909 Unspecified asthma, uncomplicated: Secondary | ICD-10-CM | POA: Insufficient documentation

## 2013-07-18 DIAGNOSIS — A088 Other specified intestinal infections: Secondary | ICD-10-CM | POA: Insufficient documentation

## 2013-07-18 LAB — CBC WITH DIFFERENTIAL/PLATELET
Basophils Relative: 0 % (ref 0–1)
Eosinophils Relative: 1 % (ref 0–5)
HCT: 39.5 % (ref 36.0–46.0)
Hemoglobin: 13.6 g/dL (ref 12.0–15.0)
Lymphs Abs: 0.5 10*3/uL — ABNORMAL LOW (ref 0.7–4.0)
MCH: 29.9 pg (ref 26.0–34.0)
MCHC: 34.4 g/dL (ref 30.0–36.0)
MCV: 86.8 fL (ref 78.0–100.0)
Monocytes Absolute: 0.2 10*3/uL (ref 0.1–1.0)
Monocytes Relative: 4 % (ref 3–12)
Neutro Abs: 4.2 10*3/uL (ref 1.7–7.7)
Neutrophils Relative %: 85 % — ABNORMAL HIGH (ref 43–77)
RBC: 4.55 MIL/uL (ref 3.87–5.11)
RDW: 14 % (ref 11.5–15.5)

## 2013-07-18 LAB — COMPREHENSIVE METABOLIC PANEL
Albumin: 2.7 g/dL — ABNORMAL LOW (ref 3.5–5.2)
Alkaline Phosphatase: 43 U/L (ref 39–117)
BUN: 4 mg/dL — ABNORMAL LOW (ref 6–23)
Chloride: 106 mEq/L (ref 96–112)
Creatinine, Ser: 0.8 mg/dL (ref 0.50–1.10)
GFR calc Af Amer: >90 mL/min (ref >90)
GFR calc non Af Amer: >90 mL/min (ref >90)
Glucose, Bld: 89 mg/dL (ref 70–99)
Potassium: 4.2 mEq/L (ref 3.5–5.1)
Sodium: 136 mEq/L (ref 135–145)
Total Bilirubin: 0.9 mg/dL (ref 0.3–1.2)

## 2013-07-18 LAB — URINALYSIS, ROUTINE W REFLEX MICROSCOPIC
Bilirubin Urine: NEGATIVE
Ketones, ur: NEGATIVE mg/dL
Nitrite: NEGATIVE
Protein, ur: NEGATIVE mg/dL
Specific Gravity, Urine: 1.017 (ref 1.005–1.030)
Urobilinogen, UA: 1 mg/dL (ref 0.0–1.0)
pH: 7.5 (ref 5.0–8.0)

## 2013-07-18 LAB — URINE MICROSCOPIC-ADD ON

## 2013-07-18 LAB — LIPASE, BLOOD: Lipase: 22 U/L (ref 11–59)

## 2013-07-18 MED ORDER — ONDANSETRON HCL 4 MG PO TABS: 4.0000 mg | ORAL_TABLET | Freq: Three times a day (TID) | ORAL | Status: DC | PRN

## 2013-07-18 MED ORDER — MORPHINE SULFATE 4 MG/ML IJ SOLN
4.0000 mg | Freq: Once | INTRAMUSCULAR | Status: AC
  Administered 2013-07-18: 4 mg via INTRAVENOUS
  Filled 2013-07-18: qty 1

## 2013-07-18 MED ORDER — ONDANSETRON 4 MG PO TBDP
8.0000 mg | ORAL_TABLET | Freq: Once | ORAL | Status: AC
  Administered 2013-07-18: 8 mg via ORAL
  Filled 2013-07-18: qty 2

## 2013-07-18 MED ORDER — SODIUM CHLORIDE 0.9 % IV BOLUS (SEPSIS)
1000.0000 mL | Freq: Once | INTRAVENOUS | Status: AC
  Administered 2013-07-18: 1000 mL via INTRAVENOUS

## 2013-07-18 NOTE — ED Notes (Signed)
Pt states she has been able to tolerate PO liquids.

## 2013-07-18 NOTE — ED Notes (Signed)
Pt to department via EMs- pt reports nausea vomiting and abd pain for the past 2 days. Also reports noticing some blood in her vomit. Denies any urinary symptoms. Also reports a headache.

## 2013-07-18 NOTE — ED Provider Notes (Signed)
CSN: [id number]     Arrival date & time 07/18/13  1705 History   First MD Initiated Contact with Patient 07/18/13 1738     Chief Complaint  Patient presents with  . Abdominal Pain  . Nausea  . Emesis   (Consider location/radiation/quality/duration/timing/severity/associated sxs/prior Treatment) HPI Comments: Patient presents emergency department with chief complaints of nausea, vomiting, diarrhea, and abdominal pain x2 days. She states that her daughter was recently sick with the same. She denies fevers or chills. Denies any dysuria, or vaginal discharge. She states that she has seen some streaky blood in her vomit, as well as some bright red blood in the toilet. She states that her stomach feels very crampy, and that she feels very tired. Nothing makes her symptoms better or worse. She denies any prior abdominal surgery.  The history is provided by the patient. No language interpreter was used.    Past Medical History  Diagnosis Date  . Hypothyroidism   . History of chicken pox   . Dysmenorrhea   . Obese   . Bacterial infection   . Trichomonas   . Gonorrhea   . Asthma   . Allergic rhinitis     adenoidectomy  . Eczema    Past Surgical History  Procedure Laterality Date  . Adenoidectomy    . Wisdom tooth extraction     Family History  Problem Relation Age of Onset  . Arthritis Maternal Aunt   . Diabetes Maternal Uncle   . Hypertension Maternal Grandmother   . Cancer Maternal Grandfather     PROSTATE  . Asthma      neices and nephews  . Allergic rhinitis Brother   . Eczema      neices and nephews  . Eczema Daughter    History  Substance Use Topics  . Smoking status: Former Smoker -- 0.50 packs/day    Quit date: 06/08/2012  . Smokeless tobacco: Never Used  . Alcohol Use: Yes     Comment: occasional   OB History   Grav Para Term Preterm Abortions TAB SAB Ect Mult Living   1 1 1       1      Review of Systems  All other systems reviewed and are  negative.    Allergies  Review of patient's allergies indicates no known allergies.  Home Medications   Current Outpatient Rx  Name  Route  Sig  Dispense  Refill  . albuterol (PROVENTIL HFA;VENTOLIN HFA) 108 (90 BASE) MCG/ACT inhaler   Inhalation   Inhale 2 puffs into the lungs every 4 (four) hours as needed. For asthma   1 Inhaler   0   . beclomethasone (QVAR) 80 MCG/ACT inhaler   Inhalation   Inhale 1 puff into the lungs 2 (two) times daily as needed (allergies).          . cetirizine (ZYRTEC) 10 MG tablet   Oral   Take 10 mg by mouth daily.         . diphenhydramine-acetaminophen (TYLENOL PM EXTRA STRENGTH) 25-500 MG TABS   Oral   Take 1 tablet by mouth at bedtime as needed (sleep).         . fluticasone (FLONASE) 50 MCG/ACT nasal spray   Nasal   Place 2 sprays into the nose daily.          BP 111/73  Pulse 76  Temp(Src) 97.4 F (36.3 C) (Oral)  Resp 20  Ht 5\' 10"  (1.778 m)  Wt 235 lb (106.595 kg)  BMI 33.72 kg/m2  SpO2 100% Physical Exam  Nursing note and vitals reviewed. Constitutional: She is oriented to person, place, and time. She appears well-developed and well-nourished.  HENT:  Head: Normocephalic and atraumatic.  Eyes: Conjunctivae and EOM are normal. Pupils are equal, round, and reactive to light.  Neck: Normal range of motion. Neck supple.  Cardiovascular: Normal rate and regular rhythm.  Exam reveals no gallop and no friction rub.   No murmur heard. Pulmonary/Chest: Effort normal and breath sounds normal. No respiratory distress. She has no wheezes. She has no rales. She exhibits no tenderness.  Abdominal: Soft. Bowel sounds are normal. She exhibits no distension and no mass. There is no tenderness. There is no rebound and no guarding.  Diffuse, crampy abdominal pain, but no focal tenderness, no isolated pain at McBurney's point, no peritoneal signs or signs of acute or surgical abdomen  Musculoskeletal: Normal range of motion. She  exhibits no edema and no tenderness.  Neurological: She is alert and oriented to person, place, and time.  Skin: Skin is warm and dry.  Psychiatric: She has a normal mood and affect. Her behavior is normal. Judgment and thought content normal.    ED Course  Procedures (including critical care time) Results for orders placed during the hospital encounter of 07/18/13  CBC WITH DIFFERENTIAL      Result Value Range   WBC 4.9  4.0 - 10.5 K/uL   RBC 4.55  3.87 - 5.11 MIL/uL   Hemoglobin 13.6  12.0 - 15.0 g/dL   HCT 16.1  09.6 - 04.5 %   MCV 86.8  78.0 - 100.0 fL   MCH 29.9  26.0 - 34.0 pg   MCHC 34.4  30.0 - 36.0 g/dL   RDW 40.9  81.1 - 91.4 %   Platelets 231  150 - 400 K/uL   Neutrophils Relative % 85 (*) 43 - 77 %   Neutro Abs 4.2  1.7 - 7.7 K/uL   Lymphocytes Relative 10 (*) 12 - 46 %   Lymphs Abs 0.5 (*) 0.7 - 4.0 K/uL   Monocytes Relative 4  3 - 12 %   Monocytes Absolute 0.2  0.1 - 1.0 K/uL   Eosinophils Relative 1  0 - 5 %   Eosinophils Absolute 0.0  0.0 - 0.7 K/uL   Basophils Relative 0  0 - 1 %   Basophils Absolute 0.0  0.0 - 0.1 K/uL  COMPREHENSIVE METABOLIC PANEL      Result Value Range   Sodium 136  135 - 145 mEq/L   Potassium 4.2  3.5 - 5.1 mEq/L   Chloride 106  96 - 112 mEq/L   CO2 24  19 - 32 mEq/L   Glucose, Bld 89  70 - 99 mg/dL   BUN 4 (*) 6 - 23 mg/dL   Creatinine, Ser 7.82  0.50 - 1.10 mg/dL   Calcium 8.4  8.4 - 95.6 mg/dL   Total Protein 5.6 (*) 6.0 - 8.3 g/dL   Albumin 2.7 (*) 3.5 - 5.2 g/dL   AST 17  0 - 37 U/L   ALT 10  0 - 35 U/L   Alkaline Phosphatase 43  39 - 117 U/L   Total Bilirubin 0.9  0.3 - 1.2 mg/dL   GFR calc non Af Amer >90  >90 mL/min   GFR calc Af Amer >90  >90 mL/min  LIPASE, BLOOD      Result Value Range   Lipase 22  11 - 59  U/L  URINALYSIS, ROUTINE W REFLEX MICROSCOPIC      Result Value Range   Color, Urine YELLOW  YELLOW   APPearance CLEAR  CLEAR   Specific Gravity, Urine 1.017  1.005 - 1.030   pH 7.5  5.0 - 8.0   Glucose, UA  NEGATIVE  NEGATIVE mg/dL   Hgb urine dipstick TRACE (*) NEGATIVE   Bilirubin Urine NEGATIVE  NEGATIVE   Ketones, ur NEGATIVE  NEGATIVE mg/dL   Protein, ur NEGATIVE  NEGATIVE mg/dL   Urobilinogen, UA 1.0  0.0 - 1.0 mg/dL   Nitrite NEGATIVE  NEGATIVE   Leukocytes, UA TRACE (*) NEGATIVE  URINE MICROSCOPIC-ADD ON      Result Value Range   Squamous Epithelial / LPF RARE  RARE   WBC, UA 0-2  <3 WBC/hpf   RBC / HPF 0-2  <3 RBC/hpf     EKG Interpretation   None       MDM   1. Viral gastroenteritis     Patient with nausea, vomiting, diarrhea. Also complains of abdominal pain x2 days. Daughter was sick with the same. Patient feels much better in the emergency department after fluids, Zofran and a little pain medicine. Labs are reassuring. Patient is afebrile. Abdomen is benign and nonfocal. Patient is tolerating oral intake. Patient does report some bright red blood per rectum, however her blood counts are normal. Advised patient to return if this worsens, otherwise she will followup with PCP or GI.  7:21 PM Patient reevaluated, abdomen remains benign. Still tolerating oral intake. Patient is well-appearing, and states that she is ready to go home. Will discharge with Zofran. Return precautions are given. Patient will return for localizing abdominal pain, or fever. Otherwise she may followup with her primary care provider. She is stable and ready for discharge.   Roxy Horseman, PA-C 07/18/13 1924

## 2013-07-18 NOTE — ED Notes (Signed)
Pt given ginger ale and saltine crackers.  

## 2013-07-18 NOTE — ED Notes (Signed)
Pt c/o abd cramping along with n/v/d that started yesterday. Pt reports bld in stool, bright red, and coffee grounds emesis. Reports she has been waking in with night sweats. Nad, skin warm and dry, resp e/u.

## 2013-07-19 NOTE — ED Provider Notes (Signed)
  Medical screening examination/treatment/procedure(s) were performed by non-physician practitioner and as supervising physician I was immediately available for consultation/collaboration.  EKG Interpretation   None          Gerhard Munch, MD 07/19/13 [phone]

## 2013-09-25 ENCOUNTER — Emergency Department (HOSPITAL_COMMUNITY)
Admission: EM | Admit: 2013-09-25 | Discharge: 2013-09-26 | Discharge status: Against Medical Advice | Payer: Medicaid Other | Attending: Emergency Medicine | Admitting: Emergency Medicine

## 2013-09-25 ENCOUNTER — Encounter (HOSPITAL_COMMUNITY): Payer: Self-pay | Admitting: Emergency Medicine

## 2013-09-25 ENCOUNTER — Emergency Department (HOSPITAL_COMMUNITY): Payer: Medicaid Other

## 2013-09-25 DIAGNOSIS — Z8742 Personal history of other diseases of the female genital tract: Secondary | ICD-10-CM | POA: Insufficient documentation

## 2013-09-25 DIAGNOSIS — Z8619 Personal history of other infectious and parasitic diseases: Secondary | ICD-10-CM | POA: Insufficient documentation

## 2013-09-25 DIAGNOSIS — J069 Acute upper respiratory infection, unspecified: Secondary | ICD-10-CM | POA: Insufficient documentation

## 2013-09-25 DIAGNOSIS — F17200 Nicotine dependence, unspecified, uncomplicated: Secondary | ICD-10-CM | POA: Insufficient documentation

## 2013-09-25 DIAGNOSIS — Z3202 Encounter for pregnancy test, result negative: Secondary | ICD-10-CM | POA: Insufficient documentation

## 2013-09-25 DIAGNOSIS — Z872 Personal history of diseases of the skin and subcutaneous tissue: Secondary | ICD-10-CM | POA: Insufficient documentation

## 2013-09-25 DIAGNOSIS — IMO0002 Reserved for concepts with insufficient information to code with codable children: Secondary | ICD-10-CM | POA: Insufficient documentation

## 2013-09-25 DIAGNOSIS — R109 Unspecified abdominal pain: Secondary | ICD-10-CM | POA: Insufficient documentation

## 2013-09-25 DIAGNOSIS — E669 Obesity, unspecified: Secondary | ICD-10-CM | POA: Insufficient documentation

## 2013-09-25 DIAGNOSIS — B9789 Other viral agents as the cause of diseases classified elsewhere: Secondary | ICD-10-CM

## 2013-09-25 DIAGNOSIS — R112 Nausea with vomiting, unspecified: Secondary | ICD-10-CM | POA: Insufficient documentation

## 2013-09-25 DIAGNOSIS — J45901 Unspecified asthma with (acute) exacerbation: Secondary | ICD-10-CM | POA: Insufficient documentation

## 2013-09-25 DIAGNOSIS — Z79899 Other long term (current) drug therapy: Secondary | ICD-10-CM | POA: Insufficient documentation

## 2013-09-25 LAB — COMPREHENSIVE METABOLIC PANEL
ALK PHOS: 40 U/L (ref 39–117)
ALT: 37 U/L — AB (ref 0–35)
AST: 110 U/L — ABNORMAL HIGH (ref 0–37)
Albumin: 2.7 g/dL — ABNORMAL LOW (ref 3.5–5.2)
BUN: <3 mg/dL — ABNORMAL LOW (ref 6–23)
CO2: 22 mEq/L (ref 19–32)
Calcium: 8.4 mg/dL (ref 8.4–10.5)
Chloride: 101 mEq/L (ref 96–112)
Creatinine, Ser: 0.88 mg/dL (ref 0.50–1.10)
GFR calc Af Amer: >90 mL/min (ref >90)
GFR calc non Af Amer: 89 mL/min — ABNORMAL LOW (ref >90)
Glucose, Bld: 97 mg/dL (ref 70–99)
POTASSIUM: 3.7 meq/L (ref 3.7–5.3)
SODIUM: 136 meq/L — AB (ref 137–147)
Total Bilirubin: 0.3 mg/dL (ref 0.3–1.2)
Total Protein: 5.8 g/dL — ABNORMAL LOW (ref 6.0–8.3)

## 2013-09-25 LAB — CBC WITH DIFFERENTIAL/PLATELET
Basophils Absolute: 0 10*3/uL (ref 0.0–0.1)
Basophils Relative: 0 % (ref 0–1)
EOS ABS: 0 10*3/uL (ref 0.0–0.7)
Eosinophils Relative: 0 % (ref 0–5)
HCT: 37.7 % (ref 36.0–46.0)
Hemoglobin: 13.3 g/dL (ref 12.0–15.0)
LYMPHS ABS: 1.1 10*3/uL (ref 0.7–4.0)
LYMPHS PCT: 32 % (ref 12–46)
MCH: 30.4 pg (ref 26.0–34.0)
MCHC: 35.3 g/dL (ref 30.0–36.0)
MCV: 86.3 fL (ref 78.0–100.0)
Monocytes Absolute: 0.4 10*3/uL (ref 0.1–1.0)
Monocytes Relative: 12 % (ref 3–12)
NEUTROS PCT: 57 % (ref 43–77)
Neutro Abs: 1.9 10*3/uL (ref 1.7–7.7)
PLATELETS: 194 10*3/uL (ref 150–400)
RBC: 4.37 MIL/uL (ref 3.87–5.11)
RDW: 13.8 % (ref 11.5–15.5)
WBC: 3.4 10*3/uL — ABNORMAL LOW (ref 4.0–10.5)

## 2013-09-25 LAB — URINALYSIS, ROUTINE W REFLEX MICROSCOPIC
Bilirubin Urine: NEGATIVE
GLUCOSE, UA: NEGATIVE mg/dL
HGB URINE DIPSTICK: NEGATIVE
Ketones, ur: NEGATIVE mg/dL
Leukocytes, UA: NEGATIVE
Nitrite: NEGATIVE
PH: 7 (ref 5.0–8.0)
Protein, ur: NEGATIVE mg/dL
SPECIFIC GRAVITY, URINE: 1.015 (ref 1.005–1.030)
Urobilinogen, UA: 1 mg/dL (ref 0.0–1.0)

## 2013-09-25 LAB — POCT PREGNANCY, URINE: Preg Test, Ur: NEGATIVE

## 2013-09-25 LAB — LIPASE, BLOOD: Lipase: 18 U/L (ref 11–59)

## 2013-09-25 LAB — WET PREP, GENITAL
TRICH WET PREP: NONE SEEN
YEAST WET PREP: NONE SEEN

## 2013-09-25 MED ORDER — IBUPROFEN 800 MG PO TABS
800.0000 mg | ORAL_TABLET | Freq: Once | ORAL | Status: AC
  Administered 2013-09-25: 800 mg via ORAL
  Filled 2013-09-25: qty 1

## 2013-09-25 MED ORDER — ONDANSETRON 4 MG PO TBDP
8.0000 mg | ORAL_TABLET | Freq: Once | ORAL | Status: AC
  Administered 2013-09-25: 8 mg via ORAL
  Filled 2013-09-25: qty 2

## 2013-09-25 MED ORDER — ALBUTEROL SULFATE (2.5 MG/3ML) 0.083% IN NEBU
5.0000 mg | INHALATION_SOLUTION | Freq: Once | RESPIRATORY_TRACT | Status: AC
  Administered 2013-09-25: 5 mg via RESPIRATORY_TRACT
  Filled 2013-09-25: qty 6

## 2013-09-25 MED ORDER — IPRATROPIUM BROMIDE 0.02 % IN SOLN
0.5000 mg | Freq: Once | RESPIRATORY_TRACT | Status: AC
  Administered 2013-09-25: 0.5 mg via RESPIRATORY_TRACT
  Filled 2013-09-25: qty 2.5

## 2013-09-25 MED ORDER — PREDNISONE 20 MG PO TABS
60.0000 mg | ORAL_TABLET | Freq: Once | ORAL | Status: AC
  Administered 2013-09-25: 60 mg via ORAL
  Filled 2013-09-25: qty 3

## 2013-09-25 NOTE — ED Provider Notes (Signed)
CSN: [id number]     Arrival date & time 09/25/13  1924 History   First MD Initiated Contact with Patient 09/25/13 2206     Chief Complaint  Patient presents with  . Cough  . Asthma    (Consider location/radiation/quality/duration/timing/severity/associated sxs/prior Treatment) HPI Comments: Patient is a 28 year old female with a history of asthma who presents today for cough. Patient states the cough is productive of brown sputum and has been gradually worsening over the last 2 days. Symptoms preceded by nasal congestion and sore throat and have become associated with shortness of breath, fatigue, nausea, and 4 episodes of nonbloody, nonbilious posttussive emesis today. Patient denies any emesis not provoked by coughing. Patient has been trying an albuterol inhaler for her symptoms without relief. She denies any modifying factors of her symptoms. She endorses sick contacts as her daughter was sick with similar symptoms throughout the week.   Patient also with secondary complaint of left suprapubic abdominal pain which is sharp and stabbing. Patient states that symptoms have been gradually worsening x2 days. She has not taken anything for symptoms and states that pain is worse with palpation and coughing. She denies associated fever, hemoptysis, syncope, dysuria or hematuria, vaginal bleeding or discharge, diarrhea, melena or hematochezia, numbness/tingling, and weakness. She denies a history of abdominal surgeries. Last bowel movement was normal for her. She is sexually active with one partner with whom she uses no barrier protection during intercourse.  Patient is a 28 y.o. female presenting with cough and asthma. The history is provided by the patient. No language interpreter was used.  Cough Associated symptoms: shortness of breath and sore throat   Associated symptoms: no fever   Asthma Associated symptoms include abdominal pain, congestion, coughing, fatigue, nausea, a sore throat and  vomiting (posttusive). Pertinent negatives include no fever, numbness or weakness.    Past Medical History  Diagnosis Date  . Hypothyroidism   . History of chicken pox   . Dysmenorrhea   . Obese   . Bacterial infection   . Trichomonas   . Gonorrhea   . Asthma   . Allergic rhinitis     adenoidectomy  . Eczema    Past Surgical History  Procedure Laterality Date  . Adenoidectomy    . Wisdom tooth extraction     Family History  Problem Relation Age of Onset  . Arthritis Maternal Aunt   . Diabetes Maternal Uncle   . Hypertension Maternal Grandmother   . Cancer Maternal Grandfather     PROSTATE  . Asthma      neices and nephews  . Allergic rhinitis Brother   . Eczema      neices and nephews  . Eczema Daughter    History  Substance Use Topics  . Smoking status: Current Every Day Smoker -- 0.20 packs/day  . Smokeless tobacco: Never Used  . Alcohol Use: Yes     Comment: occasional   OB History   Grav Para Term Preterm Abortions TAB SAB Ect Mult Living   1 1 1       1       Review of Systems  Constitutional: Positive for fatigue. Negative for fever.  HENT: Positive for congestion and sore throat. Negative for drooling and trouble swallowing.   Respiratory: Positive for cough, chest tightness and shortness of breath.   Gastrointestinal: Positive for nausea, vomiting (posttusive) and abdominal pain. Negative for diarrhea.  Genitourinary: Negative for dysuria, hematuria, vaginal bleeding and vaginal discharge.  Neurological: Negative for syncope, weakness  and numbness.  All other systems reviewed and are negative.    Allergies  Mold extract and Other  Home Medications   Current Outpatient Rx  Name  Route  Sig  Dispense  Refill  . albuterol (PROVENTIL HFA;VENTOLIN HFA) 108 (90 BASE) MCG/ACT inhaler   Inhalation   Inhale 2 puffs into the lungs every 4 (four) hours as needed. For asthma   1 Inhaler   0   . beclomethasone (QVAR) 80 MCG/ACT inhaler    Inhalation   Inhale 1 puff into the lungs 2 (two) times daily as needed (allergies).          . cetirizine (ZYRTEC) 10 MG tablet   Oral   Take 10 mg by mouth daily.         . diphenhydramine-acetaminophen (TYLENOL PM EXTRA STRENGTH) 25-500 MG TABS   Oral   Take 1 tablet by mouth at bedtime as needed (sleep).         . fluticasone (FLONASE) 50 MCG/ACT nasal spray   Nasal   Place 2 sprays into the nose daily.         Marland Kitchen levonorgestrel-ethinyl estradiol (AVIANE,ALESSE,LESSINA) 0.1-20 MG-MCG tablet   Oral   Take 1 tablet by mouth daily.         Marland Kitchen oxymetazoline (AFRIN) 0.05 % nasal spray   Each Nare   Place 3 sprays into both nostrils daily as needed for congestion.          BP 101/58  Pulse 107  Temp(Src) 99.7 F (37.6 C) (Oral)  Resp 16  SpO2 100%  LMP 09/08/2013  Physical Exam  Nursing note and vitals reviewed. Constitutional: She is oriented to person, place, and time. She appears well-developed and well-nourished. No distress.  HENT:  Head: Normocephalic and atraumatic.  Mouth/Throat: Oropharynx is clear and moist. No oropharyngeal exudate.  Eyes: Conjunctivae and EOM are normal. Pupils are equal, round, and reactive to light. No scleral icterus.  Neck: Normal range of motion.  Cardiovascular: Normal rate, regular rhythm and normal heart sounds.   Patient not tachycardic as noted in triage  Pulmonary/Chest: Effort normal and breath sounds normal. No respiratory distress. She has no wheezes. She has no rales.  No retractions or accessory muscle use. Chest expansion symmetric.  Abdominal: Soft. She exhibits no distension and no mass. There is tenderness (mild in the suprapubic region) in the suprapubic area. There is no rebound, no guarding, no tenderness at McBurney's point and negative Murphy's sign.    Abdomen soft without peritoneal signs.  Genitourinary: Vagina normal. There is no rash, tenderness, lesion or injury on the right labia. There is no rash,  tenderness, lesion or injury on the left labia. Uterus is not tender. Cervix exhibits no motion tenderness, no discharge and no friability. Right adnexum displays no mass, no tenderness and no fullness. Left adnexum displays tenderness. Left adnexum displays no mass and no fullness.  Musculoskeletal: Normal range of motion.  Neurological: She is alert and oriented to person, place, and time.  Skin: Skin is warm and dry. No rash noted. She is not diaphoretic. No erythema. No pallor.  Psychiatric: She has a normal mood and affect. Her behavior is normal.    ED Course  Procedures (including critical care time) Labs Review Labs Reviewed  WET PREP, GENITAL - Abnormal; Notable for the following:    Clue Cells Wet Prep HPF POC FEW (*)    WBC, Wet Prep HPF POC FEW (*)    All other components within  normal limits  CBC WITH DIFFERENTIAL - Abnormal; Notable for the following:    WBC 3.4 (*)    All other components within normal limits  COMPREHENSIVE METABOLIC PANEL - Abnormal; Notable for the following:    Sodium 136 (*)    BUN <3 (*)    Total Protein 5.8 (*)    Albumin 2.7 (*)    AST 110 (*)    ALT 37 (*)    GFR calc non Af Amer 89 (*)    All other components within normal limits  GC/CHLAMYDIA PROBE AMP  LIPASE, BLOOD  URINALYSIS, ROUTINE W REFLEX MICROSCOPIC  POCT PREGNANCY, URINE   Imaging Review Dg Chest 2 View  09/25/2013   CLINICAL DATA:  Cough and shortness of breath for 2 days.  Asthma.  EXAM: CHEST  2 VIEW  COMPARISON:  10/24/2012  FINDINGS: The cardiomediastinal silhouette is within normal limits. The lungs are well inflated and clear. There is no evidence of pleural effusion or pneumothorax. No acute osseous abnormality is identified.  IMPRESSION: Unremarkable appearance of the chest.   Electronically Signed   By: Sebastian AcheAllen  Grady   On: 09/25/2013 21:02    EKG Interpretation   None       MDM   Final diagnoses:  Viral URI with cough  Abdominal pain   28 year old female  with a history of asthma presents for viral upper respiratory symptoms with cough x2 days. She is well and nontoxic appearing, hemodynamically stable, and afebrile. Lungs clear to auscultation bilaterally without wheezes on my initial presentation with the patient. This is after patient has already received one nebulizer treatment for her shortness of breath. She has no tachypnea, dyspnea, or hypoxia. No retractions or accessory muscle use noted. Chest expansion is symmetric. X-ray today is negative for focal consolidation or pneumonia. Suspect symptoms secondary to viral illness as her daughter has been sick with similar symptoms. Patient states symptoms of chest tightness and SOB greatly improved with neb txs in ED.  Patient also secondary complaint of abdominal pain. Patient has no leukocytosis, anemia, or electrolyte imbalance today. Liver function tests mildly elevated, but overall preserved. Kidney function preserved. Urinalysis does not suggest infection. Patient does have tenderness to palpation in her left suprapubic region/TTP of her L adnexa. The my suspicion for ovarian torsion is low, while evaluate further with pelvic ultrasound.  0100 - Patient eloped from ED. States she is "hungry and tired of waiting". Will sign out AMA.  Antony MaduraKelly Adelaide Pfefferkorn, PA-C 09/26/13 0111

## 2013-09-25 NOTE — ED Notes (Signed)
Pt reports feeling improved since HHN while in triage but now feels jittery.  Explained the medication does have that side effect.  Pt with dry cough.

## 2013-09-25 NOTE — ED Notes (Addendum)
C/o LLQ pain, nausea, vomiting (x4),  Sob, wheezing, and productive cough with brown sputum since yesterday.

## 2013-09-26 NOTE — ED Provider Notes (Signed)
Medical screening examination/treatment/procedure(s) were performed by non-physician practitioner and as supervising physician I was immediately available for consultation/collaboration.  EKG Interpretation   None        Sonam Huelsmann, MD 09/26/13 0529 

## 2013-09-26 NOTE — ED Notes (Signed)
Pt stated she no longer wanted to stay; her ride is here and she just wants to go home and eat since she feels better.. Pt was walking out of the room completely dressed, ready to go outdoors.  Pt signed out AMA; PA informed

## 2013-09-28 LAB — GC/CHLAMYDIA PROBE AMP
CT PROBE, AMP APTIMA: NEGATIVE
GC Probe RNA: NEGATIVE

## 2013-11-29 ENCOUNTER — Emergency Department (HOSPITAL_COMMUNITY)
Admission: EM | Admit: 2013-11-29 | Discharge: 2013-11-29 | Disposition: A | Discharge status: Home / Self Care | Payer: Medicaid Other | Attending: Emergency Medicine | Admitting: Emergency Medicine

## 2013-11-29 DIAGNOSIS — IMO0002 Reserved for concepts with insufficient information to code with codable children: Secondary | ICD-10-CM | POA: Insufficient documentation

## 2013-11-29 DIAGNOSIS — Z8619 Personal history of other infectious and parasitic diseases: Secondary | ICD-10-CM | POA: Insufficient documentation

## 2013-11-29 DIAGNOSIS — Z862 Personal history of diseases of the blood and blood-forming organs and certain disorders involving the immune mechanism: Secondary | ICD-10-CM | POA: Insufficient documentation

## 2013-11-29 DIAGNOSIS — N898 Other specified noninflammatory disorders of vagina: Secondary | ICD-10-CM

## 2013-11-29 DIAGNOSIS — Z3202 Encounter for pregnancy test, result negative: Secondary | ICD-10-CM | POA: Insufficient documentation

## 2013-11-29 DIAGNOSIS — Z872 Personal history of diseases of the skin and subcutaneous tissue: Secondary | ICD-10-CM | POA: Insufficient documentation

## 2013-11-29 DIAGNOSIS — Z9089 Acquired absence of other organs: Secondary | ICD-10-CM | POA: Insufficient documentation

## 2013-11-29 DIAGNOSIS — Z8639 Personal history of other endocrine, nutritional and metabolic disease: Secondary | ICD-10-CM | POA: Insufficient documentation

## 2013-11-29 DIAGNOSIS — F17200 Nicotine dependence, unspecified, uncomplicated: Secondary | ICD-10-CM | POA: Insufficient documentation

## 2013-11-29 DIAGNOSIS — Z79899 Other long term (current) drug therapy: Secondary | ICD-10-CM | POA: Insufficient documentation

## 2013-11-29 DIAGNOSIS — E669 Obesity, unspecified: Secondary | ICD-10-CM | POA: Insufficient documentation

## 2013-11-29 DIAGNOSIS — J45909 Unspecified asthma, uncomplicated: Secondary | ICD-10-CM | POA: Insufficient documentation

## 2013-11-29 LAB — URINALYSIS, ROUTINE W REFLEX MICROSCOPIC
Bilirubin Urine: NEGATIVE
Glucose, UA: NEGATIVE mg/dL
Hgb urine dipstick: NEGATIVE
Ketones, ur: NEGATIVE mg/dL
NITRITE: NEGATIVE
PH: 7 (ref 5.0–8.0)
Protein, ur: NEGATIVE mg/dL
SPECIFIC GRAVITY, URINE: 1.014 (ref 1.005–1.030)
Urobilinogen, UA: 1 mg/dL (ref 0.0–1.0)

## 2013-11-29 LAB — WET PREP, GENITAL
Clue Cells Wet Prep HPF POC: NONE SEEN
TRICH WET PREP: NONE SEEN
YEAST WET PREP: NONE SEEN

## 2013-11-29 LAB — RPR

## 2013-11-29 LAB — URINE MICROSCOPIC-ADD ON

## 2013-11-29 LAB — POC URINE PREG, ED: Preg Test, Ur: NEGATIVE

## 2013-11-29 LAB — HIV ANTIBODY (ROUTINE TESTING W REFLEX): HIV: NONREACTIVE

## 2013-11-29 MED ORDER — CEFTRIAXONE SODIUM 250 MG IJ SOLR
250.0000 mg | Freq: Once | INTRAMUSCULAR | Status: AC
  Administered 2013-11-29: 250 mg via INTRAMUSCULAR
  Filled 2013-11-29: qty 250

## 2013-11-29 MED ORDER — LIDOCAINE HCL (PF) 1 % IJ SOLN
INTRAMUSCULAR | Status: AC
  Administered 2013-11-29: 0.9 mL
  Filled 2013-11-29: qty 5

## 2013-11-29 MED ORDER — AZITHROMYCIN 250 MG PO TABS
1000.0000 mg | ORAL_TABLET | Freq: Once | ORAL | Status: AC
  Administered 2013-11-29: 1000 mg via ORAL
  Filled 2013-11-29: qty 4

## 2013-11-29 NOTE — Discharge Instructions (Signed)
Sexually Transmitted Disease A sexually transmitted disease (STD) is a disease or infection that may be passed (transmitted) from person to person, usually during sexual activity. This may happen by way of saliva, semen, blood, vaginal mucus, or urine. Common STDs include:   Gonorrhea.   Chlamydia.   Syphilis.   HIV and AIDS.   Genital herpes.   Hepatitis B and C.   Trichomonas.   Human papillomavirus (HPV).   Pubic lice.   Scabies.  Mites.  Bacterial vaginosis. WHAT ARE CAUSES OF STDs? An STD may be caused by bacteria, a virus, or parasites. STDs are often transmitted during sexual activity if one person is infected. However, they may also be transmitted through nonsexual means. STDs may be transmitted after:   Sexual intercourse with an infected person.   Sharing sex toys with an infected person.   Sharing needles with an infected person or using unclean piercing or tattoo needles.  Having intimate contact with the genitals, mouth, or rectal areas of an infected person.   Exposure to infected fluids during birth. WHAT ARE THE SIGNS AND SYMPTOMS OF STDs? Different STDs have different symptoms. Some people may not have any symptoms. If symptoms are present, they may include:   Painful or bloody urination.   Pain in the pelvis, abdomen, vagina, anus, throat, or eyes.   Skin rash, itching, irritation, growths, sores (lesions), ulcerations, or warts in the genital or anal area.  Abnormal vaginal discharge with or without bad odor.   Penile discharge in men.   Fever.   Pain or bleeding during sexual intercourse.   Swollen glands in the groin area.   Yellow skin and eyes (jaundice). This is seen with hepatitis.   Swollen testicles.  Infertility.  Sores and blisters in the mouth. HOW ARE STDs DIAGNOSED? To make a diagnosis, your health care provider may:   Take a medical history.   Perform a physical exam.   Take a sample of any  discharge for examination.  Swab the throat, cervix, opening to the penis, rectum, or vagina for testing.  Test a sample of your first morning urine.   Perform blood tests.   Perform a Pap smear, if this applies.   Perform a colposcopy.   Perform a laparoscopy.  HOW ARE STDs TREATED? Treatment depends on the STD. Some STDs may be treated but not cured.   Chlamydia, gonorrhea, trichomonas, and syphilis can be cured with antibiotics.   Genital herpes, hepatitis, and HIV can be treated, but not cured, with prescribed medicines. The medicines lessen symptoms.   Genital warts from HPV can be treated with medicine or by freezing, burning (electrocautery), or surgery. Warts may come back.   HPV cannot be cured with medicine or surgery. However, abnormal areas may be removed from the cervix, vagina, or vulva.   If your diagnosis is confirmed, your recent sexual partners need treatment. This is true even if they are symptom-free or have a negative culture or evaluation. They should not have sex until their health care providers say it is OK. HOW CAN I REDUCE MY RISK OF GETTING AN STD?  Use latex condoms, dental dams, and water-soluble lubricants during sexual activity. Do not use petroleum jelly or oils.  Get vaccinated for HPV and hepatitis. If you have not received these vaccines in the past, talk to your health care provider about whether one or both might be right for you.   Avoid risky sex practices that can break the skin.  WHAT SHOULD   I DO IF I THINK I HAVE AN STD?  See your health care provider.   Inform all sexual partners. They should be tested and treated for any STDs.  Do not have sex until your health care provider says it is OK. WHEN SHOULD I GET HELP? Seek immediate medical care if:  You develop severe abdominal pain.  You are a man and notice swelling or pain in the testicles.  You are a woman and notice swelling or pain in your vagina. Document  Released: 10/20/2002 Document Revised: 05/20/2013 Document Reviewed: 02/17/2013 [employer] Patient Information 2014 [employer].  

## 2013-11-29 NOTE — ED Provider Notes (Signed)
CSN: [id number]     Arrival date & time 11/29/13  0227 History   First MD Initiated Contact with Patient 11/29/13 [phone]     Chief Complaint  Patient presents with  . Vaginitis     (Consider location/radiation/quality/duration/timing/severity/associated sxs/prior Treatment) HPI HX per PT - vaginal discharge and discomfort x 2 days, h/o STD, is worried about the same. No f/C, no rash or lesions, c/o razor burn sensation. No known STD exposures. No ABD pain or back pain otherwise. Symptoms MOD in severity.   Past Medical History  Diagnosis Date  . Hypothyroidism   . History of chicken pox   . Dysmenorrhea   . Obese   . Bacterial infection   . Trichomonas   . Gonorrhea   . Asthma   . Allergic rhinitis     adenoidectomy  . Eczema    Past Surgical History  Procedure Laterality Date  . Adenoidectomy    . Wisdom tooth extraction     Family History  Problem Relation Age of Onset  . Arthritis Maternal Aunt   . Diabetes Maternal Uncle   . Hypertension Maternal Grandmother   . Cancer Maternal Grandfather     PROSTATE  . Asthma      neices and nephews  . Allergic rhinitis Brother   . Eczema      neices and nephews  . Eczema Daughter    History  Substance Use Topics  . Smoking status: Current Every Day Smoker -- 0.20 packs/day  . Smokeless tobacco: Never Used  . Alcohol Use: Yes     Comment: occasional   OB History   Grav Para Term Preterm Abortions TAB SAB Ect Mult Living   1 1 1       1      Review of Systems  Constitutional: Negative for fever and chills.  Respiratory: Negative for shortness of breath.   Cardiovascular: Negative for chest pain.  Gastrointestinal: Negative for vomiting and abdominal pain.  Genitourinary: Positive for vaginal discharge and pelvic pain. Negative for dysuria and vaginal bleeding.  Musculoskeletal: Negative for back pain.  Skin: Negative for rash.  Neurological: Negative for headaches.  All other systems reviewed and are  negative.     Allergies  Mold extract and Other  Home Medications   Prior to Admission medications   Medication Sig Start Date End Date Taking? Authorizing Provider  albuterol (PROVENTIL HFA;VENTOLIN HFA) 108 (90 BASE) MCG/ACT inhaler Inhale 2 puffs into the lungs every 4 (four) hours as needed. For asthma 06/05/13  Yes Olivia Mackielga M Otter, MD  budesonide-formoterol [facility](SYMBICORT) 160-4.5 MCG/ACT inhaler Inhale 2 puffs into the lungs daily.   Yes Historical Provider, MD  cetirizine (ZYRTEC) 10 MG tablet Take 10 mg by mouth daily.   Yes Historical Provider, MD  fluticasone (FLONASE) 50 MCG/ACT nasal spray Place 2 sprays into the nose daily.   Yes Historical Provider, MD  montelukast (SINGULAIR) 10 MG tablet Take 10 mg by mouth at bedtime.   Yes Historical Provider, MD  oxymetazoline (AFRIN) 0.05 % nasal spray Place 3 sprays into both nostrils daily as needed for congestion.   Yes Historical Provider, MD   BP 94/54  Pulse 67  Temp(Src) 98.4 F (36.9 C) (Oral)  Resp 16  Ht 5\' 10"  (1.778 m)  Wt 238 lb (107.956 kg)  BMI 34.15 kg/m2  SpO2 98%  LMP 11/10/2013 Physical Exam  Constitutional: She is oriented to person, place, and time. She appears well-developed and well-nourished.  HENT:  Head: Normocephalic and  atraumatic.  Eyes: EOM are normal. Pupils are equal, round, and reactive to light. No scleral icterus.  Neck: Neck supple.  Cardiovascular: Regular rhythm and intact distal pulses.   Pulmonary/Chest: Effort normal. No respiratory distress.  Abdominal: Soft. She exhibits no distension. There is no tenderness.  Genitourinary:  Normal ext GU - no rash or lesions, MOD clear vaginal discharge, no CMT or adnexal tenderness  Musculoskeletal: Normal range of motion. She exhibits no edema.  Neurological: She is alert and oriented to person, place, and time.  Skin: Skin is warm and dry.    ED Course  Procedures (including critical care time) Labs Review Labs Reviewed  WET PREP, GENITAL -  Abnormal; Notable for the following:    WBC, Wet Prep HPF POC FEW (*)    All other components within normal limits  URINALYSIS, ROUTINE W REFLEX MICROSCOPIC - Abnormal; Notable for the following:    Leukocytes, UA TRACE (*)    All other components within normal limits  URINE MICROSCOPIC-ADD ON - Abnormal; Notable for the following:    Squamous Epithelial / LPF FEW (*)    All other components within normal limits  GC/CHLAMYDIA PROBE AMP  RPR  HIV ANTIBODY (ROUTINE TESTING)  POC URINE PREG, ED   Rocephin and azithromycin provided.  Plan discharge home and followup with health department or primary care physician. Patient aware that GC Chlamydia results are pending. HIV and RPR test also pending. Covered for STDs. Patient advised to avoid sexual activity until she receives culture results.  MDM   Final diagnoses:  Vaginal discharge   Presents with vaginal discharge and strong history of previous STDs. Evaluated and treated for the same. Wet prep and urinalysis reviewed as above. Antibiotics provided. Vital signs and nursing notes reviewed and considered.    Sunnie NielsenBrian Doye Montilla, MD 11/30/13 [phone]

## 2013-11-29 NOTE — ED Notes (Signed)
Pt c/o lower abdominal pain and vaginal pain with excessive clear discharge. Pt reports recent unprotected sex. Denies bleeding. Positive dysuria. Also c/o razor burn. Pt is A&Ox4, respirations equal and unlabored, skin warm and dry.

## 2013-11-30 LAB — GC/CHLAMYDIA PROBE AMP
CT Probe RNA: NEGATIVE
GC Probe RNA: NEGATIVE

## 2014-01-02 ENCOUNTER — Emergency Department (HOSPITAL_COMMUNITY)
Admission: EM | Admit: 2014-01-02 | Discharge: 2014-01-02 | Disposition: A | Discharge status: Home / Self Care | Payer: Medicaid Other | Attending: Emergency Medicine | Admitting: Emergency Medicine

## 2014-01-02 ENCOUNTER — Emergency Department (HOSPITAL_COMMUNITY): Payer: Medicaid Other

## 2014-01-02 ENCOUNTER — Encounter (HOSPITAL_COMMUNITY): Payer: Self-pay | Admitting: Emergency Medicine

## 2014-01-02 DIAGNOSIS — S46909A Unspecified injury of unspecified muscle, fascia and tendon at shoulder and upper arm level, unspecified arm, initial encounter: Secondary | ICD-10-CM | POA: Insufficient documentation

## 2014-01-02 DIAGNOSIS — S59919A Unspecified injury of unspecified forearm, initial encounter: Secondary | ICD-10-CM

## 2014-01-02 DIAGNOSIS — M545 Low back pain, unspecified: Secondary | ICD-10-CM

## 2014-01-02 DIAGNOSIS — Z8742 Personal history of other diseases of the female genital tract: Secondary | ICD-10-CM | POA: Insufficient documentation

## 2014-01-02 DIAGNOSIS — Z872 Personal history of diseases of the skin and subcutaneous tissue: Secondary | ICD-10-CM | POA: Insufficient documentation

## 2014-01-02 DIAGNOSIS — Y9389 Activity, other specified: Secondary | ICD-10-CM | POA: Insufficient documentation

## 2014-01-02 DIAGNOSIS — E669 Obesity, unspecified: Secondary | ICD-10-CM | POA: Diagnosis not present

## 2014-01-02 DIAGNOSIS — S161XXA Strain of muscle, fascia and tendon at neck level, initial encounter: Secondary | ICD-10-CM

## 2014-01-02 DIAGNOSIS — S139XXA Sprain of joints and ligaments of unspecified parts of neck, initial encounter: Secondary | ICD-10-CM | POA: Diagnosis not present

## 2014-01-02 DIAGNOSIS — Z79899 Other long term (current) drug therapy: Secondary | ICD-10-CM | POA: Diagnosis not present

## 2014-01-02 DIAGNOSIS — S59909A Unspecified injury of unspecified elbow, initial encounter: Secondary | ICD-10-CM | POA: Insufficient documentation

## 2014-01-02 DIAGNOSIS — Y92410 Unspecified street and highway as the place of occurrence of the external cause: Secondary | ICD-10-CM | POA: Insufficient documentation

## 2014-01-02 DIAGNOSIS — Z8619 Personal history of other infectious and parasitic diseases: Secondary | ICD-10-CM | POA: Diagnosis not present

## 2014-01-02 DIAGNOSIS — R110 Nausea: Secondary | ICD-10-CM | POA: Diagnosis not present

## 2014-01-02 DIAGNOSIS — J45909 Unspecified asthma, uncomplicated: Secondary | ICD-10-CM | POA: Insufficient documentation

## 2014-01-02 DIAGNOSIS — S20219A Contusion of unspecified front wall of thorax, initial encounter: Secondary | ICD-10-CM

## 2014-01-02 DIAGNOSIS — F17200 Nicotine dependence, unspecified, uncomplicated: Secondary | ICD-10-CM | POA: Diagnosis not present

## 2014-01-02 DIAGNOSIS — S4980XA Other specified injuries of shoulder and upper arm, unspecified arm, initial encounter: Secondary | ICD-10-CM | POA: Insufficient documentation

## 2014-01-02 DIAGNOSIS — S6990XA Unspecified injury of unspecified wrist, hand and finger(s), initial encounter: Secondary | ICD-10-CM

## 2014-01-02 DIAGNOSIS — M79632 Pain in left forearm: Secondary | ICD-10-CM

## 2014-01-02 DIAGNOSIS — S199XXA Unspecified injury of neck, initial encounter: Secondary | ICD-10-CM | POA: Diagnosis present

## 2014-01-02 DIAGNOSIS — S0993XA Unspecified injury of face, initial encounter: Secondary | ICD-10-CM | POA: Diagnosis present

## 2014-01-02 DIAGNOSIS — S298XXA Other specified injuries of thorax, initial encounter: Secondary | ICD-10-CM | POA: Diagnosis not present

## 2014-01-02 DIAGNOSIS — IMO0002 Reserved for concepts with insufficient information to code with codable children: Secondary | ICD-10-CM | POA: Insufficient documentation

## 2014-01-02 MED ORDER — METHOCARBAMOL 500 MG PO TABS: 500.0000 mg | ORAL_TABLET | Freq: Two times a day (BID) | ORAL | Status: DC

## 2014-01-02 MED ORDER — HYDROCODONE-ACETAMINOPHEN 5-325 MG PO TABS: 1.0000 | ORAL_TABLET | Freq: Four times a day (QID) | ORAL | Status: DC | PRN

## 2014-01-02 MED ORDER — METHOCARBAMOL 500 MG PO TABS
500.0000 mg | ORAL_TABLET | Freq: Once | ORAL | Status: AC
  Administered 2014-01-02: 500 mg via ORAL
  Filled 2014-01-02: qty 1

## 2014-01-02 MED ORDER — NAPROXEN 500 MG PO TABS: 500.0000 mg | ORAL_TABLET | Freq: Two times a day (BID) | ORAL | Status: DC

## 2014-01-02 MED ORDER — OXYCODONE-ACETAMINOPHEN 5-325 MG PO TABS
2.0000 | ORAL_TABLET | Freq: Once | ORAL | Status: AC
  Administered 2014-01-02: 2 via ORAL
  Filled 2014-01-02: qty 2
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
## 2014-01-02 NOTE — ED Notes (Signed)
Pt has a ride home.  

## 2014-01-02 NOTE — Discharge Instructions (Signed)
Recommend naproxen and Robaxin as prescribed. Apply ice to areas of pain 3-4 times per day for at least 30 minutes each time. You may take Norco as prescribed for severe pain. Follow up with your primary care doctor.  Muscle Strain A muscle strain is an injury that occurs when a muscle is stretched beyond its normal length. Usually a small number of muscle fibers are torn when this happens. Muscle strain is rated in degrees. First-degree strains have the least amount of muscle fiber tearing and pain. Second-degree and third-degree strains have increasingly more tearing and pain.  Usually, recovery from muscle strain takes 1 2 weeks. Complete healing takes 5 6 weeks.  CAUSES  Muscle strain happens when a sudden, violent force placed on a muscle stretches it too far. This may occur with lifting, sports, or a fall.  RISK FACTORS Muscle strain is especially common in athletes.  SIGNS AND SYMPTOMS At the site of the muscle strain, there may be:  Pain.  Bruising.  Swelling.  Difficulty using the muscle due to pain or lack of normal function. DIAGNOSIS  Your health care provider will perform a physical exam and ask about your medical history. TREATMENT  Often, the best treatment for a muscle strain is resting, icing, and applying cold compresses to the injured area.  HOME CARE INSTRUCTIONS   Use the PRICE method of treatment to promote muscle healing during the first 2 3 days after your injury. The PRICE method involves:  Protecting the muscle from being injured again.  Restricting your activity and resting the injured body part.  Icing your injury. To do this, put ice in a plastic bag. Place a towel between your skin and the bag. Then, apply the ice and leave it on from 15 20 minutes each hour. After the third day, switch to moist heat packs.  Apply compression to the injured area with a splint or elastic bandage. Be careful not to wrap it too tightly. This may interfere with blood  circulation or increase swelling.  Elevate the injured body part above the level of your heart as often as you can.  Only take over-the-counter or prescription medicines for pain, discomfort, or fever as directed by your health care provider.  Warming up prior to exercise helps to prevent future muscle strains. SEEK MEDICAL CARE IF:   You have increasing pain or swelling in the injured area.  You have numbness, tingling, or a significant loss of strength in the injured area. MAKE SURE YOU:   Understand these instructions.  Will watch your condition.  Will get help right away if you are not doing well or get worse. Document Released: 07/30/2005 Document Revised: 05/20/2013 Document Reviewed: 02/26/2013 [facility] Patient Information 2014 [place], [place].
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
## 2014-01-03 NOTE — ED Provider Notes (Signed)
Medical screening examination/treatment/procedure(s) were performed by non-physician practitioner and as supervising physician I was immediately available for consultation/collaboration.   EKG Interpretation None        Junius Argyle, MD 01/03/14 1144

## 2014-02-13 ENCOUNTER — Encounter (HOSPITAL_COMMUNITY): Payer: Self-pay | Admitting: *Deleted

## 2014-02-13 ENCOUNTER — Inpatient Hospital Stay (HOSPITAL_COMMUNITY)
Admission: AD | Admit: 2014-02-13 | Discharge: 2014-02-13 | Disposition: A | Discharge status: Home / Self Care | Payer: Medicaid Other | Source: Ambulatory Visit | Attending: Obstetrics & Gynecology | Admitting: Obstetrics & Gynecology

## 2014-02-13 DIAGNOSIS — O210 Mild hyperemesis gravidarum: Secondary | ICD-10-CM | POA: Diagnosis not present

## 2014-02-13 DIAGNOSIS — K5900 Constipation, unspecified: Secondary | ICD-10-CM | POA: Diagnosis not present

## 2014-02-13 DIAGNOSIS — R1031 Right lower quadrant pain: Secondary | ICD-10-CM | POA: Diagnosis present

## 2014-02-13 DIAGNOSIS — O99280 Endocrine, nutritional and metabolic diseases complicating pregnancy, unspecified trimester: Secondary | ICD-10-CM

## 2014-02-13 DIAGNOSIS — O9989 Other specified diseases and conditions complicating pregnancy, childbirth and the puerperium: Principal | ICD-10-CM

## 2014-02-13 DIAGNOSIS — E079 Disorder of thyroid, unspecified: Secondary | ICD-10-CM | POA: Diagnosis not present

## 2014-02-13 DIAGNOSIS — O99891 Other specified diseases and conditions complicating pregnancy: Secondary | ICD-10-CM | POA: Diagnosis not present

## 2014-02-13 DIAGNOSIS — E039 Hypothyroidism, unspecified: Secondary | ICD-10-CM | POA: Insufficient documentation

## 2014-02-13 DIAGNOSIS — O99330 Smoking (tobacco) complicating pregnancy, unspecified trimester: Secondary | ICD-10-CM | POA: Diagnosis not present

## 2014-02-13 DIAGNOSIS — R112 Nausea with vomiting, unspecified: Secondary | ICD-10-CM | POA: Diagnosis present

## 2014-02-13 DIAGNOSIS — R109 Unspecified abdominal pain: Secondary | ICD-10-CM

## 2014-02-13 DIAGNOSIS — O26899 Other specified pregnancy related conditions, unspecified trimester: Secondary | ICD-10-CM

## 2014-02-13 LAB — COMPREHENSIVE METABOLIC PANEL
ALBUMIN: 3.1 g/dL — AB (ref 3.5–5.2)
ALT: 10 U/L (ref 0–35)
ANION GAP: 8 (ref 5–15)
AST: 14 U/L (ref 0–37)
Alkaline Phosphatase: 49 U/L (ref 39–117)
BUN: 7 mg/dL (ref 6–23)
CO2: 26 mEq/L (ref 19–32)
CREATININE: 0.69 mg/dL (ref 0.50–1.10)
Calcium: 9.3 mg/dL (ref 8.4–10.5)
Chloride: 101 mEq/L (ref 96–112)
GFR calc Af Amer: >90 mL/min (ref >90)
GFR calc non Af Amer: >90 mL/min (ref >90)
Glucose, Bld: 86 mg/dL (ref 70–99)
POTASSIUM: 4.4 meq/L (ref 3.7–5.3)
Sodium: 135 mEq/L — ABNORMAL LOW (ref 137–147)
TOTAL PROTEIN: 6.4 g/dL (ref 6.0–8.3)
Total Bilirubin: 0.6 mg/dL (ref 0.3–1.2)

## 2014-02-13 LAB — CBC WITH DIFFERENTIAL/PLATELET
Basophils Absolute: 0 10*3/uL (ref 0.0–0.1)
Basophils Relative: 0 % (ref 0–1)
Eosinophils Absolute: 0.1 10*3/uL (ref 0.0–0.7)
Eosinophils Relative: 2 % (ref 0–5)
HEMATOCRIT: 35.3 % — AB (ref 36.0–46.0)
HEMOGLOBIN: 12.4 g/dL (ref 12.0–15.0)
LYMPHS ABS: 1.5 10*3/uL (ref 0.7–4.0)
LYMPHS PCT: 38 % (ref 12–46)
MCH: 30.2 pg (ref 26.0–34.0)
MCHC: 35.1 g/dL (ref 30.0–36.0)
MCV: 85.9 fL (ref 78.0–100.0)
MONOS PCT: 8 % (ref 3–12)
Monocytes Absolute: 0.3 10*3/uL (ref 0.1–1.0)
NEUTROS ABS: 2.1 10*3/uL (ref 1.7–7.7)
Neutrophils Relative %: 52 % (ref 43–77)
Platelets: 249 10*3/uL (ref 150–400)
RBC: 4.11 MIL/uL (ref 3.87–5.11)
RDW: 13.5 % (ref 11.5–15.5)
WBC: 3.9 10*3/uL — AB (ref 4.0–10.5)

## 2014-02-13 LAB — OB RESULTS CONSOLE GC/CHLAMYDIA
Chlamydia: NEGATIVE
GC PROBE AMP, GENITAL: NEGATIVE

## 2014-02-13 LAB — WET PREP, GENITAL
Trich, Wet Prep: NONE SEEN
YEAST WET PREP: NONE SEEN

## 2014-02-13 MED ORDER — PROMETHAZINE HCL 25 MG PO TABS: 25.0000 mg | ORAL_TABLET | Freq: Four times a day (QID) | ORAL | Status: DC | PRN

## 2014-02-13 NOTE — MAU Note (Addendum)
Arrived via EMS. C/O pain right side, points to RLQ and up to right mid abdomen. Pain comes and goes and is sharp. States she has had this pain for about 2 weeks. Worse since 2 days ago. Vomiting X 2 weeks. States she eats once a day. Thick white vaginal discharge, no bleeding. No BM for 2 weeks.

## 2014-02-13 NOTE — Discharge Instructions (Signed)
Get some rectal suppositories to help you go to the bathroom.  Use miralax or an over the counter stool softener to help you have a BM regularly  Keep your appointment in the office this week.

## 2014-02-13 NOTE — MAU Provider Note (Signed)
History     CSN: [id number]  Arrival date and time: 02/13/14 1137   First Provider Initiated Contact with Patient 02/13/14 1159      Chief Complaint  Patient presents with  . Abdominal Pain   HPI Gwendolyn Clay 28 y.o. [id number]  Comes to MAU by EMS with RLQ pain which radiates to RUQ.  Has had the pain for 2 weeks but it is worse today.  Also has had periodic vomiting.  Has not yet seen a doctor for this pregnancy but has an appointment later this week with [facility].  Is worried about an ectopic pregnancy due to the pain.   OB History   Grav Para Term Preterm Abortions TAB SAB Ect Mult Living   2 1 1       1       Past Medical History  Diagnosis Date  . Hypothyroidism   . History of chicken pox   . Dysmenorrhea   . Obese   . Bacterial infection   . Trichomonas   . Gonorrhea   . Asthma   . Allergic rhinitis     adenoidectomy  . Eczema     Past Surgical History  Procedure Laterality Date  . Adenoidectomy    . Wisdom tooth extraction      Family History  Problem Relation Age of Onset  . Arthritis Maternal Aunt   . Diabetes Maternal Uncle   . Hypertension Maternal Grandmother   . Cancer Maternal Grandfather     PROSTATE  . Asthma      neices and nephews  . Allergic rhinitis Brother   . Eczema      neices and nephews  . Eczema Daughter     History  Substance Use Topics  . Smoking status: Current Every Day Smoker -- 0.20 packs/day    Types: Cigarettes  . Smokeless tobacco: Never Used  . Alcohol Use: Yes     Comment: occasional    Allergies:  Allergies  Allergen Reactions  . Mold Extract [Trichophyton] Other (See Comments)    Wheezing   . Other     Roaches cause wheezing     Prescriptions prior to admission  Medication Sig Dispense Refill  . budesonide-formoterol (SYMBICORT) 160-4.5 MCG/ACT inhaler Inhale 2 puffs into the lungs daily.      Marland Kitchen. ibuprofen (ADVIL,MOTRIN) 200 MG tablet Take 400 mg by mouth every 6 (six) hours as needed for moderate  pain.      Marland Kitchen. OVER THE COUNTER MEDICATION Take 1 tablet by mouth 2 (two) times daily. Keranique  Hair, skin and nails vitamin      . oxymetazoline (AFRIN) 0.05 % nasal spray Place 3 sprays into both nostrils daily as needed for congestion.      Marland Kitchen. albuterol (PROVENTIL HFA;VENTOLIN HFA) 108 (90 BASE) MCG/ACT inhaler Inhale 2 puffs into the lungs every 4 (four) hours as needed. For asthma  1 Inhaler  0    Review of Systems  Constitutional: Negative for fever.  Gastrointestinal: Positive for nausea, vomiting, abdominal pain and constipation. Negative for diarrhea.  Genitourinary:       No vaginal discharge. No vaginal bleeding. No dysuria.   Physical Exam   Blood pressure 99/53, pulse 104, temperature 98.4 F (36.9 C), temperature source Oral, resp. rate 28, last menstrual period 12/07/2013, SpO2 100.00%.  Physical Exam  Nursing note and vitals reviewed. Constitutional: She is oriented to person, place, and time. She appears well-developed and well-nourished.  HENT:  Head: Normocephalic.  Eyes: EOM  are normal.  Neck: Neck supple.  GI: Soft. Bowel sounds are normal. She exhibits no distension. There is tenderness. There is guarding. There is no rebound.  FHT 170 with doppler  Genitourinary:  .Speculum exam: Vagina - Small amount of creamy discharge, no odor Cervix - No contact bleeding Bimanual exam: Cervix closed Uterus 10w  size Adnexa non tender, no masses bilaterally, Client very tender over rectum and lumpy hard stool palpated in rectum GC/Chlam, wet prep done Chaperone present for exam.  Musculoskeletal: Normal range of motion.  Neurological: She is alert and oriented to person, place, and time.  Skin: Skin is warm and dry.  Psychiatric: She has a normal mood and affect.    MAU Course  Procedures  MDM Results for orders placed during the hospital encounter of 02/13/14 (from the past 24 hour(s))  COMPREHENSIVE METABOLIC PANEL     Status: Abnormal   Collection Time     02/13/14 12:07 PM      Result Value Ref Range   Sodium 135 (*) 137 - 147 mEq/L   Potassium 4.4  3.7 - 5.3 mEq/L   Chloride 101  96 - 112 mEq/L   CO2 26  19 - 32 mEq/L   Glucose, Bld 86  70 - 99 mg/dL   BUN 7  6 - 23 mg/dL   Creatinine, Ser 0.100.69  0.50 - 1.10 mg/dL   Calcium 9.3  8.4 - 27.210.5 mg/dL   Total Protein 6.4  6.0 - 8.3 g/dL   Albumin 3.1 (*) 3.5 - 5.2 g/dL   AST 14  0 - 37 U/L   ALT 10  0 - 35 U/L   Alkaline Phosphatase 49  39 - 117 U/L   Total Bilirubin 0.6  0.3 - 1.2 mg/dL   GFR calc non Af Amer >90  >90 mL/min   GFR calc Af Amer >90  >90 mL/min   Anion gap 8  5 - 15  CBC WITH DIFFERENTIAL     Status: Abnormal   Collection Time    02/13/14 12:07 PM      Result Value Ref Range   WBC 3.9 (*) 4.0 - 10.5 K/uL   RBC 4.11  3.87 - 5.11 MIL/uL   Hemoglobin 12.4  12.0 - 15.0 g/dL   HCT 53.635.3 (*) 64.436.0 - 03.446.0 %   MCV 85.9  78.0 - 100.0 fL   MCH 30.2  26.0 - 34.0 pg   MCHC 35.1  30.0 - 36.0 g/dL   RDW 74.213.5  59.511.5 - 63.815.5 %   Platelets 249  150 - 400 K/uL   Neutrophils Relative % 52  43 - 77 %   Neutro Abs 2.1  1.7 - 7.7 K/uL   Lymphocytes Relative 38  12 - 46 %   Lymphs Abs 1.5  0.7 - 4.0 K/uL   Monocytes Relative 8  3 - 12 %   Monocytes Absolute 0.3  0.1 - 1.0 K/uL   Eosinophils Relative 2  0 - 5 %   Eosinophils Absolute 0.1  0.0 - 0.7 K/uL   Basophils Relative 0  0 - 1 %   Basophils Absolute 0.0  0.0 - 0.1 K/uL  WET PREP, GENITAL     Status: Abnormal   Collection Time    02/13/14 12:21 PM      Result Value Ref Range   Yeast Wet Prep HPF POC NONE SEEN  NONE SEEN   Trich, Wet Prep NONE SEEN  NONE SEEN   Clue Cells  Wet Prep HPF POC MODERATE (*) NONE SEEN   WBC, Wet Prep HPF POC FEW (*) NONE SEEN     Assessment and Plan  Constipation Nausea Abdominal pain  Plan Get some rectal suppositories to help you go to the bathroom. Use miralax or an over the counter stool softener to help you have a BM regularly Keep your appointment in the office this week. Rx phenergan  for nausea. Marvon Shillingburg 02/13/2014, 12:50 PM

## 2014-02-15 LAB — GC/CHLAMYDIA PROBE AMP
CT Probe RNA: NEGATIVE
GC Probe RNA: NEGATIVE

## 2014-02-17 ENCOUNTER — Encounter: Payer: Self-pay | Admitting: Obstetrics

## 2014-03-11 ENCOUNTER — Encounter: Payer: Self-pay | Admitting: Obstetrics

## 2014-03-11 ENCOUNTER — Ambulatory Visit (INDEPENDENT_AMBULATORY_CARE_PROVIDER_SITE_OTHER): Payer: Medicaid Other | Admitting: Obstetrics

## 2014-03-11 ENCOUNTER — Telehealth: Payer: Self-pay

## 2014-03-11 ENCOUNTER — Telehealth: Payer: Self-pay | Admitting: *Deleted

## 2014-03-11 VITALS — BP 91/64 | HR 82 | Temp 97.9°F | Wt 226.0 lb

## 2014-03-11 DIAGNOSIS — E079 Disorder of thyroid, unspecified: Secondary | ICD-10-CM

## 2014-03-11 DIAGNOSIS — O99280 Endocrine, nutritional and metabolic diseases complicating pregnancy, unspecified trimester: Secondary | ICD-10-CM

## 2014-03-11 DIAGNOSIS — E039 Hypothyroidism, unspecified: Secondary | ICD-10-CM

## 2014-03-11 DIAGNOSIS — E0590 Thyrotoxicosis, unspecified without thyrotoxic crisis or storm: Secondary | ICD-10-CM

## 2014-03-11 DIAGNOSIS — O210 Mild hyperemesis gravidarum: Secondary | ICD-10-CM

## 2014-03-11 DIAGNOSIS — O219 Vomiting of pregnancy, unspecified: Secondary | ICD-10-CM

## 2014-03-11 DIAGNOSIS — O0990 Supervision of high risk pregnancy, unspecified, unspecified trimester: Secondary | ICD-10-CM

## 2014-03-11 DIAGNOSIS — K5900 Constipation, unspecified: Secondary | ICD-10-CM

## 2014-03-11 DIAGNOSIS — Z3201 Encounter for pregnancy test, result positive: Secondary | ICD-10-CM

## 2014-03-11 DIAGNOSIS — Z3482 Encounter for supervision of other normal pregnancy, second trimester: Secondary | ICD-10-CM

## 2014-03-11 DIAGNOSIS — O99281 Endocrine, nutritional and metabolic diseases complicating pregnancy, first trimester: Secondary | ICD-10-CM

## 2014-03-11 DIAGNOSIS — Z3480 Encounter for supervision of other normal pregnancy, unspecified trimester: Secondary | ICD-10-CM

## 2014-03-11 LAB — POCT URINALYSIS DIPSTICK
GLUCOSE UA: NEGATIVE
Ketones, UA: NEGATIVE
Nitrite, UA: NEGATIVE
Spec Grav, UA: 1.015
UROBILINOGEN UA: NEGATIVE
pH, UA: 6.5

## 2014-03-11 LAB — POCT URINE PREGNANCY: PREG TEST UR: POSITIVE

## 2014-03-11 MED ORDER — LEVOTHYROXINE SODIUM 175 MCG PO TABS: 175.0000 ug | ORAL_TABLET | Freq: Every day | ORAL | Status: DC

## 2014-03-11 MED ORDER — DOXYLAMINE-PYRIDOXINE 10-10 MG PO TBEC: DELAYED_RELEASE_TABLET | ORAL | Status: DC

## 2014-03-11 MED ORDER — DOCUSATE SODIUM 100 MG PO CAPS
100.0000 mg | ORAL_CAPSULE | Freq: Two times a day (BID) | ORAL | Status: DC
Start: 2014-03-11 — End: 2014-05-07

## 2014-03-11 NOTE — Telephone Encounter (Signed)
SPOKE WITH PATIENT AND GAVE HER APPT TIME AND DATE FOR [facility] - 8/6/ 9AM

## 2014-03-11 NOTE — Telephone Encounter (Signed)
Placed call to pt to discuss lab results from a hospital encounter.  Pt needs to be treated for BV if not previously treated. Left message on voicemail for pt to contact office.

## 2014-03-12 ENCOUNTER — Encounter: Payer: Self-pay | Admitting: Obstetrics

## 2014-03-12 LAB — OBSTETRIC PANEL
Antibody Screen: NEGATIVE
BASOS ABS: 0 10*3/uL (ref 0.0–0.1)
BASOS PCT: 1 % (ref 0–1)
Eosinophils Absolute: 0 10*3/uL (ref 0.0–0.7)
Eosinophils Relative: 1 % (ref 0–5)
HEMATOCRIT: 37.8 % (ref 36.0–46.0)
Hemoglobin: 12.9 g/dL (ref 12.0–15.0)
Hepatitis B Surface Ag: NEGATIVE
Lymphocytes Relative: 39 % (ref 12–46)
Lymphs Abs: 1.8 10*3/uL (ref 0.7–4.0)
MCH: 29.5 pg (ref 26.0–34.0)
MCHC: 34.1 g/dL (ref 30.0–36.0)
MCV: 86.5 fL (ref 78.0–100.0)
MONO ABS: 0.4 10*3/uL (ref 0.1–1.0)
Monocytes Relative: 8 % (ref 3–12)
Neutro Abs: 2.3 10*3/uL (ref 1.7–7.7)
Neutrophils Relative %: 51 % (ref 43–77)
PLATELETS: 306 10*3/uL (ref 150–400)
RBC: 4.37 MIL/uL (ref 3.87–5.11)
RDW: 13.7 % (ref 11.5–15.5)
Rh Type: POSITIVE
Rubella: 9.54 Index — ABNORMAL HIGH (ref <0.90)
WBC: 4.6 10*3/uL (ref 4.0–10.5)

## 2014-03-12 LAB — CULTURE, OB URINE
Colony Count: NO GROWTH
Organism ID, Bacteria: NO GROWTH

## 2014-03-12 LAB — TSH: TSH: 6.208 u[IU]/mL — AB (ref 0.350–4.500)

## 2014-03-12 LAB — VITAMIN D 25 HYDROXY (VIT D DEFICIENCY, FRACTURES): VIT D 25 HYDROXY: 14 ng/mL — AB (ref 30–89)

## 2014-03-12 LAB — VARICELLA ZOSTER ANTIBODY, IGG: Varicella IgG: >4000 Index — ABNORMAL HIGH (ref <135.00)

## 2014-03-12 LAB — HIV ANTIBODY (ROUTINE TESTING W REFLEX): HIV: NONREACTIVE

## 2014-03-12 NOTE — Progress Notes (Signed)
  Subjective:    Gwendolyn Clay is a 28 y.o. female being seen today for her obstetrical visit. She is at 2769w4d gestation. Patient reports: no complaints.  Problem List Items Addressed This Visit   None    Visit Diagnoses   Unspecified high-risk pregnancy    -  Primary    Encounter for supervision of other normal pregnancy in second trimester        Relevant Orders       Obstetric panel       HIV antibody       Hemoglobinopathy evaluation       Varicella zoster antibody, IgG       Vit D  25 hydroxy (rtn osteoporosis monitoring)       Culture, OB Urine       POCT urine pregnancy (Completed)       TSH       POCT urinalysis dipstick (Completed)    Hypothyroidism affecting pregnancy in first trimester, antepartum        Relevant Medications       levothyroxine (SYNTHROID, LEVOTHROID) tablet    Other Relevant Orders       Ambulatory referral to Endocrinology    Nausea and vomiting during pregnancy prior to [redacted] weeks gestation        Relevant Medications       Doxylamine-Pyridoxine (DICLEGIS) 10-10 MG TBEC    Constipation, unspecified constipation type        Relevant Medications       docusate sodium (COLACE) capsule      Patient Active Problem List   Diagnosis Date Noted  . Nausea with vomiting 02/13/2014  . Back pain 08/02/2012  . Status asthmaticus 08/02/2012  . Allergic rhinitis   . Eczema   . Asthma   . Hypothyroidism   . Obese     Objective:     BP 91/64  Pulse 82  Temp(Src) 97.9 F (36.6 C)  Wt 226 lb (102.513 kg)  LMP 12/07/2013 Uterine Size: Below umbilicus     Assessment:    Pregnancy @ 3969w4d  weeks Doing well    Plan:    Problem list reviewed and updated. Labs reviewed.  Follow up in 4 weeks. FIRST/CF mutation testing/NIPT/QUAD SCREEN/fragile X/Ashkenazi Jewish population testing/Spinal muscular atrophy discussed: requested. Role of ultrasound in pregnancy discussed; fetal survey: requested. Amniocentesis discussed: not indicated.

## 2014-03-15 LAB — HEMOGLOBINOPATHY EVALUATION
HGB A2 QUANT: 2.8 % (ref 2.2–3.2)
HGB F QUANT: 0 % (ref 0.0–2.0)
Hemoglobin Other: 0 %
Hgb A: 97.2 % (ref 96.8–97.8)
Hgb S Quant: 0 %

## 2014-03-16 ENCOUNTER — Other Ambulatory Visit: Payer: Self-pay | Admitting: *Deleted

## 2014-03-16 DIAGNOSIS — N760 Acute vaginitis: Principal | ICD-10-CM

## 2014-03-16 DIAGNOSIS — B9689 Other specified bacterial agents as the cause of diseases classified elsewhere: Secondary | ICD-10-CM

## 2014-03-16 MED ORDER — METRONIDAZOLE 500 MG PO TABS: 500.0000 mg | ORAL_TABLET | Freq: Two times a day (BID) | ORAL | Status: DC

## 2014-03-16 NOTE — Telephone Encounter (Signed)
Patient had not been notified of Lab results and was not given treatment at the hospital. Per Nursing protocol Metronidazole sent to pharmacy.

## 2014-03-18 ENCOUNTER — Encounter: Payer: Self-pay | Admitting: *Deleted

## 2014-03-18 ENCOUNTER — Ambulatory Visit: Payer: Medicaid Other | Admitting: Endocrinology

## 2014-03-25 ENCOUNTER — Encounter: Payer: No Typology Code available for payment source | Admitting: Obstetrics

## 2014-03-30 ENCOUNTER — Ambulatory Visit (INDEPENDENT_AMBULATORY_CARE_PROVIDER_SITE_OTHER): Payer: Medicaid Other | Admitting: Obstetrics

## 2014-03-30 VITALS — BP 104/70 | HR 83 | Temp 98.1°F | Wt 229.0 lb

## 2014-03-30 DIAGNOSIS — Z3480 Encounter for supervision of other normal pregnancy, unspecified trimester: Secondary | ICD-10-CM

## 2014-03-30 DIAGNOSIS — Z3482 Encounter for supervision of other normal pregnancy, second trimester: Secondary | ICD-10-CM

## 2014-03-30 DIAGNOSIS — N390 Urinary tract infection, site not specified: Secondary | ICD-10-CM

## 2014-03-31 ENCOUNTER — Encounter: Payer: Self-pay | Admitting: Obstetrics

## 2014-03-31 LAB — POCT URINALYSIS DIPSTICK
Bilirubin, UA: NEGATIVE
Blood, UA: NEGATIVE
Glucose, UA: 250
Ketones, UA: NEGATIVE
Leukocytes, UA: NEGATIVE
Nitrite, UA: NEGATIVE
SPEC GRAV UA: 1.015
UROBILINOGEN UA: NEGATIVE
pH, UA: 6

## 2014-03-31 LAB — AFP, QUAD SCREEN
AFP: 27.4 [IU]/mL
Curr Gest Age: 16.1 wks.days
HCG, Total: 17600 m[IU]/mL
INH: 144.2 pg/mL
INTERPRETATION-AFP: NEGATIVE
MOM FOR INH: 0.95
MoM for AFP: 1.06
MoM for hCG: 0.9
Open Spina bifida: NEGATIVE
Osb Risk: 1:21800 {titer}
Tri 18 Scr Risk Est: NEGATIVE
Trisomy 18 (Edward) Syndrome Interp.: 1:20200 {titer}
UE3 MOM: 0.72
uE3 Value: 0.3 ng/mL

## 2014-03-31 NOTE — Progress Notes (Signed)
  Subjective:    Gwendolyn Clay is a 28 y.o. female being seen today for her obstetrical visit. She is at 951w2d gestation. Patient reports: no complaints.  Problem List Items Addressed This Visit   None    Visit Diagnoses   Encounter for supervision of other normal pregnancy in second trimester    -  Primary    Relevant Orders       POCT urinalysis dipstick       US OB Comp + 14 Wk       AFP, Quad Screen       Culture, OB Urine      Patient Active Problem List   Diagnosis Date Noted  . Nausea with vomiting 02/13/2014  . Back pain 08/02/2012  . Status asthmaticus 08/02/2012  . Allergic rhinitis   . Eczema   . Asthma   . Hypothyroidism   . Obese     Objective:     BP 104/70  Pulse 83  Temp(Src) 98.1 F (36.7 C)  Wt 229 lb (103.874 kg)  LMP 12/07/2013 Uterine Size: Below umbilicus     Assessment:    Pregnancy @ 171w2d  weeks Doing well    Plan:    Problem list reviewed and updated. Labs reviewed.  Follow up in 4 weeks. FIRST/CF mutation testing/NIPT/QUAD SCREEN/fragile X/Ashkenazi Jewish population testing/Spinal muscular atrophy discussed: requested. Role of ultrasound in pregnancy discussed; fetal survey: requested. Amniocentesis discussed: not indicated.

## 2014-04-02 ENCOUNTER — Ambulatory Visit (HOSPITAL_COMMUNITY)
Admission: RE | Admit: 2014-04-02 | Discharge: 2014-04-02 | Disposition: A | Discharge status: Home / Self Care | Payer: Medicaid Other | Source: Ambulatory Visit | Attending: Obstetrics | Admitting: Obstetrics

## 2014-04-02 DIAGNOSIS — Z3689 Encounter for other specified antenatal screening: Secondary | ICD-10-CM

## 2014-04-02 DIAGNOSIS — Z3482 Encounter for supervision of other normal pregnancy, second trimester: Secondary | ICD-10-CM

## 2014-04-02 LAB — CULTURE, OB URINE

## 2014-04-02 MED ORDER — CEPHALEXIN 500 MG PO CAPS: 500.0000 mg | ORAL_CAPSULE | Freq: Two times a day (BID) | ORAL | Status: DC

## 2014-04-02 NOTE — Addendum Note (Signed)
Addended by: Coral CeoHARPER, Wahneta Derocher A on: 04/02/2014 02:06 PM   Modules accepted: Orders

## 2014-04-08 ENCOUNTER — Telehealth: Payer: Self-pay | Admitting: *Deleted

## 2014-04-08 ENCOUNTER — Other Ambulatory Visit: Payer: Self-pay | Admitting: *Deleted

## 2014-04-08 DIAGNOSIS — N390 Urinary tract infection, site not specified: Secondary | ICD-10-CM

## 2014-04-08 MED ORDER — LEVOTHYROXINE SODIUM 175 MCG PO TABS: 175.0000 ug | ORAL_TABLET | Freq: Every day | ORAL | Status: DC

## 2014-04-08 MED ORDER — AMOXICILLIN 500 MG PO CAPS: 500.0000 mg | ORAL_CAPSULE | Freq: Three times a day (TID) | ORAL | Status: DC

## 2014-04-08 NOTE — Telephone Encounter (Signed)
Error

## 2014-04-08 NOTE — Telephone Encounter (Signed)
Patient called regarding lab results and a need for her Levothyroxine to be sent to a different pharmacy because she is out of town. Patient notified that she had UTI and that per Dr. Clearance Coots we would send her amoxicillin to the pharmacy that she request and that if [facility] is [facility] that she could just go there to get her levothyroxine refilled because [facility] town. Patient state she would like [facility] [address] in [place], [place]. Tried to send Rx but it was [facility]. So I sent both prescriptions to the [facility] for the patient.

## 2014-04-27 ENCOUNTER — Encounter: Payer: Medicaid Other | Admitting: Obstetrics

## 2014-04-30 ENCOUNTER — Inpatient Hospital Stay (HOSPITAL_COMMUNITY)
Admission: AD | Admit: 2014-04-30 | Discharge: 2014-05-01 | Disposition: A | Discharge status: Home / Self Care | Payer: Medicaid Other | Source: Ambulatory Visit | Attending: Obstetrics | Admitting: Obstetrics

## 2014-04-30 ENCOUNTER — Encounter (HOSPITAL_COMMUNITY): Payer: Self-pay | Admitting: *Deleted

## 2014-04-30 DIAGNOSIS — K5900 Constipation, unspecified: Secondary | ICD-10-CM | POA: Insufficient documentation

## 2014-04-30 DIAGNOSIS — O2390 Unspecified genitourinary tract infection in pregnancy, unspecified trimester: Secondary | ICD-10-CM | POA: Diagnosis not present

## 2014-04-30 DIAGNOSIS — Z87891 Personal history of nicotine dependence: Secondary | ICD-10-CM | POA: Insufficient documentation

## 2014-04-30 DIAGNOSIS — O99612 Diseases of the digestive system complicating pregnancy, second trimester: Secondary | ICD-10-CM

## 2014-04-30 DIAGNOSIS — R109 Unspecified abdominal pain: Secondary | ICD-10-CM | POA: Diagnosis present

## 2014-04-30 DIAGNOSIS — O26899 Other specified pregnancy related conditions, unspecified trimester: Secondary | ICD-10-CM

## 2014-04-30 DIAGNOSIS — O99891 Other specified diseases and conditions complicating pregnancy: Secondary | ICD-10-CM | POA: Insufficient documentation

## 2014-04-30 DIAGNOSIS — O9989 Other specified diseases and conditions complicating pregnancy, childbirth and the puerperium: Principal | ICD-10-CM

## 2014-04-30 DIAGNOSIS — N390 Urinary tract infection, site not specified: Secondary | ICD-10-CM | POA: Diagnosis not present

## 2014-04-30 LAB — URINALYSIS, ROUTINE W REFLEX MICROSCOPIC
Glucose, UA: NEGATIVE mg/dL
Ketones, ur: 15 mg/dL — AB
LEUKOCYTES UA: NEGATIVE
Nitrite: NEGATIVE
PROTEIN: NEGATIVE mg/dL
Specific Gravity, Urine: 1.025 (ref 1.005–1.030)
UROBILINOGEN UA: 0.2 mg/dL (ref 0.0–1.0)
pH: 6 (ref 5.0–8.0)

## 2014-04-30 LAB — URINE MICROSCOPIC-ADD ON

## 2014-04-30 MED ORDER — FLEET ENEMA 7-19 GM/118ML RE ENEM
1.0000 | ENEMA | Freq: Once | RECTAL | Status: AC
  Administered 2014-04-30: 1 via RECTAL

## 2014-04-30 NOTE — MAU Provider Note (Signed)
History     CSN: [id number]  Arrival date and time: 04/30/14 2302   None     Chief Complaint  Patient presents with  . Abdominal Cramping   HPI  Gwendolyn Clay is a 28 y.o. female G2P1001 at [redacted]w[redacted]d who presents with left- mid sided abdomina pain/cramping that started a few hours ago; the patient was at work when it started. This is a new complaint. Patient has not had a normal BM in over a week. She rates her pain 7/10. No new sex partners; pt is not concerned for GC. She does have a thick, white discharge that is odorless. She also describes irritation when she urinates.   OB History   Grav Para Term Preterm Abortions TAB SAB Ect Mult Living   Past Medical History  Diagnosis Date  . Hypothyroidism   . History of chicken pox   . Dysmenorrhea   . Obese   . Bacterial infection   . Trichomonas   . Gonorrhea   . Asthma   . Allergic rhinitis     adenoidectomy  . Eczema     Past Surgical History  Procedure Laterality Date  . Adenoidectomy    . Wisdom tooth extraction      Family History  Problem Relation Age of Onset  . Arthritis Maternal Aunt   . Diabetes Maternal Uncle   . Hypertension Maternal Grandmother   . Cancer Maternal Grandfather     PROSTATE  . Asthma      neices and nephews  . Allergic rhinitis Brother   . Eczema      neices and nephews  . Eczema Daughter     History  Substance Use Topics  . Smoking status: Former Smoker -- 0.20 packs/day    Types: Cigarettes    Quit date: 03/30/2014  . Smokeless tobacco: Never Used  . Alcohol Use: No     Comment: occasional    Allergies:  Allergies  Allergen Reactions  . Mold Extract [Trichophyton] Other (See Comments)    Wheezing   . Other     Roaches cause wheezing     Prescriptions prior to admission  Medication Sig Dispense Refill  . albuterol (PROVENTIL HFA;VENTOLIN HFA) 108 (90 BASE) MCG/ACT inhaler Inhale 2 puffs into the lungs every 4 (four) hours as needed.  For asthma  1 Inhaler  0  . amoxicillin (AMOXIL) 500 MG capsule Take 1 capsule (500 mg total) by mouth every 8 (eight) hours.  21 capsule  0  . budesonide-formoterol (SYMBICORT) 160-4.5 MCG/ACT inhaler Inhale 2 puffs into the lungs daily.      . cephALEXin (KEFLEX) 500 MG capsule Take 1 capsule (500 mg total) by mouth 2 (two) times daily.  14 capsule  0  . docusate sodium (COLACE) 100 MG capsule Take 1 capsule (100 mg total) by mouth 2 (two) times daily.  30 capsule  prn  . Doxylamine-Pyridoxine (DICLEGIS) 10-10 MG TBEC Take as directed:  2 tabs po q pm 1 tab po q am 1 tab po q pm  100 tablet  5  . levothyroxine (SYNTHROID) 175 MCG tablet Take 1 tablet (175 mcg total) by mouth daily before breakfast.  30 tablet  0   Results for orders placed during the hospital encounter of 04/30/14 (from the past 48 hour(s))  URINALYSIS, ROUTINE W REFLEX MICROSCOPIC     Status: Abnormal   Collection Time  04/30/14 11:10 PM      Result Value Ref Range   Color, Urine YELLOW  YELLOW   APPearance CLEAR  CLEAR   Specific Gravity, Urine 1.025  1.005 - 1.030   pH 6.0  5.0 - 8.0   Glucose, UA NEGATIVE  NEGATIVE mg/dL   Hgb urine dipstick TRACE (*) NEGATIVE   Bilirubin Urine SMALL (*) NEGATIVE   Ketones, ur 15 (*) NEGATIVE mg/dL   Protein, ur NEGATIVE  NEGATIVE mg/dL   Urobilinogen, UA 0.2  0.0 - 1.0 mg/dL   Nitrite NEGATIVE  NEGATIVE   Leukocytes, UA NEGATIVE  NEGATIVE  URINE MICROSCOPIC-ADD ON     Status: Abnormal   Collection Time    04/30/14 11:10 PM      Result Value Ref Range   Squamous Epithelial / LPF FEW (*) RARE   WBC, UA 0-2  <3 WBC/hpf   RBC / HPF 3-6  <3 RBC/hpf   Bacteria, UA FEW (*) RARE   Urine-Other MUCOUS PRESENT    WET PREP, GENITAL     Status: Abnormal   Collection Time    04/30/14 11:43 PM      Result Value Ref Range   Yeast Wet Prep HPF POC NONE SEEN  NONE SEEN   Trich, Wet Prep NONE SEEN  NONE SEEN   Clue Cells Wet Prep HPF POC FEW (*) NONE SEEN   WBC, Wet Prep HPF POC FEW  (*) NONE SEEN   Comment: MODERATE BACTERIA SEEN    Review of Systems  Constitutional: Negative for fever and chills.  Gastrointestinal: Positive for vomiting, abdominal pain and constipation (Patient had to force her BM, Last BM was 1 week ago. Hard stools, 2 balls of stool came out. ). Negative for nausea.  Genitourinary: Positive for urgency and frequency. Negative for hematuria and flank pain.   Physical Exam   Blood pressure 105/67, pulse 67, temperature 98.6 F (37 C), temperature source Oral, resp. rate 18, height  (1.778 m), weight 108.863 kg (240 lb), last menstrual period 12/07/2013.  Physical Exam  Constitutional: She is oriented to person, place, and time. She appears well-developed and well-nourished. No distress.  HENT:  Head: Normocephalic.  Respiratory: Effort normal.  GI: Soft. Normal appearance and bowel sounds are normal. She exhibits no distension. There is tenderness in the periumbilical area, suprapubic area, left upper quadrant and left lower quadrant. There is no rigidity, no rebound and no guarding.  Musculoskeletal: Normal range of motion.  Neurological: She is alert and oriented to person, place, and time.  Skin: Skin is warm. She is not diaphoretic.  Psychiatric: Her behavior is normal.    MAU Course  Procedures None  MDM +fht Wet prep Fleets enema  Patient had a small BM following fleets enema. Pt would like to go home and try miralax Urine culture pending  Assessment and Plan   A: Constipation in pregnancy UTI; culture pending  P: Discharge home in stable condition RX: Miralax, macrobid Ok to take colace as prescribed on the bottle Return to MAU with worsen symptoms including fever Follow up with OBGYN as scheduled  Increased fiber and increase PO fluids.   Iona Hansen Rasch, NP  04/30/2014, 11:29 PM

## 2014-04-30 NOTE — MAU Note (Signed)
Pt reports she started having a sharp pain in her left lower abd along with cramping. Pt denies andy vaginal bleeding bit does report a white creamy vaginal discharge.

## 2014-05-01 LAB — WET PREP, GENITAL
Trich, Wet Prep: NONE SEEN
YEAST WET PREP: NONE SEEN

## 2014-05-01 MED ORDER — POLYETHYLENE GLYCOL 3350 17 GM/SCOOP PO POWD: 1.0000 | Freq: Once | ORAL | Status: DC

## 2014-05-01 MED ORDER — NITROFURANTOIN MONOHYD MACRO 100 MG PO CAPS
100.0000 mg | ORAL_CAPSULE | Freq: Two times a day (BID) | ORAL | Status: DC
Start: 2014-05-01 — End: 2014-05-06

## 2014-05-01 MED ORDER — POLYETHYLENE GLYCOL 3350 17 GM/SCOOP PO POWD
1.0000 | Freq: Once | ORAL | Status: DC
Start: 2014-05-01 — End: 2014-06-17

## 2014-05-01 NOTE — Discharge Instructions (Signed)

## 2014-05-03 ENCOUNTER — Telehealth: Payer: Self-pay | Admitting: *Deleted

## 2014-05-03 NOTE — Telephone Encounter (Signed)
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 Patient to check back tomorrow for response.

## 2014-05-04 ENCOUNTER — Encounter: Payer: Self-pay | Admitting: *Deleted

## 2014-05-04 ENCOUNTER — Telehealth: Payer: Self-pay | Admitting: *Deleted

## 2014-05-04 NOTE — Telephone Encounter (Signed)
Patient called to check on her letter. Patient notified that Dr. Clearance Coots had okayed her letter and that we had it ready and waiting for her up front and that she could come by and pick it up at anytime. Patient voiced understanding.

## 2014-05-04 NOTE — Telephone Encounter (Signed)
OK for letter

## 2014-05-06 ENCOUNTER — Encounter: Payer: Self-pay | Admitting: Obstetrics

## 2014-05-06 ENCOUNTER — Ambulatory Visit (INDEPENDENT_AMBULATORY_CARE_PROVIDER_SITE_OTHER): Payer: Medicaid Other | Admitting: Obstetrics

## 2014-05-06 VITALS — BP 117/70 | HR 77 | Temp 98.0°F | Wt 235.0 lb

## 2014-05-06 DIAGNOSIS — Z3480 Encounter for supervision of other normal pregnancy, unspecified trimester: Secondary | ICD-10-CM

## 2014-05-06 DIAGNOSIS — Z3482 Encounter for supervision of other normal pregnancy, second trimester: Secondary | ICD-10-CM

## 2014-05-06 NOTE — Progress Notes (Signed)
Subjective:    Gwendolyn Clay is a 28 y.o. female being seen today for her obstetrical visit. She is at [redacted]w[redacted]d gestation. Patient reports: no complaints . Fetal movement: normal.  Problem List Items Addressed This Visit   None    Visit Diagnoses   Encounter for supervision of other normal pregnancy in second trimester    -  Primary    Relevant Orders       POCT urinalysis dipstick      Patient Active Problem List   Diagnosis Date Noted  . Nausea with vomiting 02/13/2014  . Back pain 08/02/2012  . Status asthmaticus 08/02/2012  . Allergic rhinitis   . Eczema   . Asthma   . Hypothyroidism   . Obese    Objective:    BP 117/70  Pulse 77  Temp(Src) 98 F (36.7 C)  Wt 235 lb (106.595 kg)  LMP 12/07/2013 FHT: 150 BPM  Uterine Size: size equals dates     Assessment:    Pregnancy @ [redacted]w[redacted]d    Plan:    OBGCT: ordered for next visit.  Labs, problem list reviewed and updated 2 hr GTT planned Follow up in 4 weeks.

## 2014-05-06 NOTE — Telephone Encounter (Signed)
Patient in office 05/06/2014

## 2014-05-07 ENCOUNTER — Other Ambulatory Visit: Payer: Self-pay | Admitting: Obstetrics

## 2014-05-13 LAB — POCT URINALYSIS DIPSTICK
Bilirubin, UA: NEGATIVE
Blood, UA: NEGATIVE
GLUCOSE UA: NEGATIVE
Ketones, UA: NEGATIVE
Leukocytes, UA: NEGATIVE
Nitrite, UA: NEGATIVE
PROTEIN UA: NEGATIVE
Spec Grav, UA: 1.02
UROBILINOGEN UA: NEGATIVE
pH, UA: 5

## 2014-05-23 ENCOUNTER — Inpatient Hospital Stay (HOSPITAL_COMMUNITY)
Admission: AD | Admit: 2014-05-23 | Discharge: 2014-05-24 | Disposition: A | Discharge status: Home / Self Care | Payer: Medicaid Other | Source: Ambulatory Visit | Attending: Obstetrics | Admitting: Obstetrics

## 2014-05-23 DIAGNOSIS — Z87891 Personal history of nicotine dependence: Secondary | ICD-10-CM | POA: Insufficient documentation

## 2014-05-23 DIAGNOSIS — A084 Viral intestinal infection, unspecified: Secondary | ICD-10-CM | POA: Insufficient documentation

## 2014-05-23 DIAGNOSIS — O9989 Other specified diseases and conditions complicating pregnancy, childbirth and the puerperium: Secondary | ICD-10-CM | POA: Diagnosis not present

## 2014-05-23 DIAGNOSIS — N949 Unspecified condition associated with female genital organs and menstrual cycle: Secondary | ICD-10-CM | POA: Diagnosis not present

## 2014-05-23 DIAGNOSIS — Z3A24 24 weeks gestation of pregnancy: Secondary | ICD-10-CM | POA: Insufficient documentation

## 2014-05-23 DIAGNOSIS — M545 Low back pain: Secondary | ICD-10-CM | POA: Diagnosis present

## 2014-05-23 NOTE — MAU Note (Signed)
Pt states that she began to vomit and have diarrhea 2 days ago. Today pt has had 1 episode of vomiting and diarrhea and is able to keep food and fluids down.

## 2014-05-24 ENCOUNTER — Encounter (HOSPITAL_COMMUNITY): Payer: Self-pay | Admitting: *Deleted

## 2014-05-24 DIAGNOSIS — A0839 Other viral enteritis: Secondary | ICD-10-CM

## 2014-05-24 DIAGNOSIS — Z3A24 24 weeks gestation of pregnancy: Secondary | ICD-10-CM

## 2014-05-24 DIAGNOSIS — O26892 Other specified pregnancy related conditions, second trimester: Secondary | ICD-10-CM

## 2014-05-24 LAB — URINE MICROSCOPIC-ADD ON

## 2014-05-24 LAB — URINALYSIS, ROUTINE W REFLEX MICROSCOPIC
Bilirubin Urine: NEGATIVE
Glucose, UA: NEGATIVE mg/dL
Hgb urine dipstick: NEGATIVE
KETONES UR: NEGATIVE mg/dL
Nitrite: NEGATIVE
Protein, ur: NEGATIVE mg/dL
Specific Gravity, Urine: 1.01 (ref 1.005–1.030)
UROBILINOGEN UA: 0.2 mg/dL (ref 0.0–1.0)
pH: 7 (ref 5.0–8.0)

## 2014-05-24 MED ORDER — ONDANSETRON 8 MG PO TBDP
8.0000 mg | ORAL_TABLET | Freq: Once | ORAL | Status: AC
  Administered 2014-05-24: 8 mg via ORAL
  Filled 2014-05-24: qty 1

## 2014-05-24 MED ORDER — ONDANSETRON HCL 4 MG PO TABS: 4.0000 mg | ORAL_TABLET | Freq: Four times a day (QID) | ORAL | Status: DC

## 2014-05-24 NOTE — MAU Provider Note (Signed)
History     CSN: [id number]  Arrival date and time: 05/23/14 2337   First Provider Initiated Contact with Patient 05/24/14 0016      Chief Complaint  Patient presents with  . Back Pain  . Fatigue   HPI Gwendolyn Clay is a 28 y.o. G2P1001 at 2262w0d who presents to MAU today with complaint of lower abdominal and low back pain x 1 week. The patient states that back pain is constant and abdominal pain comes and goes. She states that abdominal pain is worse with ambulation. She has also had N/V/D x 2-3 days. She took Phenergan today with some relief and did eat this evening. She does feel nauseous now, but has not had vomiting since eating tonight. She states last emesis and diarrhea were yesterday morning. She denies contractions, vaginal bleeding, discharge, LOF, fever, UTI symptoms or complications with this pregnancy or previous pregnancy. She reports good fetal movement.   OB History   Grav Para Term Preterm Abortions TAB SAB Ect Mult Living   2 1 1       1       Past Medical History  Diagnosis Date  . Hypothyroidism   . History of chicken pox   . Dysmenorrhea   . Obese   . Bacterial infection   . Trichomonas   . Gonorrhea   . Asthma   . Allergic rhinitis     adenoidectomy  . Eczema     Past Surgical History  Procedure Laterality Date  . Adenoidectomy    . Wisdom tooth extraction      Family History  Problem Relation Age of Onset  . Arthritis Maternal Aunt   . Diabetes Maternal Uncle   . Hypertension Maternal Grandmother   . Cancer Maternal Grandfather     PROSTATE  . Asthma      neices and nephews  . Allergic rhinitis Brother   . Eczema      neices and nephews  . Eczema Daughter     History  Substance Use Topics  . Smoking status: Former Smoker -- 0.20 packs/day    Types: Cigarettes    Quit date: 03/30/2014  . Smokeless tobacco: Never Used  . Alcohol Use: No     Comment: occasional    Allergies:  Allergies  Allergen Reactions  . Mold  Extract [Trichophyton] Other (See Comments)    Wheezing   . Other     Roaches cause wheezing     No prescriptions prior to admission    Review of Systems  Constitutional: Negative for fever and malaise/fatigue.  Gastrointestinal: Positive for nausea, vomiting, abdominal pain and diarrhea. Negative for constipation.  Genitourinary: Negative for dysuria, urgency and frequency.       Neg - vaginal bleeding, discharge, LOF   Physical Exam   Blood pressure 114/57, pulse 85, temperature 98.4 F (36.9 C), temperature source Oral, resp. rate 18, height 5\' 10"  (1.778 m), weight 253 lb 3.2 oz (114.851 kg), last menstrual period 12/07/2013, SpO2 98.00%.  Physical Exam  Constitutional: She is oriented to person, place, and time. She appears well-developed and well-nourished. No distress.  HENT:  Head: Normocephalic.  Cardiovascular: Normal rate.   Respiratory: Effort normal.  GI: Soft. She exhibits no distension and no mass. There is tenderness (mild). There is no rebound and no guarding.  Neurological: She is alert and oriented to person, place, and time.  Skin: Skin is warm and dry. No erythema.  Psychiatric: She has a normal mood and  affect.   Results for orders placed during the hospital encounter of 05/23/14 (from the past 24 hour(s))  URINALYSIS, ROUTINE W REFLEX MICROSCOPIC     Status: Abnormal   Collection Time    05/23/14 11:45 PM      Result Value Ref Range   Color, Urine YELLOW  YELLOW   APPearance CLEAR  CLEAR   Specific Gravity, Urine 1.010  1.005 - 1.030   pH 7.0  5.0 - 8.0   Glucose, UA NEGATIVE  NEGATIVE mg/dL   Hgb urine dipstick NEGATIVE  NEGATIVE   Bilirubin Urine NEGATIVE  NEGATIVE   Ketones, ur NEGATIVE  NEGATIVE mg/dL   Protein, ur NEGATIVE  NEGATIVE mg/dL   Urobilinogen, UA 0.2  0.0 - 1.0 mg/dL   Nitrite NEGATIVE  NEGATIVE   Leukocytes, UA SMALL (*) NEGATIVE  URINE MICROSCOPIC-ADD ON     Status: Abnormal   Collection Time    05/23/14 11:45 PM       Result Value Ref Range   Squamous Epithelial / LPF FEW (*) RARE   WBC, UA 0-2  <3 WBC/hpf   RBC / HPF 0-2  <3 RBC/hpf   Bacteria, UA RARE  RARE    Fetal Monitoring: Baseline:140 bpm, moderate variability, 10 x 10 accelerations, no decelerations Contractions: none  MAU Course  Procedures None  MDM UA today shows no signs of dehydration ODT Zofran given in MAU. Patient reports resolution of nausea, no episodes of emesis in MAU  Assessment and Plan  A: SIUP at 6258w0d Acute viral gastroenteritis Round ligament pain  P: Discharge home Rx for Zofran given to patient Patient advised to increased PO hydration and advance diet as tolerated Patient advised to follow-up with [facility] as scheduled or sooner if symptoms persist or worsen Patient may return to MAU as needed or if her condition were to change or worsen   Marny LowensteinJulie N Strummer Canipe, PA-C  05/24/2014, 1:58 AM

## 2014-05-24 NOTE — Discharge Instructions (Signed)
Food Choices to Help Relieve Diarrhea °When you have diarrhea, the foods you eat and your eating habits are very important. Choosing the right foods and drinks can help relieve diarrhea. Also, because diarrhea can last up to 7 days, you need to replace lost fluids and electrolytes (such as sodium, potassium, and chloride) in order to help prevent dehydration.  °WHAT GENERAL GUIDELINES DO I NEED TO FOLLOW? °· Slowly drink 1 cup (8 oz) of fluid for each episode of diarrhea. If you are getting enough fluid, your urine will be clear or pale yellow. °· Eat starchy foods. Some good choices include white rice, white toast, pasta, low-fiber cereal, baked potatoes (without the skin), saltine crackers, and bagels. °· Avoid large servings of any cooked vegetables. °· Limit fruit to two servings per day. A serving is ½ cup or 1 small piece. °· Choose foods with less than 2 g of fiber per serving. °· Limit fats to less than 8 tsp (38 g) per day. °· Avoid fried foods. °· Eat foods that have probiotics in them. Probiotics can be found in certain dairy products. °· Avoid foods and beverages that may increase the speed at which food moves through the stomach and intestines (gastrointestinal tract). Things to avoid include: °¨ High-fiber foods, such as dried fruit, raw fruits and vegetables, nuts, seeds, and whole grain foods. °¨ Spicy foods and high-fat foods. °¨ Foods and beverages sweetened with high-fructose corn syrup, honey, or sugar alcohols such as xylitol, sorbitol, and mannitol. °WHAT FOODS ARE RECOMMENDED? °Grains °White rice. White, French, or pita breads (fresh or toasted), including plain rolls, buns, or bagels. White pasta. Saltine, soda, or graham crackers. Pretzels. Low-fiber cereal. Cooked cereals made with water (such as cornmeal, farina, or cream cereals). Plain muffins. Matzo. Melba toast. Zwieback.  °Vegetables °Potatoes (without the skin). Strained tomato and vegetable juices. Most well-cooked and canned  vegetables without seeds. Tender lettuce. °Fruits °Cooked or canned applesauce, apricots, cherries, fruit cocktail, grapefruit, peaches, pears, or plums. Fresh bananas, apples without skin, cherries, grapes, cantaloupe, grapefruit, peaches, oranges, or plums.  °Meat and Other Protein Products °Baked or boiled chicken. Eggs. Tofu. Fish. Seafood. Smooth peanut butter. Ground or well-cooked tender beef, ham, veal, lamb, pork, or poultry.  °Dairy °Plain yogurt, kefir, and unsweetened liquid yogurt. Lactose-free milk, buttermilk, or soy milk. Plain hard cheese. °Beverages °Sport drinks. Clear broths. Diluted fruit juices (except prune). Regular, caffeine-free sodas such as ginger ale. Water. Decaffeinated teas. Oral rehydration solutions. Sugar-free beverages not sweetened with sugar alcohols. °Other °Bouillon, broth, or soups made from recommended foods.  °The items listed above may not be a complete list of recommended foods or beverages. Contact your dietitian for more options. °WHAT FOODS ARE NOT RECOMMENDED? °Grains °Whole grain, whole wheat, bran, or rye breads, rolls, pastas, crackers, and cereals. Wild or brown rice. Cereals that contain more than 2 g of fiber per serving. Corn tortillas or taco shells. Cooked or dry oatmeal. Granola. Popcorn. °Vegetables °Raw vegetables. Cabbage, broccoli, Brussels sprouts, artichokes, baked beans, beet greens, corn, kale, legumes, peas, sweet potatoes, and yams. Potato skins. Cooked spinach and cabbage. °Fruits °Dried fruit, including raisins and dates. Raw fruits. Stewed or dried prunes. Fresh apples with skin, apricots, mangoes, pears, raspberries, and strawberries.  °Meat and Other Protein Products °Chunky peanut butter. Nuts and seeds. Beans and lentils. Bacon.  °Dairy °High-fat cheeses. Milk, chocolate milk, and beverages made with milk, such as milk shakes. Cream. Ice cream. °Sweets and Desserts °Sweet rolls, doughnuts, and sweet breads. Pancakes   and waffles. Fats and  Oils Butter. Cream sauces. Margarine. Salad oils. Plain salad dressings. Olives. Avocados.  Beverages Caffeinated beverages (such as coffee, tea, soda, or energy drinks). Alcoholic beverages. Fruit juices with pulp. Prune juice. Soft drinks sweetened with high-fructose corn syrup or sugar alcohols. Other Coconut. Hot sauce. Chili powder. Mayonnaise. Gravy. Cream-based or milk-based soups.  The items listed above may not be a complete list of foods and beverages to avoid. Contact your dietitian for more information. WHAT SHOULD I DO IF I BECOME DEHYDRATED? Diarrhea can sometimes lead to dehydration. Signs of dehydration include dark urine and dry mouth and skin. If you think you are dehydrated, you should rehydrate with an oral rehydration solution. These solutions can be purchased at pharmacies, retail stores, or online.  Drink -1 cup (120-240 mL) of oral rehydration solution each time you have an episode of diarrhea. If drinking this amount makes your diarrhea worse, try drinking smaller amounts more often. For example, drink 1-3 tsp (5-15 mL) every 5-10 minutes.  A general rule for staying hydrated is to drink 1-2 L of fluid per day. Talk to your health care provider about the specific amount you should be drinking each day. Drink enough fluids to keep your urine clear or pale yellow. Document Released: 10/20/2003 Document Revised: 08/04/2013 Document Reviewed: 06/22/2013 [facility] Patient Information 2015 [place], [place]. This information is not intended to replace advice given to you by your health care provider. Make sure you discuss any questions you have with your health care provider. Abdominal Pain During Pregnancy Belly (abdominal) pain is common during pregnancy. Most of the time, it is not a serious problem. Other times, it can be a sign that something is wrong with the pregnancy. Always tell your doctor if you have belly pain. HOME CARE Monitor your belly pain for any changes. The  following actions may help you feel better:  Do not have sex (intercourse) or put anything in your vagina until you feel better.  Rest until your pain stops.  Drink clear fluids if you feel sick to your stomach (nauseous). Do not eat solid food until you feel better.  Only take medicine as told by your doctor.  Keep all doctor visits as told. GET HELP RIGHT AWAY IF:   You are bleeding, leaking fluid, or pieces of tissue come out of your vagina.  You have more pain or cramping.  You keep throwing up (vomiting).  You have pain when you pee (urinate) or have blood in your pee.  You have a fever.  You do not feel your baby moving as much.  You feel very weak or feel like passing out.  You have trouble breathing, with or without belly pain.  You have a very bad headache and belly pain.  You have fluid leaking from your vagina and belly pain.  You keep having watery poop (diarrhea).  Your belly pain does not go away after resting, or the pain gets worse. MAKE SURE YOU:   Understand these instructions.  Will watch your condition.  Will get help right away if you are not doing well or get worse. Document Released: 07/18/2009 Document Revised: 04/01/2013 Document Reviewed: 02/26/2013 [facility] Patient Information 2015 [place], [place]. This information is not intended to replace advice given to you by your health care provider. Make sure you discuss any questions you have with your health care provider.

## 2014-06-03 ENCOUNTER — Other Ambulatory Visit: Payer: Medicaid Other

## 2014-06-03 ENCOUNTER — Encounter: Payer: Medicaid Other | Admitting: Obstetrics

## 2014-06-10 ENCOUNTER — Ambulatory Visit (INDEPENDENT_AMBULATORY_CARE_PROVIDER_SITE_OTHER): Payer: Medicaid Other | Admitting: Obstetrics

## 2014-06-10 ENCOUNTER — Encounter: Payer: Self-pay | Admitting: Obstetrics

## 2014-06-10 ENCOUNTER — Other Ambulatory Visit: Payer: Medicaid Other

## 2014-06-10 VITALS — BP 105/67 | HR 78 | Temp 98.5°F | Wt 250.0 lb

## 2014-06-10 DIAGNOSIS — K219 Gastro-esophageal reflux disease without esophagitis: Secondary | ICD-10-CM

## 2014-06-10 DIAGNOSIS — Z3492 Encounter for supervision of normal pregnancy, unspecified, second trimester: Secondary | ICD-10-CM

## 2014-06-10 LAB — POCT URINALYSIS DIPSTICK
BILIRUBIN UA: NEGATIVE
Blood, UA: NEGATIVE
Glucose, UA: NEGATIVE
KETONES UA: NEGATIVE
Leukocytes, UA: NEGATIVE
NITRITE UA: NEGATIVE
PH UA: 6
Protein, UA: NEGATIVE
Spec Grav, UA: 1.015
Urobilinogen, UA: NEGATIVE

## 2014-06-10 MED ORDER — OMEPRAZOLE 20 MG PO CPDR: 20.0000 mg | DELAYED_RELEASE_CAPSULE | Freq: Two times a day (BID) | ORAL | Status: DC

## 2014-06-10 NOTE — Addendum Note (Signed)
Addended by: Marya LandryFOSTER, Greydis Stlouis D on: 06/10/2014 12:04 PM   Modules accepted: Orders

## 2014-06-10 NOTE — Progress Notes (Signed)
Subjective:    Gwendolyn Clay is a 28 y.o. female being seen today for her obstetrical visit. She is at 881w3d gestation. Patient reports: heartburn . Fetal movement: normal.  Problem List Items Addressed This Visit   None    Visit Diagnoses   Prenatal care, second trimester    -  Primary    Relevant Orders       POCT urinalysis dipstick      Patient Active Problem List   Diagnosis Date Noted  . Nausea with vomiting 02/13/2014  . Back pain 08/02/2012  . Status asthmaticus 08/02/2012  . Allergic rhinitis   . Eczema   . Asthma   . Hypothyroidism   . Obese    Objective:    BP 105/67  Pulse 78  Temp(Src) 98.5 F (36.9 C)  Wt 250 lb (113.399 kg)  LMP 12/07/2013 FHT: 150 BPM  Uterine Size: size equals dates     Assessment:    Pregnancy @ 4081w3d    Plan:    OBGCT: ordered.  Labs, problem list reviewed and updated 2 hr GTT planned Follow up in 2 weeks.

## 2014-06-10 NOTE — Progress Notes (Signed)
Patient states she has power pressure on pelvic bones.

## 2014-06-11 LAB — CBC
HCT: 32.2 % — ABNORMAL LOW (ref 36.0–46.0)
HEMOGLOBIN: 11.1 g/dL — AB (ref 12.0–15.0)
MCH: 29.4 pg (ref 26.0–34.0)
MCHC: 34.5 g/dL (ref 30.0–36.0)
MCV: 85.2 fL (ref 78.0–100.0)
Platelets: 266 10*3/uL (ref 150–400)
RBC: 3.78 MIL/uL — ABNORMAL LOW (ref 3.87–5.11)
RDW: 13.6 % (ref 11.5–15.5)
WBC: 5.7 10*3/uL (ref 4.0–10.5)

## 2014-06-11 LAB — GLUCOSE TOLERANCE, 2 HOURS W/ 1HR
GLUCOSE: 54 mg/dL — AB (ref 70–170)
Glucose, 2 hour: 42 mg/dL — CL (ref 70–139)
Glucose, Fasting: 61 mg/dL — ABNORMAL LOW (ref 70–99)

## 2014-06-11 LAB — RPR

## 2014-06-11 LAB — HIV ANTIBODY (ROUTINE TESTING W REFLEX): HIV: NONREACTIVE

## 2014-06-14 ENCOUNTER — Encounter: Payer: Self-pay | Admitting: Obstetrics

## 2014-06-15 ENCOUNTER — Encounter: Payer: Medicaid Other | Admitting: Obstetrics

## 2014-06-15 ENCOUNTER — Other Ambulatory Visit: Payer: Medicaid Other

## 2014-06-17 ENCOUNTER — Telehealth (HOSPITAL_COMMUNITY): Payer: Self-pay | Admitting: *Deleted

## 2014-06-17 ENCOUNTER — Encounter (HOSPITAL_COMMUNITY): Payer: Self-pay | Admitting: *Deleted

## 2014-06-17 ENCOUNTER — Inpatient Hospital Stay (HOSPITAL_COMMUNITY)
Admission: AD | Admit: 2014-06-17 | Discharge: 2014-06-17 | Disposition: A | Discharge status: Home / Self Care | Payer: Medicaid Other | Source: Ambulatory Visit | Attending: Obstetrics | Admitting: Obstetrics

## 2014-06-17 DIAGNOSIS — O99512 Diseases of the respiratory system complicating pregnancy, second trimester: Secondary | ICD-10-CM | POA: Insufficient documentation

## 2014-06-17 DIAGNOSIS — O99519 Diseases of the respiratory system complicating pregnancy, unspecified trimester: Secondary | ICD-10-CM

## 2014-06-17 DIAGNOSIS — O219 Vomiting of pregnancy, unspecified: Secondary | ICD-10-CM

## 2014-06-17 DIAGNOSIS — O3432 Maternal care for cervical incompetence, second trimester: Secondary | ICD-10-CM | POA: Diagnosis not present

## 2014-06-17 DIAGNOSIS — O212 Late vomiting of pregnancy: Secondary | ICD-10-CM | POA: Diagnosis not present

## 2014-06-17 DIAGNOSIS — Z87891 Personal history of nicotine dependence: Secondary | ICD-10-CM | POA: Insufficient documentation

## 2014-06-17 DIAGNOSIS — J45909 Unspecified asthma, uncomplicated: Secondary | ICD-10-CM | POA: Diagnosis not present

## 2014-06-17 DIAGNOSIS — Z3A27 27 weeks gestation of pregnancy: Secondary | ICD-10-CM | POA: Diagnosis not present

## 2014-06-17 DIAGNOSIS — O9989 Other specified diseases and conditions complicating pregnancy, childbirth and the puerperium: Secondary | ICD-10-CM

## 2014-06-17 DIAGNOSIS — J069 Acute upper respiratory infection, unspecified: Secondary | ICD-10-CM | POA: Diagnosis not present

## 2014-06-17 DIAGNOSIS — O42919 Preterm premature rupture of membranes, unspecified as to length of time between rupture and onset of labor, unspecified trimester: Secondary | ICD-10-CM | POA: Diagnosis not present

## 2014-06-17 DIAGNOSIS — R05 Cough: Secondary | ICD-10-CM | POA: Diagnosis present

## 2014-06-17 LAB — URINE MICROSCOPIC-ADD ON

## 2014-06-17 LAB — URINALYSIS, ROUTINE W REFLEX MICROSCOPIC
BILIRUBIN URINE: NEGATIVE
Glucose, UA: NEGATIVE mg/dL
Hgb urine dipstick: NEGATIVE
Ketones, ur: NEGATIVE mg/dL
Nitrite: NEGATIVE
Protein, ur: NEGATIVE mg/dL
SPECIFIC GRAVITY, URINE: 1.015 (ref 1.005–1.030)
UROBILINOGEN UA: 1 mg/dL (ref 0.0–1.0)
pH: 8 (ref 5.0–8.0)

## 2014-06-17 MED ORDER — ALBUTEROL SULFATE (2.5 MG/3ML) 0.083% IN NEBU
2.5000 mg | INHALATION_SOLUTION | Freq: Once | RESPIRATORY_TRACT | Status: AC
  Administered 2014-06-17: 2.5 mg via RESPIRATORY_TRACT
  Filled 2014-06-17: qty 3

## 2014-06-17 MED ORDER — ALBUTEROL SULFATE HFA 108 (90 BASE) MCG/ACT IN AERS: 2.0000 | INHALATION_SPRAY | RESPIRATORY_TRACT | Status: DC | PRN

## 2014-06-17 MED ORDER — MONTELUKAST SODIUM 10 MG PO TABS: 10.0000 mg | ORAL_TABLET | Freq: Every day | ORAL | Status: DC

## 2014-06-17 MED ORDER — PROMETHAZINE HCL 25 MG PO TABS: 25.0000 mg | ORAL_TABLET | Freq: Four times a day (QID) | ORAL | Status: DC | PRN

## 2014-06-17 MED ORDER — BUDESONIDE-FORMOTEROL FUMARATE 160-4.5 MCG/ACT IN AERO: 2.0000 | INHALATION_SPRAY | Freq: Every day | RESPIRATORY_TRACT | Status: DC

## 2014-06-17 MED ORDER — PROMETHAZINE HCL 25 MG RE SUPP: 25.0000 mg | Freq: Four times a day (QID) | RECTAL | Status: DC | PRN

## 2014-06-17 NOTE — MAU Note (Signed)
C/o cough since yesterday; N&V increased yesterday also but pt has had N&V with the pregnancy; hx of asthma and has not had her inhaler;

## 2014-06-17 NOTE — Discharge Instructions (Signed)

## 2014-06-17 NOTE — MAU Provider Note (Signed)
Chief Complaint:  Cough; Nausea; and Emesis   First Provider Initiated Contact with Patient 06/17/14 0844     HPI: Gwendolyn Clay is a 28 y.o. G2P1001 at 2088w3d who presents to maternity admissions reporting cough, congestion and exacerbation of asthma since yesterday. Using inhaler with inadequate relief. Has been on Symbicort and Singulair in the past, but ran out. Has not seen asthma doctor during this pregnancy.  Denies fever, chills, sore throat, contractions, leakage of fluid or vaginal bleeding. Good fetal movement.   Past Medical History: Past Medical History  Diagnosis Date  . Hypothyroidism   . History of chicken pox   . Dysmenorrhea   . Obese   . Bacterial infection   . Trichomonas   . Gonorrhea   . Asthma   . Allergic rhinitis     adenoidectomy  . Eczema     Past obstetric history: OB History  Gravida Para Term Preterm AB SAB TAB Ectopic Multiple Living  2 1 1       1     # Outcome Date GA Lbr Len/2nd Weight Sex Delivery Anes PTL Lv  2 Current           1 Term 03/09/09 7991w0d  2.608 kg (5 lb 12 oz) F Vag-Spont EPI  Y      Past Surgical History: Past Surgical History  Procedure Laterality Date  . Adenoidectomy    . Wisdom tooth extraction       Family History: Family History  Problem Relation Age of Onset  . Arthritis Maternal Aunt   . Diabetes Maternal Uncle   . Hypertension Maternal Grandmother   . Cancer Maternal Grandfather     PROSTATE  . Asthma      neices and nephews  . Allergic rhinitis Brother   . Eczema      neices and nephews  . Eczema Daughter     Social History: History  Substance Use Topics  . Smoking status: Former Smoker -- 0.20 packs/day    Types: Cigarettes    Quit date: 03/30/2014  . Smokeless tobacco: Never Used  . Alcohol Use: No     Comment: occasional    Allergies:  Allergies  Allergen Reactions  . Mold Extract [Trichophyton] Other (See Comments)    Wheezing   . Other     Roaches cause wheezing      Meds:  Prescriptions prior to admission  Medication Sig Dispense Refill Last Dose  . albuterol (PROVENTIL HFA;VENTOLIN HFA) 108 (90 BASE) MCG/ACT inhaler Inhale 2 puffs into the lungs every 4 (four) hours as needed. For asthma 1 Inhaler 0 Taking  . budesonide-formoterol (SYMBICORT) 160-4.5 MCG/ACT inhaler Inhale 2 puffs into the lungs daily.   Taking  . docusate sodium (COLACE) 100 MG capsule TAKE ONE CAPSULE BY MOUTH TWICE A DAY 30 capsule 0 Taking  . Doxylamine-Pyridoxine (DICLEGIS) 10-10 MG TBEC Take as directed:  2 tabs po q pm 1 tab po q am 1 tab po q pm 100 tablet 5 Taking  . levothyroxine (SYNTHROID) 175 MCG tablet Take 1 tablet (175 mcg total) by mouth daily before breakfast. 30 tablet 0 Taking  . omeprazole (PRILOSEC) 20 MG capsule Take 1 capsule (20 mg total) by mouth 2 (two) times daily before a meal. 60 capsule 5   . ondansetron (ZOFRAN) 4 MG tablet Take 1 tablet (4 mg total) by mouth every 6 (six) hours. 12 tablet 0 Not Taking  . polyethylene glycol powder (GLYCOLAX/MIRALAX) powder Take 255 g by mouth  once. 255 g 1 Not Taking  . promethazine (PHENERGAN) 25 MG tablet Take 25 mg by mouth every 6 (six) hours as needed for nausea or vomiting.   Not Taking    ROS: Pertinent findings in history of present illness. Negative for fever, chills, sore throat, contractions, leakage of fluid or vaginal bleeding.  Physical Exam  Blood pressure 105/67, pulse 84, temperature 98.6 F (37 C), temperature source Oral, resp. rate 16, height 5\' 10"  (1.778 m), weight 113.399 kg (250 lb), last menstrual period 12/07/2013. GENERAL: Well-developed, well-nourished female in mild distress. No cyanosis. Coughing frequently. HEENT: normocephalic. Nasal congestion and rhinorrhea present. Throat without erythema or exudate. HEART: normal rate RESP: normal effort. Lungs clear except for small amount of wheezing.  ABDOMEN: Soft, non-tender, gravid appropriate for gestational age EXTREMITIES: Nontender, no  edema NEURO: alert and oriented SPECULUM EXAM: deferred  FHT:  Baseline 145 , moderate variability, accelerations present, no decelerations Contractions: none   Labs: No results found for this or any previous visit (from the past 24 hour(s)).  Imaging:  No results found. MAU Course: Albuterol nebulizer given. Patient reports breathing feels much better. Decreased coughing.  Assessment: 1. Asthma complicating pregnancy, antepartum   2. URI (upper respiratory infection)   3. Nausea and vomiting of pregnancy, antepartum    Plan: Discharge home per consult with Dr. Clearance CootsHarper Labor precautions and fetal kick counts   Follow-up Information    Follow up with HARPER,CHARLES A, MD On 06/24/2014.   Specialty:  Obstetrics and Gynecology   Why:  As scheduled   Contact information:   [address] [place] [address] [phone]       Follow up with Asthma doctor.   Why:  Call today to discuss symptoms and plan of care. Need to resume care for asthma.         Medication List    ASK your doctor about these medications        albuterol 108 (90 BASE) MCG/ACT inhaler  Commonly known as:  PROVENTIL HFA;VENTOLIN HFA  Inhale 2 puffs into the lungs every 4 (four) hours as needed. For asthma     budesonide-formoterol 160-4.5 MCG/ACT inhaler  Commonly known as:  SYMBICORT  Inhale 2 puffs into the lungs daily.     docusate sodium 100 MG capsule  Commonly known as:  COLACE  TAKE ONE CAPSULE BY MOUTH TWICE A DAY     Doxylamine-Pyridoxine 10-10 MG Tbec  Commonly known as:  DICLEGIS  Take as directed:  2 tabs po q pm 1 tab po q am 1 tab po q pm     levothyroxine 175 MCG tablet  Commonly known as:  SYNTHROID  Take 1 tablet (175 mcg total) by mouth daily before breakfast.     omeprazole 20 MG capsule  Commonly known as:  PRILOSEC  Take 1 capsule (20 mg total) by mouth 2 (two) times daily before a meal.     ondansetron 4 MG tablet  Commonly known as:  ZOFRAN   Take 1 tablet (4 mg total) by mouth every 6 (six) hours.     polyethylene glycol powder powder  Commonly known as:  GLYCOLAX/MIRALAX  Take 255 g by mouth once.     promethazine 25 MG tablet  Commonly known as:  PHENERGAN  Take 25 mg by mouth every 6 (six) hours as needed for nausea or vomiting.        [place] Lanita Stammen, CNM 06/17/2014 8:29 AM

## 2014-06-24 ENCOUNTER — Ambulatory Visit (INDEPENDENT_AMBULATORY_CARE_PROVIDER_SITE_OTHER): Payer: Medicaid Other | Admitting: Obstetrics

## 2014-06-24 VITALS — BP 104/69 | HR 85 | Wt 250.0 lb

## 2014-06-24 DIAGNOSIS — Z3483 Encounter for supervision of other normal pregnancy, third trimester: Secondary | ICD-10-CM

## 2014-06-24 LAB — POCT URINALYSIS DIPSTICK
Bilirubin, UA: NEGATIVE
Blood, UA: NEGATIVE
Glucose, UA: NEGATIVE
Ketones, UA: NEGATIVE
Nitrite, UA: NEGATIVE
PROTEIN UA: NEGATIVE
Spec Grav, UA: 1.015
Urobilinogen, UA: NEGATIVE
pH, UA: 6.5

## 2014-06-25 ENCOUNTER — Encounter: Payer: Self-pay | Admitting: Obstetrics

## 2014-06-25 NOTE — Progress Notes (Signed)
Subjective:    Gwendolyn Clay is a 28 y.o. female being seen today for her obstetrical visit. She is at 2518w4d gestation. Patient reports no complaints. Fetal movement: normal.  Problem List Items Addressed This Visit    None    Visit Diagnoses    Encounter for supervision of other normal pregnancy in third trimester    -  Primary    Relevant Orders       POCT urinalysis dipstick (Completed)      Patient Active Problem List   Diagnosis Date Noted  . Nausea with vomiting 02/13/2014  . Back pain 08/02/2012  . Status asthmaticus 08/02/2012  . Allergic rhinitis   . Eczema   . Asthma   . Hypothyroidism   . Obese    Objective:    BP 104/69 mmHg  Pulse 85  Wt 250 lb (113.399 kg)  LMP 12/07/2013 FHT:  150 BPM  Uterine Size: size equals dates  Presentation: unsure     Assessment:    Pregnancy @ 7218w4d weeks   Plan:     labs reviewed, problem list updated Consent signed. GBS sent TDAP offered  Rhogam given for RH negative Pediatrician: discussed. Infant feeding: plans to breastfeed. Maternity leave: not discussed. Cigarette smoking: former smoker. Orders Placed This Encounter  Procedures  . POCT urinalysis dipstick   No orders of the defined types were placed in this encounter.   Follow up in 2 Weeks.

## 2014-07-12 ENCOUNTER — Telehealth: Payer: Self-pay | Admitting: *Deleted

## 2014-07-12 NOTE — Telephone Encounter (Signed)
Patient is calling wanting to know if she can have a TB skin test. 6:21 Call to patient - OK for TB skin test- she also request a form to be filled out for school. Patient to fax form over and we will  review it with the doctor.Patient [place] and will be neck 12/29.

## 2014-07-13 ENCOUNTER — Encounter: Payer: Medicaid Other | Admitting: Obstetrics

## 2014-07-28 ENCOUNTER — Telehealth: Payer: Self-pay

## 2014-07-28 DIAGNOSIS — IMO0001 Reserved for inherently not codable concepts without codable children: Secondary | ICD-10-CM | POA: Insufficient documentation

## 2014-07-28 DIAGNOSIS — Z3490 Encounter for supervision of normal pregnancy, unspecified, unspecified trimester: Secondary | ICD-10-CM | POA: Insufficient documentation

## 2014-07-28 DIAGNOSIS — N12 Tubulo-interstitial nephritis, not specified as acute or chronic: Secondary | ICD-10-CM | POA: Insufficient documentation

## 2014-08-13 NOTE — L&D Delivery Note (Signed)
Delivery Note At 3:46 PM a viable female was delivered via Vaginal, Spontaneous Delivery (Presentation: Right Occiput Anterior).  APGAR: 9, 9; weight  .   Placenta status: Intact, Spontaneous.  Cord: 3 vessels with the following complications: None.  Cord pH: none  Anesthesia: Epidural  Episiotomy: None Lacerations: 2nd degree Suture Repair: 2 - Chromic Est. Blood Loss (mL): 350  Mom to postpartum.  Baby to Couplet care / Skin to Skin.  HARPER,CHARLES A 09/08/2014, 4:08 PM

## 2014-08-16 ENCOUNTER — Ambulatory Visit (INDEPENDENT_AMBULATORY_CARE_PROVIDER_SITE_OTHER): Payer: Medicaid Other | Admitting: Obstetrics

## 2014-08-16 VITALS — BP 97/69 | HR 84 | Temp 97.2°F | Wt 262.0 lb

## 2014-08-16 DIAGNOSIS — Z3483 Encounter for supervision of other normal pregnancy, third trimester: Secondary | ICD-10-CM

## 2014-08-16 DIAGNOSIS — O2243 Hemorrhoids in pregnancy, third trimester: Secondary | ICD-10-CM

## 2014-08-16 LAB — POCT URINALYSIS DIPSTICK
Bilirubin, UA: NEGATIVE
Glucose, UA: NEGATIVE
Ketones, UA: NEGATIVE
Leukocytes, UA: NEGATIVE
Nitrite, UA: NEGATIVE
Protein, UA: NEGATIVE
RBC UA: NEGATIVE
Spec Grav, UA: <=1.005
UROBILINOGEN UA: NEGATIVE
pH, UA: >=9

## 2014-08-16 MED ORDER — HYDROCORTISONE ACETATE 25 MG RE SUPP: 25.0000 mg | Freq: Two times a day (BID) | RECTAL | Status: DC

## 2014-08-17 ENCOUNTER — Encounter: Payer: Self-pay | Admitting: Obstetrics

## 2014-08-17 LAB — STREP B DNA PROBE: GBSP: DETECTED

## 2014-08-17 NOTE — Progress Notes (Signed)
Subjective:    Gwendolyn Clay is a 29 y.o. female being seen today for her obstetrical visit. She is at 2167w1d gestation. Patient reports no complaints. Fetal movement: normal.  Problem List Items Addressed This Visit    None    Visit Diagnoses    Encounter for supervision of other normal pregnancy in third trimester    -  Primary    Relevant Orders       POCT urinalysis dipstick (Completed)       Strep B DNA probe    Hemorrhoids during pregnancy, antepartum, third trimester        Relevant Medications       hydrocortisone (ANUSOL-HC) 25 MG suppository      Patient Active Problem List   Diagnosis Date Noted  . Nausea with vomiting 02/13/2014  . Back pain 08/02/2012  . Status asthmaticus 08/02/2012  . Allergic rhinitis   . Eczema   . Asthma   . Hypothyroidism   . Obese    Objective:    BP 97/69 mmHg  Pulse 84  Temp(Src) 97.2 F (36.2 C)  Wt 262 lb (118.842 kg)  LMP 12/07/2013 FHT:  150 BPM  Uterine Size: size equals dates  Presentation: unsure     Assessment:    Pregnancy @ 5667w1d weeks   Plan:     labs reviewed, problem list updated Consent signed. GBS sent TDAP offered  Rhogam given for RH negative Pediatrician: discussed. Infant feeding: plans to breastfeed. Maternity leave: discussed. Cigarette smoking: quit 8 / 2015. Orders Placed This Encounter  Procedures  . Strep B DNA probe  . POCT urinalysis dipstick   Meds ordered this encounter  Medications  . omeprazole (PRILOSEC) 40 MG capsule    Sig: Take 40 mg by mouth daily.  Marland Kitchen. DISCONTD: hydrocortisone (ANUSOL-HC) 25 MG suppository    Sig: Place 1 suppository (25 mg total) rectally 2 (two) times daily.    Dispense:  24 suppository    Refill:  0  . hydrocortisone (ANUSOL-HC) 25 MG suppository    Sig: Place 1 suppository (25 mg total) rectally 2 (two) times daily.    Dispense:  24 suppository    Refill:  prn   Follow up in 1 Week.

## 2014-08-24 ENCOUNTER — Encounter: Payer: Medicaid Other | Admitting: Obstetrics

## 2014-08-25 ENCOUNTER — Ambulatory Visit (INDEPENDENT_AMBULATORY_CARE_PROVIDER_SITE_OTHER): Payer: Medicaid Other | Admitting: Obstetrics

## 2014-08-25 VITALS — BP 99/69 | HR 104 | Wt 262.0 lb

## 2014-08-25 DIAGNOSIS — K219 Gastro-esophageal reflux disease without esophagitis: Secondary | ICD-10-CM

## 2014-08-25 DIAGNOSIS — O219 Vomiting of pregnancy, unspecified: Secondary | ICD-10-CM

## 2014-08-25 DIAGNOSIS — Z3483 Encounter for supervision of other normal pregnancy, third trimester: Secondary | ICD-10-CM

## 2014-08-25 LAB — POCT URINALYSIS DIPSTICK
BILIRUBIN UA: NEGATIVE
Blood, UA: NEGATIVE
GLUCOSE UA: NEGATIVE
Ketones, UA: NEGATIVE
LEUKOCYTES UA: NEGATIVE
NITRITE UA: NEGATIVE
PROTEIN UA: NEGATIVE
Spec Grav, UA: 1.01
Urobilinogen, UA: NEGATIVE
pH, UA: 6

## 2014-08-25 MED ORDER — ONDANSETRON HCL 8 MG PO TABS: 8.0000 mg | ORAL_TABLET | Freq: Three times a day (TID) | ORAL | Status: DC | PRN

## 2014-08-25 MED ORDER — OMEPRAZOLE 20 MG PO CPDR: 20.0000 mg | DELAYED_RELEASE_CAPSULE | Freq: Two times a day (BID) | ORAL | Status: DC

## 2014-08-31 ENCOUNTER — Encounter: Payer: Self-pay | Admitting: Obstetrics

## 2014-08-31 NOTE — Progress Notes (Signed)
Subjective:    Gwendolyn Clay is a 29 y.o. female being seen today for her obstetrical visit. She is at 2470w1d gestation. Patient reports no complaints. Fetal movement: normal.  Problem List Items Addressed This Visit    None    Visit Diagnoses    Encounter for supervision of other normal pregnancy in third trimester    -  Primary    Relevant Orders    POCT urinalysis dipstick (Completed)    Nausea and vomiting during pregnancy        Relevant Medications    ondansetron (ZOFRAN) tablet    GERD without esophagitis        Relevant Medications    ondansetron (ZOFRAN) tablet    omeprazole (PRILOSEC) capsule    SGA (small for gestational age)        Relevant Orders    US OB Comp + 14 Wk      Patient Active Problem List   Diagnosis Date Noted  . Nausea with vomiting 02/13/2014  . Back pain 08/02/2012  . Status asthmaticus 08/02/2012  . Allergic rhinitis   . Eczema   . Asthma   . Hypothyroidism   . Obese     Objective:    BP 99/69 mmHg  Pulse 104  Wt 262 lb (118.842 kg)  LMP 12/07/2013 FHT: 150 BPM  Uterine Size: Size < Dates  Presentations: unsure  Pelvic Exam: Deferred    Assessment:    Pregnancy @ 6070w1d weeks   Plan:   Plans for delivery: Vaginal anticipated; labs reviewed; problem list updated Counseling: Consent signed. Infant feeding: plans to breastfeed. Cigarette smoking: quit 2015. L&D discussion: symptoms of labor, discussed when to call, discussed what number to call, anesthetic/analgesic options reviewed and delivering clinician:  plans Physician. Postpartum supports and preparation: circumcision discussed and contraception plans discussed.  Follow up in 1 Week.

## 2014-09-01 ENCOUNTER — Ambulatory Visit (INDEPENDENT_AMBULATORY_CARE_PROVIDER_SITE_OTHER): Payer: Medicaid Other | Admitting: Obstetrics

## 2014-09-01 ENCOUNTER — Ambulatory Visit (INDEPENDENT_AMBULATORY_CARE_PROVIDER_SITE_OTHER): Payer: Medicaid Other

## 2014-09-01 ENCOUNTER — Other Ambulatory Visit: Payer: Self-pay | Admitting: Obstetrics

## 2014-09-01 ENCOUNTER — Encounter: Payer: Self-pay | Admitting: Obstetrics

## 2014-09-01 VITALS — BP 103/72 | HR 99 | Temp 97.5°F | Wt 264.0 lb

## 2014-09-01 DIAGNOSIS — Z3689 Encounter for other specified antenatal screening: Secondary | ICD-10-CM

## 2014-09-01 DIAGNOSIS — O365933 Maternal care for other known or suspected poor fetal growth, third trimester, fetus 3: Secondary | ICD-10-CM

## 2014-09-01 DIAGNOSIS — Z36 Encounter for antenatal screening of mother: Secondary | ICD-10-CM

## 2014-09-01 DIAGNOSIS — O2243 Hemorrhoids in pregnancy, third trimester: Secondary | ICD-10-CM

## 2014-09-01 DIAGNOSIS — Z3483 Encounter for supervision of other normal pregnancy, third trimester: Secondary | ICD-10-CM

## 2014-09-01 LAB — POCT URINALYSIS DIPSTICK
BILIRUBIN UA: NEGATIVE
GLUCOSE UA: NEGATIVE
Ketones, UA: NEGATIVE
NITRITE UA: NEGATIVE
PH UA: 6.5
Spec Grav, UA: 1.01
UROBILINOGEN UA: NEGATIVE

## 2014-09-01 LAB — US OB FOLLOW UP

## 2014-09-01 MED ORDER — HYDROCORTISONE 2.5 % EX CREA: TOPICAL_CREAM | Freq: Two times a day (BID) | CUTANEOUS | Status: DC

## 2014-09-01 NOTE — Progress Notes (Addendum)
Subjective:    Gwendolyn Clay is a 29 y.o. female being seen today for her obstetrical visit. She is at 5339w2d gestation. Patient reports no complaints. Fetal movement: normal.  Problem List Items Addressed This Visit    None    Visit Diagnoses    Encounter for supervision of other normal pregnancy in third trimester    -  Primary    Relevant Medications    hydrocortisone 2.5 % cream    Other Relevant Orders    POCT urinalysis dipstick (Completed)    Hemorrhoids in pregnancy, third trimester        Relevant Medications    hydrocortisone 2.5 % cream      Patient Active Problem List   Diagnosis Date Noted  . Nausea with vomiting 02/13/2014  . Back pain 08/02/2012  . Status asthmaticus 08/02/2012  . Allergic rhinitis   . Eczema   . Asthma   . Hypothyroidism   . Obese     Objective:    BP 103/72 mmHg  Pulse 99  Temp(Src) 97.5 F (36.4 C)  Wt 264 lb (119.75 kg)  LMP 12/07/2013 FHT: 150 BPM  Uterine Size: size equals dates  Presentations: unsure  Pelvic Exam: Deferred    Assessment:    Pregnancy @ 5539w2d weeks    IUGR on ultrasound today.  Plan:   Plans for delivery: Vaginal anticipated;  Will plan IOL at 39 weeks due to IUGR per recommendation of MFM; labs reviewed; problem list updated Counseling: Consent signed. Infant feeding: plans to breastfeed. Cigarette smoking: quit 8 / 2015. L&D discussion: symptoms of labor, discussed when to call, discussed what number to call, anesthetic/analgesic options reviewed and delivering clinician:  plans Physician. Postpartum supports and preparation: circumcision discussed and contraception plans discussed.  Follow up in 1 Week.

## 2014-09-01 NOTE — Addendum Note (Signed)
Addended by: Marya LandryFOSTER, Zayed Griffie D on: 09/01/2014 01:40 PM   Modules accepted: Orders

## 2014-09-02 ENCOUNTER — Telehealth (HOSPITAL_COMMUNITY): Payer: Self-pay | Admitting: *Deleted

## 2014-09-02 ENCOUNTER — Encounter (HOSPITAL_COMMUNITY): Payer: Self-pay | Admitting: *Deleted

## 2014-09-02 NOTE — Telephone Encounter (Signed)
Preadmission screen  

## 2014-09-03 LAB — CULTURE, OB URINE: Colony Count: >=100000

## 2014-09-05 ENCOUNTER — Inpatient Hospital Stay (HOSPITAL_COMMUNITY)
Admission: AD | Admit: 2014-09-05 | Discharge: 2014-09-05 | Disposition: A | Discharge status: Home / Self Care | Payer: Medicaid Other | Source: Ambulatory Visit | Attending: Obstetrics | Admitting: Obstetrics

## 2014-09-05 ENCOUNTER — Encounter (HOSPITAL_COMMUNITY): Payer: Self-pay | Admitting: *Deleted

## 2014-09-05 DIAGNOSIS — O471 False labor at or after 37 completed weeks of gestation: Secondary | ICD-10-CM | POA: Diagnosis present

## 2014-09-05 DIAGNOSIS — Z3A38 38 weeks gestation of pregnancy: Secondary | ICD-10-CM | POA: Diagnosis not present

## 2014-09-05 DIAGNOSIS — O9989 Other specified diseases and conditions complicating pregnancy, childbirth and the puerperium: Secondary | ICD-10-CM | POA: Diagnosis not present

## 2014-09-05 DIAGNOSIS — Z87891 Personal history of nicotine dependence: Secondary | ICD-10-CM | POA: Insufficient documentation

## 2014-09-05 DIAGNOSIS — K529 Noninfective gastroenteritis and colitis, unspecified: Secondary | ICD-10-CM | POA: Diagnosis not present

## 2014-09-05 LAB — URINALYSIS, ROUTINE W REFLEX MICROSCOPIC
BILIRUBIN URINE: NEGATIVE
GLUCOSE, UA: NEGATIVE mg/dL
Hgb urine dipstick: NEGATIVE
Ketones, ur: 15 mg/dL — AB
Leukocytes, UA: NEGATIVE
NITRITE: NEGATIVE
Protein, ur: NEGATIVE mg/dL
Specific Gravity, Urine: 1.01 (ref 1.005–1.030)
UROBILINOGEN UA: 2 mg/dL — AB (ref 0.0–1.0)
pH: 6.5 (ref 5.0–8.0)

## 2014-09-05 MED ORDER — LACTATED RINGERS IV BOLUS (SEPSIS)
1000.0000 mL | Freq: Once | INTRAVENOUS | Status: AC
  Administered 2014-09-05: 1000 mL via INTRAVENOUS

## 2014-09-05 MED ORDER — ONDANSETRON 8 MG/NS 50 ML IVPB
8.0000 mg | Freq: Once | INTRAVENOUS | Status: AC
  Administered 2014-09-05: 8 mg via INTRAVENOUS
  Filled 2014-09-05: qty 8

## 2014-09-05 MED ORDER — PROMETHAZINE HCL 25 MG/ML IJ SOLN
25.0000 mg | Freq: Once | INTRAMUSCULAR | Status: AC
  Administered 2014-09-05: 25 mg via INTRAVENOUS
  Filled 2014-09-05: qty 1

## 2014-09-05 MED ORDER — ONDANSETRON 8 MG PO TBDP: 8.0000 mg | ORAL_TABLET | Freq: Three times a day (TID) | ORAL | Status: DC | PRN

## 2014-09-05 NOTE — MAU Provider Note (Signed)
Chief Complaint:  Contractions  First Provider Initiated Contact with Patient 09/05/14 1047      HPI: Gwendolyn Clay is a 29 y.o. G2P1001 at 3212w6d who presents to maternity admissions reporting contractions, vomiting and one episode of diarrhea since last night.  Denies fever, chills, leakage of fluid or vaginal bleeding. Good fetal movement.   Past Medical History: Past Medical History  Diagnosis Date  . Hypothyroidism   . History of chicken pox   . Dysmenorrhea   . Obese   . Bacterial infection   . Trichomonas   . Gonorrhea   . Asthma   . Allergic rhinitis     adenoidectomy  . Eczema     Past obstetric history: OB History  Gravida Para Term Preterm AB SAB TAB Ectopic Multiple Living  2 1 1       1     # Outcome Date GA Lbr Len/2nd Weight Sex Delivery Anes PTL Lv  2 Current           1 Term 03/09/09 6736w0d  2.608 kg (5 lb 12 oz) F Vag-Spont EPI  Y      Past Surgical History: Past Surgical History  Procedure Laterality Date  . Adenoidectomy    . Wisdom tooth extraction       Family History: Family History  Problem Relation Age of Onset  . Arthritis Maternal Aunt   . Diabetes Maternal Uncle   . Hypertension Maternal Grandmother   . Cancer Maternal Grandfather     PROSTATE  . Asthma      neices and nephews  . Allergic rhinitis Brother   . Eczema      neices and nephews  . Eczema Daughter   . Diabetes Father     Social History: History  Substance Use Topics  . Smoking status: Former Smoker -- 0.20 packs/day    Types: Cigarettes    Quit date: 03/30/2014  . Smokeless tobacco: Never Used  . Alcohol Use: No     Comment: occasional    Allergies:  Allergies  Allergen Reactions  . Mold Extract [Trichophyton] Other (See Comments)    Wheezing   . Other     Roaches cause wheezing     Meds:  Prescriptions prior to admission  Medication Sig Dispense Refill Last Dose  . albuterol (PROVENTIL HFA;VENTOLIN HFA) 108 (90 BASE) MCG/ACT inhaler Inhale 2  puffs into the lungs every 4 (four) hours as needed. For asthma 1 Inhaler 0 Past Week at Unknown time  . budesonide-formoterol (SYMBICORT) 160-4.5 MCG/ACT inhaler Inhale 2 puffs into the lungs daily. 1 Inhaler 12 Past Week at Unknown time  . hydrocortisone 2.5 % cream Apply topically 2 (two) times daily. 28 g 0 Past Month at Unknown time  . levothyroxine (SYNTHROID) 175 MCG tablet Take 1 tablet (175 mcg total) by mouth daily before breakfast. 30 tablet 0 09/04/2014 at Unknown time  . omeprazole (PRILOSEC) 20 MG capsule Take 1 capsule (20 mg total) by mouth 2 (two) times daily before a meal. 60 capsule 5 09/04/2014 at 2200  . Prenatal Vit-Fe Fumarate-FA (PRENATAL MULTIVITAMIN) TABS tablet Take 1 tablet by mouth daily at 12 noon.   09/04/2014 at 2200  . hydrocortisone (ANUSOL-HC) 25 MG suppository Place 1 suppository (25 mg total) rectally 2 (two) times daily. (Patient not taking: Reported on 09/01/2014) 24 suppository prn Not Taking at Unknown time  . montelukast (SINGULAIR) 10 MG tablet Take 1 tablet (10 mg total) by mouth at bedtime. (Patient not taking: Reported  on 08/16/2014) 30 tablet 6 Not Taking at Unknown time  . ondansetron (ZOFRAN) 8 MG tablet Take 1 tablet (8 mg total) by mouth every 8 (eight) hours as needed for nausea or vomiting. (Patient not taking: Reported on 09/01/2014) 60 tablet 2 Not Taking    ROS:  Review of Systems  Constitutional: Negative for fever and chills.  Gastrointestinal: Positive for nausea, vomiting, abdominal pain and diarrhea.    Physical Exam  Blood pressure 106/54, pulse 86, temperature 98.5 F (36.9 C), temperature source Oral, resp. rate 18, last menstrual period 12/07/2013. GENERAL: Well-developed, well-nourished female in mild distress.  HEENT: normocephalic HEART: normal rate RESP: normal effort ABDOMEN: Soft, non-tender, gravid appropriate for gestational age EXTREMITIES: Nontender, no edema NEURO: alert and oriented SPECULUM EXAM: Deferred Dilation:  1 Effacement (%): 50 Cervical Position: Posterior Station: -3 Presentation: Vertex Exam by:: AMariel Sleet, RN  FHT:  Baseline 125 , moderate variability, accelerations present, no decelerations. Tracing maternal heart rate after reactive NST. Give him reapplied. Reactive NST. Contractions: iregg , mild   Labs: Results for orders placed or performed during the hospital encounter of 09/05/14 (from the past 24 hour(s))  Urinalysis, Routine w reflex microscopic     Status: Abnormal   Collection Time: 09/05/14 10:40 AM  Result Value Ref Range   Color, Urine YELLOW YELLOW   APPearance CLEAR CLEAR   Specific Gravity, Urine 1.010 1.005 - 1.030   pH 6.5 5.0 - 8.0   Glucose, UA NEGATIVE NEGATIVE mg/dL   Hgb urine dipstick NEGATIVE NEGATIVE   Bilirubin Urine NEGATIVE NEGATIVE   Ketones, ur 15 (A) NEGATIVE mg/dL   Protein, ur NEGATIVE NEGATIVE mg/dL   Urobilinogen, UA 2.0 (H) 0.0 - 1.0 mg/dL   Nitrite NEGATIVE NEGATIVE   Leukocytes, UA NEGATIVE NEGATIVE    Imaging:  No results found. MAU Course: Phenergan and IV bolus given. Patient was feeling better but started having nausea again when it was time to be discharged. Zofran given.  Feeling better.  Assessment:   ICD-9-CM ICD-10-CM   1. False labor at or after 37 completed weeks of gestation, antepartum 644.13 O47.1 lactated ringers bolus 1,000 mL     promethazine (PHENERGAN) injection 25 mg     Urinalysis, Routine w reflex microscopic     Discharge patient     lactated ringers bolus 1,000 mL     ondansetron (ZOFRAN) 8 mg/NS 50 ml IVPB     ondansetron (ZOFRAN ODT) 8 MG disintegrating tablet  2. Acute gastroenteritis 558.9 K52.9 lactated ringers bolus 1,000 mL     promethazine (PHENERGAN) injection 25 mg     Urinalysis, Routine w reflex microscopic     Discharge patient     lactated ringers bolus 1,000 mL     ondansetron (ZOFRAN) 8 mg/NS 50 ml IVPB     ondansetron (ZOFRAN ODT) 8 MG disintegrating tablet    Plan: Discharge  home in stable condition. Clear liquids today, then advance diet slowly. Labor precautions and fetal kick counts     Follow-up Information    Follow up with [facility].   Specialty:  Obstetrics and Gynecology   Why:  As scheduled or as needed if symptoms worsen   Contact information:   [address], [address] [place] [address] [phone]      Follow up with [facility] [place] [facility]ERN[facility].   Why:  As needed in emergencies   Contact information:   [address] [id number] [place]  [address] [phone]       Medication List    STOP taking these medications        hydrocortisone 25 MG suppository  Commonly known as:  ANUSOL-HC     montelukast 10 MG tablet  Commonly known as:  SINGULAIR     ondansetron 8 MG tablet  Commonly known as:  ZOFRAN      TAKE these medications        albuterol 108 (90 BASE) MCG/ACT inhaler  Commonly known as:  PROVENTIL HFA;VENTOLIN HFA  Inhale 2 puffs into the lungs every 4 (four) hours as needed. For asthma     budesonide-formoterol 160-4.5 MCG/ACT inhaler  Commonly known as:  SYMBICORT  Inhale 2 puffs into the lungs daily.     hydrocortisone 2.5 % cream  Apply topically 2 (two) times daily.     levothyroxine 175 MCG tablet  Commonly known as:  SYNTHROID  Take 1 tablet (175 mcg total) by mouth daily before breakfast.     omeprazole 20 MG capsule  Commonly known as:  PRILOSEC  Take 1 capsule (20 mg total) by mouth 2 (two) times daily before a meal.     ondansetron 8 MG disintegrating tablet  Commonly known as:  ZOFRAN ODT  Take 1 tablet (8 mg total) by mouth every 8 (eight) hours as needed for nausea or vomiting.     prenatal multivitamin Tabs tablet  Take 1 tablet by mouth daily at 12 noon.        [place], CNM 09/05/2014 12:06 PM

## 2014-09-05 NOTE — Discharge Instructions (Signed)
Viral Gastroenteritis °Viral gastroenteritis is also known as stomach flu. This condition affects the stomach and intestinal tract. It can cause sudden diarrhea and vomiting. The illness typically lasts 3 to 8 days. Most people develop an immune response that eventually gets rid of the virus. While this natural response develops, the virus can make you quite ill. °CAUSES  °Many different viruses can cause gastroenteritis, such as rotavirus or noroviruses. You can catch one of these viruses by consuming contaminated food or water. You may also catch a virus by sharing utensils or other personal items with an infected person or by touching a contaminated surface. °SYMPTOMS  °The most common symptoms are diarrhea and vomiting. These problems can cause a severe loss of body fluids (dehydration) and a body salt (electrolyte) imbalance. Other symptoms may include: °· Fever. °· Headache. °· Fatigue. °· Abdominal pain. °DIAGNOSIS  °Your caregiver can usually diagnose viral gastroenteritis based on your symptoms and a physical exam. A stool sample may also be taken to test for the presence of viruses or other infections. °TREATMENT  °This illness typically goes away on its own. Treatments are aimed at rehydration. The most serious cases of viral gastroenteritis involve vomiting so severely that you are not able to keep fluids down. In these cases, fluids must be given through an intravenous line (IV). °HOME CARE INSTRUCTIONS  °· Drink enough fluids to keep your urine clear or pale yellow. Drink small amounts of fluids frequently and increase the amounts as tolerated. °· Ask your caregiver for specific rehydration instructions. °· Avoid: °· Foods high in sugar. °· Alcohol. °· Carbonated drinks. °· Tobacco. °· Juice. °· Caffeine drinks. °· Extremely hot or cold fluids. °· Fatty, greasy foods. °· Too much intake of anything at one time. °· Dairy products until 24 to 48 hours after diarrhea stops. °· You may consume probiotics.  Probiotics are active cultures of beneficial bacteria. They may lessen the amount and number of diarrheal stools in adults. Probiotics can be found in yogurt with active cultures and in supplements. °· Wash your hands well to avoid spreading the virus. °· Only take over-the-counter or prescription medicines for pain, discomfort, or fever as directed by your caregiver. Do not give aspirin to children. Antidiarrheal medicines are not recommended. °· Ask your caregiver if you should continue to take your regular prescribed and over-the-counter medicines. °· Keep all follow-up appointments as directed by your caregiver. °SEEK IMMEDIATE MEDICAL CARE IF:  °· You are unable to keep fluids down. °· You do not urinate at least once every 6 to 8 hours. °· You develop shortness of breath. °· You notice blood in your stool or vomit. This may look like coffee grounds. °· You have abdominal pain that increases or is concentrated in one small area (localized). °· You have persistent vomiting or diarrhea. °· You have a fever. °· The patient is a child younger than 3 months, and he or she has a fever. °· The patient is a child older than 3 months, and he or she has a fever and persistent symptoms. °· The patient is a child older than 3 months, and he or she has a fever and symptoms suddenly get worse. °· The patient is a baby, and he or she has no tears when crying. °MAKE SURE YOU:  °· Understand these instructions. °· Will watch your condition. °· Will get help right away if you are not doing well or get worse. °Document Released: 07/30/2005 Document Revised: 10/22/2011 Document Reviewed: 05/16/2011 °  ExitCare Patient Information 2015 [place], [place]. This information is not intended to replace advice given to you by your health care provider. Make sure you discuss any questions you have with your health care provider.  Braxton Hicks Contractions Contractions of the uterus can occur throughout pregnancy. Contractions are not  always a sign that you are in labor.  WHAT ARE BRAXTON HICKS CONTRACTIONS?  Contractions that occur before labor are called Braxton Hicks contractions, or false labor. Toward the end of pregnancy (32-34 weeks), these contractions can develop more often and may become more forceful. This is not true labor because these contractions do not result in opening (dilatation) and thinning of the cervix. They are sometimes difficult to tell apart from true labor because these contractions can be forceful and people have different pain tolerances. You should not feel embarrassed if you go to the hospital with false labor. Sometimes, the only way to tell if you are in true labor is for your health care provider to look for changes in the cervix. If there are no prenatal problems or other health problems associated with the pregnancy, it is completely safe to be sent home with false labor and await the onset of true labor. HOW CAN YOU TELL THE DIFFERENCE BETWEEN TRUE AND FALSE LABOR? False Labor  The contractions of false labor are usually shorter and not as hard as those of true labor.   The contractions are usually irregular.   The contractions are often felt in the front of the lower abdomen and in the groin.   The contractions may go away when you walk around or change positions while lying down.   The contractions get weaker and are shorter lasting as time goes on.   The contractions do not usually become progressively stronger, regular, and closer together as with true labor.  True Labor  Contractions in true labor last 30-70 seconds, become very regular, usually become more intense, and increase in frequency.   The contractions do not go away with walking.   The discomfort is usually felt in the top of the uterus and spreads to the lower abdomen and low back.   True labor can be determined by your health care provider with an exam. This will show that the cervix is dilating and getting  thinner.  WHAT TO REMEMBER  Keep up with your usual exercises and follow other instructions given by your health care provider.   Take medicines as directed by your health care provider.   Keep your regular prenatal appointments.   Eat and drink lightly if you think you are going into labor.   If Braxton Hicks contractions are making you uncomfortable:   Change your position from lying down or resting to walking, or from walking to resting.   Sit and rest in a tub of warm water.   Drink 2-3 glasses of water. Dehydration may cause these contractions.   Do slow and deep breathing several times an hour.  WHEN SHOULD I SEEK IMMEDIATE MEDICAL CARE? Seek immediate medical care if:  Your contractions become stronger, more regular, and closer together.   You have fluid leaking or gushing from your vagina.   You have a fever.   You pass blood-tinged mucus.   You have vaginal bleeding.   You have continuous abdominal pain.   You have low back pain that you never had before.   You feel your baby's head pushing down and causing pelvic pressure.   Your baby is not moving  as much as it used to.  Document Released: 07/30/2005 Document Revised: 08/04/2013 Document Reviewed: 05/11/2013 [facility] Patient Information 2015 [place], [place]. This information is not intended to replace advice given to you by your health care provider. Make sure you discuss any questions you have with your health care provider.

## 2014-09-05 NOTE — MAU Note (Signed)
Contractions started last night and have progressively gotten worse.  Denies LOF/VB.

## 2014-09-07 ENCOUNTER — Encounter (HOSPITAL_COMMUNITY): Payer: Self-pay

## 2014-09-07 ENCOUNTER — Inpatient Hospital Stay (HOSPITAL_COMMUNITY)
Admission: RE | Admit: 2014-09-07 | Discharge: 2014-09-10 | DRG: 775 | Disposition: A | Discharge status: Home / Self Care | Payer: Medicaid Other | Source: Ambulatory Visit | Attending: Obstetrics | Admitting: Obstetrics

## 2014-09-07 DIAGNOSIS — Z3A39 39 weeks gestation of pregnancy: Secondary | ICD-10-CM | POA: Diagnosis present

## 2014-09-07 DIAGNOSIS — O99824 Streptococcus B carrier state complicating childbirth: Secondary | ICD-10-CM | POA: Diagnosis present

## 2014-09-07 DIAGNOSIS — Z87891 Personal history of nicotine dependence: Secondary | ICD-10-CM | POA: Diagnosis not present

## 2014-09-07 DIAGNOSIS — O365930 Maternal care for other known or suspected poor fetal growth, third trimester, not applicable or unspecified: Secondary | ICD-10-CM | POA: Diagnosis present

## 2014-09-07 LAB — CBC
HEMATOCRIT: 30.3 % — AB (ref 36.0–46.0)
Hemoglobin: 10.8 g/dL — ABNORMAL LOW (ref 12.0–15.0)
MCH: 30.2 pg (ref 26.0–34.0)
MCHC: 35.6 g/dL (ref 30.0–36.0)
MCV: 84.6 fL (ref 78.0–100.0)
Platelets: 224 10*3/uL (ref 150–400)
RBC: 3.58 MIL/uL — ABNORMAL LOW (ref 3.87–5.11)
RDW: 13.6 % (ref 11.5–15.5)
WBC: 5.2 10*3/uL (ref 4.0–10.5)

## 2014-09-07 LAB — TYPE AND SCREEN
ABO/RH(D): A POS
Antibody Screen: NEGATIVE

## 2014-09-07 MED ORDER — TERBUTALINE SULFATE 1 MG/ML IJ SOLN: 0.2500 mg | Freq: Once | INTRAMUSCULAR | Status: AC | PRN

## 2014-09-07 MED ORDER — MISOPROSTOL 25 MCG QUARTER TABLET
25.0000 ug | ORAL_TABLET | ORAL | Status: DC | PRN
  Administered 2014-09-07 – 2014-09-08 (×2): 25 ug via VAGINAL
  Filled 2014-09-07 (×2): qty 0.25

## 2014-09-07 MED ORDER — CITRIC ACID-SODIUM CITRATE 334-500 MG/5ML PO SOLN: 30.0000 mL | ORAL | Status: DC | PRN

## 2014-09-07 MED ORDER — PENICILLIN G POTASSIUM 5000000 UNITS IJ SOLR
5.0000 10*6.[IU] | Freq: Once | INTRAVENOUS | Status: AC
  Administered 2014-09-08: 5 10*6.[IU] via INTRAVENOUS
  Filled 2014-09-07: qty 5

## 2014-09-07 MED ORDER — LACTATED RINGERS IV SOLN: 500.0000 mL | INTRAVENOUS | Status: DC | PRN

## 2014-09-07 MED ORDER — ONDANSETRON HCL 4 MG/2ML IJ SOLN: 4.0000 mg | Freq: Four times a day (QID) | INTRAMUSCULAR | Status: DC | PRN

## 2014-09-07 MED ORDER — LIDOCAINE HCL (PF) 1 % IJ SOLN
30.0000 mL | INTRAMUSCULAR | Status: DC | PRN
  Filled 2014-09-07: qty 30

## 2014-09-07 MED ORDER — OXYCODONE-ACETAMINOPHEN 5-325 MG PO TABS: 1.0000 | ORAL_TABLET | ORAL | Status: DC | PRN

## 2014-09-07 MED ORDER — ACETAMINOPHEN 325 MG PO TABS
650.0000 mg | ORAL_TABLET | ORAL | Status: DC | PRN
  Administered 2014-09-08: 650 mg via ORAL
  Filled 2014-09-07: qty 2

## 2014-09-07 MED ORDER — FLEET ENEMA 7-19 GM/118ML RE ENEM: 1.0000 | ENEMA | RECTAL | Status: DC | PRN

## 2014-09-07 MED ORDER — OXYTOCIN 40 UNITS IN LACTATED RINGERS INFUSION - SIMPLE MED: 62.5000 mL/h | INTRAVENOUS | Status: DC

## 2014-09-07 MED ORDER — LACTATED RINGERS IV SOLN
INTRAVENOUS | Status: DC
  Administered 2014-09-07 – 2014-09-08 (×3): via INTRAVENOUS

## 2014-09-07 MED ORDER — OXYTOCIN BOLUS FROM INFUSION
500.0000 mL | INTRAVENOUS | Status: DC
  Administered 2014-09-08: 500 mL via INTRAVENOUS

## 2014-09-07 MED ORDER — OXYCODONE-ACETAMINOPHEN 5-325 MG PO TABS: 2.0000 | ORAL_TABLET | ORAL | Status: DC | PRN

## 2014-09-07 MED ORDER — PENICILLIN G POTASSIUM 5000000 UNITS IJ SOLR
2.5000 10*6.[IU] | INTRAMUSCULAR | Status: DC
  Administered 2014-09-08 (×2): 2.5 10*6.[IU] via INTRAVENOUS
  Filled 2014-09-07 (×5): qty 2.5

## 2014-09-07 MED ORDER — BUTORPHANOL TARTRATE 1 MG/ML IJ SOLN
1.0000 mg | INTRAMUSCULAR | Status: DC | PRN
  Administered 2014-09-08: 1 mg via INTRAVENOUS
  Filled 2014-09-07: qty 1

## 2014-09-08 ENCOUNTER — Inpatient Hospital Stay (HOSPITAL_COMMUNITY): Payer: Medicaid Other | Admitting: Anesthesiology

## 2014-09-08 ENCOUNTER — Encounter: Payer: Medicaid Other | Admitting: Obstetrics

## 2014-09-08 ENCOUNTER — Encounter (HOSPITAL_COMMUNITY): Payer: Self-pay

## 2014-09-08 MED ORDER — WITCH HAZEL-GLYCERIN EX PADS
1.0000 "application " | MEDICATED_PAD | CUTANEOUS | Status: DC | PRN
  Administered 2014-09-10: 1 via TOPICAL

## 2014-09-08 MED ORDER — ZOLPIDEM TARTRATE 5 MG PO TABS
5.0000 mg | ORAL_TABLET | Freq: Every evening | ORAL | Status: DC | PRN
Start: 2014-09-08 — End: 2014-09-10

## 2014-09-08 MED ORDER — OXYTOCIN 40 UNITS IN LACTATED RINGERS INFUSION - SIMPLE MED
1.0000 m[IU]/min | INTRAVENOUS | Status: DC
  Administered 2014-09-08: 1 m[IU]/min via INTRAVENOUS
  Filled 2014-09-08: qty 1000

## 2014-09-08 MED ORDER — ONDANSETRON HCL 4 MG PO TABS: 4.0000 mg | ORAL_TABLET | ORAL | Status: DC | PRN

## 2014-09-08 MED ORDER — BUDESONIDE-FORMOTEROL FUMARATE 160-4.5 MCG/ACT IN AERO
2.0000 | INHALATION_SPRAY | Freq: Every day | RESPIRATORY_TRACT | Status: DC
  Filled 2014-09-08: qty 6

## 2014-09-08 MED ORDER — FENTANYL 2.5 MCG/ML BUPIVACAINE 1/10 % EPIDURAL INFUSION (WH - ANES)
14.0000 mL/h | INTRAMUSCULAR | Status: DC | PRN
  Administered 2014-09-08: 16 mL/h via EPIDURAL
  Filled 2014-09-08: qty 125

## 2014-09-08 MED ORDER — SIMETHICONE 80 MG PO CHEW: 80.0000 mg | CHEWABLE_TABLET | ORAL | Status: DC | PRN

## 2014-09-08 MED ORDER — LEVOTHYROXINE SODIUM 175 MCG PO TABS
175.0000 ug | ORAL_TABLET | Freq: Every day | ORAL | Status: DC
  Filled 2014-09-08: qty 1

## 2014-09-08 MED ORDER — ALBUTEROL SULFATE (2.5 MG/3ML) 0.083% IN NEBU: 3.0000 mL | INHALATION_SOLUTION | RESPIRATORY_TRACT | Status: DC | PRN

## 2014-09-08 MED ORDER — LACTATED RINGERS IV SOLN: 500.0000 mL | Freq: Once | INTRAVENOUS | Status: DC

## 2014-09-08 MED ORDER — OXYCODONE-ACETAMINOPHEN 5-325 MG PO TABS
1.0000 | ORAL_TABLET | ORAL | Status: DC | PRN
  Administered 2014-09-09 (×3): 1 via ORAL
  Filled 2014-09-08 (×3): qty 1

## 2014-09-08 MED ORDER — EPHEDRINE 5 MG/ML INJ
10.0000 mg | INTRAVENOUS | Status: DC | PRN
  Filled 2014-09-08: qty 2

## 2014-09-08 MED ORDER — PRENATAL MULTIVITAMIN CH
1.0000 | ORAL_TABLET | Freq: Every day | ORAL | Status: DC
  Administered 2014-09-09 – 2014-09-10 (×2): 1 via ORAL
  Filled 2014-09-08 (×2): qty 1

## 2014-09-08 MED ORDER — OXYTOCIN 40 UNITS IN LACTATED RINGERS INFUSION - SIMPLE MED
62.5000 mL/h | INTRAVENOUS | Status: DC | PRN
  Administered 2014-09-09: 62.5 mL/h via INTRAVENOUS
  Filled 2014-09-08: qty 1000

## 2014-09-08 MED ORDER — BENZOCAINE-MENTHOL 20-0.5 % EX AERO
1.0000 "application " | INHALATION_SPRAY | CUTANEOUS | Status: DC | PRN
  Administered 2014-09-08: 1 via TOPICAL
  Filled 2014-09-08: qty 56

## 2014-09-08 MED ORDER — OXYCODONE-ACETAMINOPHEN 5-325 MG PO TABS: 2.0000 | ORAL_TABLET | ORAL | Status: DC | PRN

## 2014-09-08 MED ORDER — LANOLIN HYDROUS EX OINT: TOPICAL_OINTMENT | CUTANEOUS | Status: DC | PRN

## 2014-09-08 MED ORDER — SENNOSIDES-DOCUSATE SODIUM 8.6-50 MG PO TABS
2.0000 | ORAL_TABLET | ORAL | Status: DC
  Administered 2014-09-09 (×2): 2 via ORAL
  Filled 2014-09-08 (×2): qty 2

## 2014-09-08 MED ORDER — ONDANSETRON HCL 4 MG/2ML IJ SOLN: 4.0000 mg | INTRAMUSCULAR | Status: DC | PRN

## 2014-09-08 MED ORDER — TETANUS-DIPHTH-ACELL PERTUSSIS 5-2.5-18.5 LF-MCG/0.5 IM SUSP: 0.5000 mL | Freq: Once | INTRAMUSCULAR | Status: DC

## 2014-09-08 MED ORDER — IBUPROFEN 600 MG PO TABS
600.0000 mg | ORAL_TABLET | Freq: Four times a day (QID) | ORAL | Status: DC
  Administered 2014-09-08 – 2014-09-10 (×8): 600 mg via ORAL
  Filled 2014-09-08 (×8): qty 1

## 2014-09-08 MED ORDER — LIDOCAINE HCL (PF) 1 % IJ SOLN
INTRAMUSCULAR | Status: DC | PRN
  Administered 2014-09-08: 4 mL
  Administered 2014-09-08: 6 mL

## 2014-09-08 MED ORDER — PHENYLEPHRINE 40 MCG/ML (10ML) SYRINGE FOR IV PUSH (FOR BLOOD PRESSURE SUPPORT)
80.0000 ug | PREFILLED_SYRINGE | INTRAVENOUS | Status: DC | PRN
  Filled 2014-09-08: qty 2

## 2014-09-08 MED ORDER — FENTANYL 2.5 MCG/ML BUPIVACAINE 1/10 % EPIDURAL INFUSION (WH - ANES): 14.0000 mL/h | INTRAMUSCULAR | Status: DC | PRN

## 2014-09-08 MED ORDER — PHENYLEPHRINE 40 MCG/ML (10ML) SYRINGE FOR IV PUSH (FOR BLOOD PRESSURE SUPPORT)
80.0000 ug | PREFILLED_SYRINGE | INTRAVENOUS | Status: DC | PRN
  Filled 2014-09-08: qty 2
  Filled 2014-09-08: qty 20

## 2014-09-08 MED ORDER — DIBUCAINE 1 % RE OINT
1.0000 "application " | TOPICAL_OINTMENT | RECTAL | Status: DC | PRN
  Administered 2014-09-10: 1 via RECTAL
  Filled 2014-09-08: qty 28

## 2014-09-08 MED ORDER — DIPHENHYDRAMINE HCL 25 MG PO CAPS: 25.0000 mg | ORAL_CAPSULE | Freq: Four times a day (QID) | ORAL | Status: DC | PRN

## 2014-09-08 MED ORDER — DIPHENHYDRAMINE HCL 50 MG/ML IJ SOLN: 12.5000 mg | INTRAMUSCULAR | Status: DC | PRN

## 2014-09-08 MED ORDER — TERBUTALINE SULFATE 1 MG/ML IJ SOLN
0.2500 mg | Freq: Once | INTRAMUSCULAR | Status: DC | PRN
  Filled 2014-09-08: qty 1

## 2014-09-08 NOTE — Progress Notes (Signed)
Gwendolyn PollenGabrielle I Clay is Clay 29 y.o. G2P1001 at 1962w2d by LMP admitted for induction of labor due to Poor fetal growth.  Subjective:   Objective: BP 109/67 mmHg  Pulse 67  Temp(Src) 97.7 F (36.5 C) (Oral)  Resp 18  Ht 5\' 10"  (1.778 m)  Wt 264 lb (119.75 kg)  BMI 37.88 kg/m2  LMP 12/07/2013      FHT:  FHR: 130 bpm, variability: moderate,  accelerations:  Present,  decelerations:  Present variable UC:   regular, every 2-4 minutes SVE:   Dilation: 3 Effacement (%): 70 Station: -2, -1 Exam by:: L. McDnaiel RN  Labs: Lab Results  Component Value Date   WBC 5.2 09/07/2014   HGB 10.8* 09/07/2014   HCT 30.3* 09/07/2014   MCV 84.6 09/07/2014   PLT 224 09/07/2014    Assessment / Plan: Induction of labor due to IUGR,  progressing well on pitocin  Labor: Progressing on Pitocin, will continue to increase then AROM Preeclampsia:  n/Clay Fetal Wellbeing:  Category I Pain Control:  Epidural I/D:  n/Clay Anticipated MOD:  NSVD  Gwendolyn Clay 09/08/2014, 12:25 PM

## 2014-09-08 NOTE — H&P (Signed)
Gwendolyn Clay is a 29 y.o. female presenting for IOL for IUGR. Maternal Medical History:  Reason for admission: IUGR.  IOL at 39 weeks recommended by MFM.   Prenatal complications: IUGR.   Prenatal Complications - Diabetes: none.    OB History    Gravida Para Term Preterm AB TAB SAB Ectopic Multiple Living   2 1 1       1      Past Medical History  Diagnosis Date  . Hypothyroidism   . History of chicken pox   . Dysmenorrhea   . Obese   . Bacterial infection   . Trichomonas   . Gonorrhea   . Asthma   . Allergic rhinitis     adenoidectomy  . Eczema    Past Surgical History  Procedure Laterality Date  . Adenoidectomy    . Wisdom tooth extraction     Family History: family history includes Allergic rhinitis in her brother; Arthritis in her maternal aunt; Asthma in an other family member; Cancer in her maternal grandfather; Diabetes in her father and maternal uncle; Eczema in her daughter and another family member; Hypertension in her maternal grandmother. Social History:  reports that she quit smoking about 5 months ago. Her smoking use included Cigarettes. She smoked 0.20 packs per day. She has never used smokeless tobacco. She reports that she does not drink alcohol or use illicit drugs.   Prenatal Transfer Tool  Maternal Diabetes: No Genetic Screening: Normal Maternal Ultrasounds/Referrals: Normal Fetal Ultrasounds or other Referrals:  Referred to Materal Fetal Medicine  Maternal Substance Abuse:  No Significant Maternal Medications:  None Significant Maternal Lab Results:  GBS positive  Review of Systems  All other systems reviewed and are negative.   Dilation: 3 Effacement (%): 70 Station: -2 Exam by:: L. McDaniel RN Blood pressure 117/77, pulse 79, temperature 97.7 F (36.5 C), temperature source Oral, resp. rate 18, height 5\' 10"  (1.778 m), weight 264 lb (119.75 kg), last menstrual period 12/07/2013. Maternal Exam:  Abdomen: Patient reports no  abdominal tenderness. Fetal presentation: vertex  Pelvis: adequate for delivery.   Cervix: Cervix evaluated by digital exam.     Physical Exam  Nursing note and vitals reviewed. Constitutional: She is oriented to person, place, and time. She appears well-developed and well-nourished.  HENT:  Head: Normocephalic and atraumatic.  Eyes: Conjunctivae are normal. Pupils are equal, round, and reactive to light.  Neck: Normal range of motion. Neck supple.  Cardiovascular: Normal rate and regular rhythm.   Respiratory: Effort normal and breath sounds normal.  GI: Soft.  Genitourinary: Vagina normal and uterus normal.  Musculoskeletal: Normal range of motion.  Neurological: She is alert and oriented to person, place, and time.  Skin: Skin is warm and dry.  Psychiatric: She has a normal mood and affect. Her behavior is normal. Judgment and thought content normal.    Prenatal labs: ABO, Rh: --/--/A POS (01/26 2045) Antibody: NEG (01/26 2045) Rubella: 9.54 (07/30 1143) RPR: NON REAC (10/29 1440)  HBsAg: NEGATIVE (07/30 1143)  HIV: NONREACTIVE (10/29 1440)  GBS: Detected (01/04 1514)   Assessment/Plan: 39 weeks.  IUGR.  2 stage IOL.   HARPER,CHARLES A 09/08/2014, 12:07 PM

## 2014-09-08 NOTE — Anesthesia Procedure Notes (Signed)
Epidural Patient location during procedure: OB  Preanesthetic Checklist Completed: patient identified, site marked, surgical consent, pre-op evaluation, timeout performed, IV checked, risks and benefits discussed and monitors and equipment checked  Epidural Patient position: sitting Prep: site prepped and draped and DuraPrep Patient monitoring: continuous pulse ox and blood pressure Approach: midline Location: L3-L4 Injection technique: LOR air  Needle:  Needle type: Tuohy  Needle gauge: 17 G Needle length: 9 cm and 9 Needle insertion depth: 7 cm Catheter type: closed end flexible Catheter size: 19 Gauge Catheter at skin depth: 14 cm Test dose: negative  Assessment Events: blood not aspirated, injection not painful, no injection resistance, negative IV test and no paresthesia  Additional Notes Dosing of Epidural:  1st dose, through catheter .............................................  Xylocaine 40 mg  2nd dose, through catheter, after waiting 3 minutes.........Xylocaine 60 mg    ( 1% Xylo charted as a single dose in Epic Meds for ease of charting; actual dosing was fractionated as above, for saftey's sake)  As each dose occurred, patient was free of IV sx; and patient exhibited no evidence of SA injection.  Patient is more comfortable after epidural dosed. Please see RN's note for documentation of vital signs,and FHR which are stable.  Patient reminded not to try to ambulate with numb legs, and that an RN must be present when she attempts to get up.       

## 2014-09-08 NOTE — Anesthesia Preprocedure Evaluation (Addendum)
Anesthesia Evaluation  Patient identified by MRN, date of birth, ID band Patient awake    Reviewed: Allergy & Precautions, H&P , Patient's Chart, lab work & pertinent test results  Airway Mallampati: II  TM Distance: >3 FB Neck ROM: full    Dental  (+) Teeth Intact   Pulmonary asthma (inhaler once/wk....Marland Kitchen.Marland Kitchen.This is typical) , neg recent URI, former smoker,  breath sounds clear to auscultation        Cardiovascular Rhythm:regular Rate:Normal     Neuro/Psych    GI/Hepatic   Endo/Other  Morbid obesity  Renal/GU      Musculoskeletal   Abdominal   Peds  Hematology   Anesthesia Other Findings       Reproductive/Obstetrics (+) Pregnancy                            Anesthesia Physical Anesthesia Plan  ASA: III  Anesthesia Plan: Epidural   Post-op Pain Management:    Induction:   Airway Management Planned:   Additional Equipment:   Intra-op Plan:   Post-operative Plan:   Informed Consent: I have reviewed the patients History and Physical, chart, labs and discussed the procedure including the risks, benefits and alternatives for the proposed anesthesia with the patient or authorized representative who has indicated his/her understanding and acceptance.   Dental Advisory Given  Plan Discussed with:   Anesthesia Plan Comments: (Labs checked- platelets confirmed with RN in room. Fetal heart tracing, per RN, reported to be stable enough for sitting procedure. Discussed epidural, and patient consents to the procedure:  included risk of possible headache,backache, failed block, allergic reaction, and nerve injury. This patient was asked if she had any questions or concerns before the procedure started.)        Anesthesia Quick Evaluation

## 2014-09-09 LAB — CBC
HEMATOCRIT: 31.7 % — AB (ref 36.0–46.0)
Hemoglobin: 11.2 g/dL — ABNORMAL LOW (ref 12.0–15.0)
MCH: 30.4 pg (ref 26.0–34.0)
MCHC: 35.3 g/dL (ref 30.0–36.0)
MCV: 85.9 fL (ref 78.0–100.0)
PLATELETS: 200 10*3/uL (ref 150–400)
RBC: 3.69 MIL/uL — ABNORMAL LOW (ref 3.87–5.11)
RDW: 13.6 % (ref 11.5–15.5)
WBC: 7 10*3/uL (ref 4.0–10.5)

## 2014-09-09 LAB — RPR: RPR Ser Ql: NONREACTIVE

## 2014-09-09 NOTE — Progress Notes (Signed)
Post Partum Day 1 Subjective: no complaints  Objective: Blood pressure 118/61, pulse 68, temperature 98.4 F (36.9 C), temperature source Oral, resp. rate 20, height 5\' 10"  (1.778 m), weight 264 lb (119.75 kg), last menstrual period 12/07/2013, SpO2 100 %, unknown if currently breastfeeding.  Physical Exam:  General: alert and no distress Lochia: appropriate Uterine Fundus: firm Incision: none DVT Evaluation: No evidence of DVT seen on physical exam.   Recent Labs  09/07/14 2045 09/09/14 0610  HGB 10.8* 11.2*  HCT 30.3* 31.7*    Assessment/Plan: Plan for discharge tomorrow   LOS: 2 days   Gwendolyn Clay A 09/09/2014, 7:19 AM

## 2014-09-09 NOTE — Progress Notes (Signed)
Ur chart review completed.  

## 2014-09-09 NOTE — Addendum Note (Signed)
Addendum  created 09/09/14 0800 by Yolonda KidaAlison L Georgena Weisheit, CRNA   Modules edited: Notes Section   Notes Section:  File: [id number]

## 2014-09-09 NOTE — Anesthesia Postprocedure Evaluation (Signed)
  Anesthesia Post-op Note  Patient: Horald PollenGabrielle I Ardoin  Procedure(s) Performed: * No procedures listed *  Patient Location: Mother/Baby  Anesthesia Type:Epidural  Level of Consciousness: awake, alert , oriented and patient cooperative  Airway and Oxygen Therapy: Patient Spontanous Breathing  Post-op Pain: mild  Post-op Assessment: Post-op Vital signs reviewed, Patient's Cardiovascular Status Stable, Respiratory Function Stable, Patent Airway, No headache, No backache, No residual numbness and No residual motor weakness  Post-op Vital Signs: Reviewed and stable  Last Vitals:  Filed Vitals:   09/09/14 0525  BP: 118/61  Pulse: 68  Temp: 36.9 C  Resp: 20    Complications: No apparent anesthesia complications

## 2014-09-09 NOTE — Lactation Note (Signed)
This note was copied from the chart of Gwendolyn Clay. Lactation Consultation Note  Mother states this baby is latching well.  Denies questions or problems. First child had difficulty latching and she pumped for 6 weeks.  She is excited this baby is doing much better. Reviewed BF basics, cluster feeding. Mom encouraged to feed baby 8-12 times/24 hours and with feeding cues.  Mom made aware of O/P services, breastfeeding support groups, community resources, and our phone # for post-discharge questions.      Patient Name: Gwendolyn Clay NWGNF'AToday's Date: 09/09/2014 Reason for consult: Initial assessment   Maternal Data Has patient been taught Hand Expression?: Yes Does the patient have breastfeeding experience prior to this delivery?: Yes  Feeding Feeding Type: Breast Milk  LATCH Score/Interventions Latch: Repeated attempts needed to sustain latch, nipple held in mouth throughout feeding, stimulation needed to elicit sucking reflex. Intervention(s): Assist with latch  Audible Swallowing: None  Type of Nipple: Everted at rest and after stimulation  Comfort (Breast/Nipple): Soft / non-tender     Hold (Positioning): No assistance needed to correctly position infant at breast.  LATCH Score: 7  Lactation Tools Discussed/Used     Consult Status Consult Status: Follow-up Date: 09/10/14 Follow-up type: In-patient    Dahlia ByesBerkelhammer, Dago Jungwirth [facility] 09/09/2014, 11:57 AM

## 2014-09-10 MED ORDER — OXYCODONE-ACETAMINOPHEN 5-325 MG PO TABS: 1.0000 | ORAL_TABLET | ORAL | Status: DC | PRN

## 2014-09-10 MED ORDER — IBUPROFEN 600 MG PO TABS: 600.0000 mg | ORAL_TABLET | Freq: Four times a day (QID) | ORAL | Status: DC | PRN

## 2014-09-10 NOTE — Lactation Note (Addendum)
This note was copied from the chart of [facility]. Lactation Consultation Note  Patient Name: Gwendolyn Clay VUYEB'X Date: 09/10/2014 Reason for consult: Follow-up assessment   Follow-up with a 52 hr old infant with a 7% weight loss; BRx7 (15-30 min) in past 24 hrs + 2 attempts; voids-3 in 24 hrs/ 5 life; stools-1 in 24 hrs/ 2 life.  Mom had infant at breast upon entering room with a shallow latch and no consistent sucking pattern.  Mom states he sleeps a lot at the breast and what LC was seeing was typical for infant.  LC suspects minimal milk transfer thus 7% weight loss.  Worked with mom to get infant latched using cross-cradle hold and asymmetrical latching technique for depth with manual flanging of bottom lip.  Could not awaken infant enough for latching with depth and to stay in a consistent pattern.  Mom stated MD told her to supplement after feedings.  Day of Life supplementation guideline given and explained how to supplement with EBM or formula after breastfeeding using paced feeding method and slow flow nipples (mom stated she wants to use bottles for supplementation at home).  Encouraged mom to have another person given bottles to minimize nipple confusion and to post pump using hands-on pumping method and hand expression at end of pumping session as demonstrated / explained to mom.  Encouraged mom to breastfeed infant 8-12 times / day and with feeding cues.  Mom had DEBP kit in room and showed how to use double hand pump from kit,  Mom made appointment with [facility] for Friday, Feb 5.  Has [facility] Loaner paperwork in room with instructions to call [facility] when she has paperwork completed and money for [place].  Encouraged to call for questions after discharge if needed.  Informed of outpatient consults as needed.    Follow up weight check with peds tomorrow (Saturday) at 0900 and on Monday with appointment.  Mom called at 1200 to let [facility] know she does not have money for a [facility] Loaner but  stated she may return on Monday to get a [facility] Loaner to get her through the week until her appointment with [facility] on Friday, Feb 5.  Feeding Feeding Type: Breast Fed  LATCH Score/Interventions Latch: Repeated attempts needed to sustain latch, nipple held in mouth throughout feeding, stimulation needed to elicit sucking reflex. Intervention(s): Waking techniques Intervention(s): Breast compression  Audible Swallowing: None Intervention(s): Hand expression  Type of Nipple: Everted at rest and after stimulation  Comfort (Breast/Nipple): Soft / non-tender     Hold (Positioning): Assistance needed to correctly position infant at breast and maintain latch. Intervention(s): Breastfeeding basics reviewed;Position options;Skin to skin (sleepy at current feeding)  LATCH Score: 6  Lactation Tools Discussed/Used [facility] Program: No (appt made for Feb 5) Pump Review: Setup, frequency, and cleaning   Consult Status Consult Status: Complete    Merlene Laughter 09/10/2014, 10:37 AM

## 2014-09-10 NOTE — Discharge Summary (Signed)
Obstetric Discharge Summary Reason for Admission: induction of labor Prenatal Procedures: NST and ultrasound Intrapartum Procedures: spontaneous vaginal delivery Postpartum Procedures: none Complications-Operative and Postpartum: none HEMOGLOBIN  Date Value Ref Range Status  09/09/2014 11.2* 12.0 - 15.0 g/dL Final   HCT  Date Value Ref Range Status  09/09/2014 31.7* 36.0 - 46.0 % Final    Physical Exam:  General: alert and no distress Lochia: appropriate Uterine Fundus: firm Incision: none DVT Evaluation: No evidence of DVT seen on physical exam.  Discharge Diagnoses: Term Pregnancy-delivered  Discharge Information: Date: 09/10/2014 Activity: pelvic rest Diet: routine Medications: PNV, Ibuprofen, Colace and Percocet Condition: stable Instructions: refer to practice specific booklet Discharge to: home Follow-up Information    Follow up with HARPER,CHARLES A, MD. Schedule an appointment as soon as possible for a visit in 2 weeks.   Specialty:  Obstetrics and Gynecology   Contact information:   [address] [place] [id number] [phone]       Newborn Data: Live born female  Birth Weight: 6 lb 2.1 oz (2780 g) APGAR: 9, 9  Home with mother.  HARPER,CHARLES A 09/10/2014, 8:07 AM

## 2014-09-10 NOTE — Progress Notes (Signed)
Post Partum Day 2 Subjective: no complaints  Objective: Blood pressure 104/54, pulse 60, temperature 98.3 F (36.8 C), temperature source Oral, resp. rate 18, height 5\' 10"  (1.778 m), weight 264 lb (119.75 kg), last menstrual period 12/07/2013, SpO2 100 %, unknown if currently breastfeeding.  Physical Exam:  General: alert and no distress Lochia: appropriate Uterine Fundus: firm Incision: none DVT Evaluation: No evidence of DVT seen on physical exam.   Recent Labs  09/07/14 2045 09/09/14 0610  HGB 10.8* 11.2*  HCT 30.3* 31.7*    Assessment/Plan: Discharge home   LOS: 3 days   Jazmon Kos A 09/10/2014, 8:03 AM

## 2014-09-16 NOTE — Telephone Encounter (Signed)
Task completed

## 2014-09-27 ENCOUNTER — Ambulatory Visit: Payer: Medicaid Other | Admitting: Obstetrics

## 2014-10-06 ENCOUNTER — Ambulatory Visit: Payer: Medicaid Other | Admitting: Obstetrics

## 2014-10-08 ENCOUNTER — Ambulatory Visit: Payer: Medicaid Other | Admitting: Obstetrics

## 2014-10-12 ENCOUNTER — Ambulatory Visit: Payer: Medicaid Other | Admitting: Obstetrics

## 2014-10-21 ENCOUNTER — Ambulatory Visit: Payer: Medicaid Other | Admitting: Certified Nurse Midwife
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
## 2014-11-02 ENCOUNTER — Encounter: Payer: Self-pay | Admitting: Obstetrics

## 2014-11-09 ENCOUNTER — Ambulatory Visit: Payer: Medicaid Other | Admitting: Certified Nurse Midwife

## 2014-11-16 ENCOUNTER — Ambulatory Visit: Payer: Medicaid Other | Admitting: Certified Nurse Midwife

## 2014-11-18 ENCOUNTER — Ambulatory Visit: Payer: Medicaid Other | Admitting: Certified Nurse Midwife

## 2014-11-19 ENCOUNTER — Ambulatory Visit: Payer: Medicaid Other | Admitting: Certified Nurse Midwife

## 2014-12-28 ENCOUNTER — Ambulatory Visit: Payer: Medicaid Other | Admitting: Certified Nurse Midwife

## 2014-12-28 ENCOUNTER — Ambulatory Visit: Payer: Medicaid Other | Admitting: Obstetrics

## 2014-12-30 ENCOUNTER — Ambulatory Visit: Payer: Medicaid Other | Admitting: Obstetrics

## 2015-01-25 ENCOUNTER — Ambulatory Visit: Payer: Medicaid Other | Admitting: Obstetrics

## 2015-01-31 ENCOUNTER — Ambulatory Visit: Payer: Medicaid Other | Admitting: Obstetrics

## 2015-02-02 ENCOUNTER — Encounter: Payer: Self-pay | Admitting: Obstetrics

## 2015-02-02 ENCOUNTER — Ambulatory Visit (INDEPENDENT_AMBULATORY_CARE_PROVIDER_SITE_OTHER): Payer: Medicaid Other | Admitting: Obstetrics

## 2015-02-02 VITALS — BP 95/67 | HR 98 | Temp 99.2°F | Wt 251.0 lb

## 2015-02-02 DIAGNOSIS — Z30018 Encounter for initial prescription of other contraceptives: Secondary | ICD-10-CM | POA: Diagnosis not present

## 2015-02-02 DIAGNOSIS — Z01419 Encounter for gynecological examination (general) (routine) without abnormal findings: Secondary | ICD-10-CM

## 2015-02-02 DIAGNOSIS — Z124 Encounter for screening for malignant neoplasm of cervix: Secondary | ICD-10-CM | POA: Diagnosis not present

## 2015-02-02 DIAGNOSIS — Z0000 Encounter for general adult medical examination without abnormal findings: Secondary | ICD-10-CM

## 2015-02-02 NOTE — Progress Notes (Signed)
Subjective:        Gwendolyn Clay is a 29 y.o. female here for a routine exam.  Current complaints: none.  Wants long term reversible contraception.  Personal health questionnaire:  Is patient Ashkenazi Jewish, have a family history of breast and/or ovarian cancer: no Is there a family history of uterine cancer diagnosed at age < 32, gastrointestinal cancer, urinary tract cancer, family member who is a Personnel officer syndrome-associated carrier: no Is the patient overweight and hypertensive, family history of diabetes, personal history of gestational diabetes, preeclampsia or PCOS: no Is patient over 74, have PCOS,  family history of premature CHD under age 69, diabetes, smoke, have hypertension or peripheral artery disease:  no At any time, has a partner hit, kicked or otherwise hurt or frightened you?: no Over the past 2 weeks, have you felt down, depressed or hopeless?: no Over the past 2 weeks, have you felt little interest or pleasure in doing things?:no   Gynecologic History Patient's last menstrual period was 02/02/2015. Contraception: none Last Pap: 2015. Results were: normal Last mammogram: n/a. Results were: n/a  Obstetric History OB History  Gravida Para Term Preterm AB SAB TAB Ectopic Multiple Living  2 2 2       0 2    # Outcome Date GA Lbr Len/2nd Weight Sex Delivery Anes PTL Lv  2 Term 09/08/14 [redacted]w[redacted]d 00:40 / 00:06 6 lb 2.1 oz (2.78 kg) M Vag-Spont EPI  Y  1 Term 03/09/09 [redacted]w[redacted]d  5 lb 12 oz (2.608 kg) F Vag-Spont EPI  Y      Past Medical History  Diagnosis Date  . Hypothyroidism   . History of chicken pox   . Dysmenorrhea   . Obese   . Bacterial infection   . Trichomonas   . Gonorrhea   . Asthma   . Allergic rhinitis     adenoidectomy  . Eczema     Past Surgical History  Procedure Laterality Date  . Adenoidectomy    . Wisdom tooth extraction       Current outpatient prescriptions:  .  albuterol (PROVENTIL HFA;VENTOLIN HFA) 108 (90 BASE) MCG/ACT  inhaler, Inhale 2 puffs into the lungs every 4 (four) hours as needed. For asthma, Disp: 1 Inhaler, Rfl: 0 .  Biotin 1 MG CAPS, Take by mouth., Disp: , Rfl:  .  hydrocortisone 2.5 % cream, Apply topically 2 (two) times daily., Disp: 28 g, Rfl: 0 .  ibuprofen (ADVIL,MOTRIN) 600 MG tablet, Take 1 tablet (600 mg total) by mouth every 6 (six) hours as needed for mild pain., Disp: 30 tablet, Rfl: 5 .  levothyroxine (SYNTHROID) 175 MCG tablet, Take 1 tablet (175 mcg total) by mouth daily before breakfast., Disp: 30 tablet, Rfl: 0 .  Prenatal Vit-Fe Fumarate-FA (PRENATAL MULTIVITAMIN) TABS tablet, Take 1 tablet by mouth at bedtime. , Disp: , Rfl:  .  budesonide-formoterol (SYMBICORT) 160-4.5 MCG/ACT inhaler, Inhale 2 puffs into the lungs daily. (Patient not taking: Reported on 02/02/2015), Disp: 1 Inhaler, Rfl: 12 .  omeprazole (PRILOSEC) 20 MG capsule, Take 1 capsule (20 mg total) by mouth 2 (two) times daily before a meal. (Patient not taking: Reported on 02/02/2015), Disp: 60 capsule, Rfl: 5 .  ondansetron (ZOFRAN ODT) 8 MG disintegrating tablet, Take 1 tablet (8 mg total) by mouth every 8 (eight) hours as needed for nausea or vomiting. (Patient not taking: Reported on 09/07/2014), Disp: 20 tablet, Rfl: 2 .  oxyCODONE-acetaminophen (PERCOCET/ROXICET) 5-325 MG per tablet, Take 1-2 tablets by mouth  every 4 (four) hours as needed for moderate pain or severe pain (for pain scale equal to or greater than 7). (Patient not taking: Reported on 02/02/2015), Disp: 40 tablet, Rfl: 0 Allergies  Allergen Reactions  . Mold Extract [Trichophyton] Other (See Comments)    Wheezing   . Other     Roaches cause wheezing     History  Substance Use Topics  . Smoking status: Former Smoker -- 0.20 packs/day    Types: Cigarettes    Quit date: 03/30/2014  . Smokeless tobacco: Never Used  . Alcohol Use: No     Comment: occasional    Family History  Problem Relation Age of Onset  . Arthritis Maternal Aunt   . Diabetes  Maternal Uncle   . Hypertension Maternal Grandmother   . Cancer Maternal Grandfather     PROSTATE  . Asthma      neices and nephews  . Allergic rhinitis Brother   . Eczema      neices and nephews  . Eczema Daughter   . Diabetes Father       Review of Systems  Constitutional: negative for fatigue and weight loss Respiratory: negative for cough and wheezing Cardiovascular: negative for chest pain, fatigue and palpitations Gastrointestinal: negative for abdominal pain and change in bowel habits Musculoskeletal:negative for myalgias Neurological: negative for gait problems and tremors Behavioral/Psych: negative for abusive relationship, depression Endocrine: negative for temperature intolerance   Genitourinary:negative for abnormal menstrual periods, genital lesions, hot flashes, sexual problems and vaginal discharge Integument/breast: negative for breast lump, breast tenderness, nipple discharge and skin lesion(s)    Objective:       BP 95/67 mmHg  Pulse 98  Temp(Src) 99.2 F (37.3 C)  Wt 251 lb (113.853 kg)  LMP 02/02/2015 General:   alert  Skin:   no rash or abnormalities  Lungs:   clear to auscultation bilaterally  Heart:   regular rate and rhythm, S1, S2 normal, no murmur, click, rub or gallop  Breasts:   normal without suspicious masses, skin or nipple changes or axillary nodes  Abdomen:  normal findings: no organomegaly, soft, non-tender and no hernia  Pelvis:  External genitalia: normal general appearance Urinary system: urethral meatus normal and bladder without fullness, nontender Vaginal: normal without tenderness, induration or masses Cervix: normal appearance Adnexa: normal bimanual exam Uterus: anteverted and non-tender, normal size   Lab Review Urine pregnancy test Labs reviewed yes Radiologic studies reviewed no    Assessment:    Healthy female exam.    Contraceptive management   Plan:    Education reviewed: low fat, low cholesterol diet,  safe sex/STD prevention, self breast exams and weight bearing exercise. Contraception: Nexplanon. Follow up in: 2 weeks.  Nexplanon insertion.  Meds ordered this encounter  Medications  . Biotin 1 MG CAPS    Sig: Take by mouth.   No orders of the defined types were placed in this encounter.

## 2015-02-02 NOTE — Addendum Note (Signed)
Addended by: Henriette Combs on: 02/02/2015 05:24 PM   Modules accepted: Orders

## 2015-02-03 LAB — PAP IG W/ RFLX HPV ASCU

## 2015-02-04 ENCOUNTER — Telehealth: Payer: Self-pay | Admitting: *Deleted

## 2015-02-04 NOTE — Telephone Encounter (Signed)
Patient is interested in the Nexplanon for contraception. Per Dr. Clearance Coots patient to be scheduled about 2 weeks after her last appointment. Patient has been scheduled for 02-17-15 @ 2:30 pm.

## 2015-02-05 LAB — SURESWAB, VAGINOSIS/VAGINITIS PLUS
ATOPOBIUM VAGINAE: 7.6 Log (cells/mL)
C. ALBICANS, DNA: NOT DETECTED
C. TROPICALIS, DNA: NOT DETECTED
C. glabrata, DNA: NOT DETECTED
C. parapsilosis, DNA: NOT DETECTED
C. trachomatis RNA, TMA: NOT DETECTED
Gardnerella vaginalis: >8 Log (cells/mL)
LACTOBACILLUS SPECIES: NOT DETECTED Log (cells/mL)
MEGASPHAERA SPECIES: 7.9 Log (cells/mL)
N. GONORRHOEAE RNA, TMA: NOT DETECTED
T. vaginalis RNA, QL TMA: NOT DETECTED

## 2015-02-07 ENCOUNTER — Other Ambulatory Visit: Payer: Self-pay | Admitting: *Deleted

## 2015-02-07 DIAGNOSIS — N760 Acute vaginitis: Principal | ICD-10-CM

## 2015-02-07 DIAGNOSIS — B9689 Other specified bacterial agents as the cause of diseases classified elsewhere: Secondary | ICD-10-CM

## 2015-02-07 MED ORDER — TINIDAZOLE 500 MG PO TABS: 500.0000 mg | ORAL_TABLET | Freq: Every day | ORAL | Status: DC

## 2015-02-07 NOTE — Progress Notes (Unsigned)
See lab note.  

## 2015-02-17 ENCOUNTER — Ambulatory Visit: Payer: Medicaid Other | Admitting: Obstetrics

## 2015-02-18 ENCOUNTER — Ambulatory Visit (INDEPENDENT_AMBULATORY_CARE_PROVIDER_SITE_OTHER): Payer: Medicaid Other | Admitting: Certified Nurse Midwife

## 2015-02-18 ENCOUNTER — Encounter: Payer: Self-pay | Admitting: Certified Nurse Midwife

## 2015-02-18 VITALS — BP 103/63 | HR 92 | Temp 98.7°F | Ht 70.0 in | Wt 253.8 lb

## 2015-02-18 DIAGNOSIS — Z3049 Encounter for surveillance of other contraceptives: Secondary | ICD-10-CM

## 2015-02-18 DIAGNOSIS — Z3202 Encounter for pregnancy test, result negative: Secondary | ICD-10-CM

## 2015-02-18 DIAGNOSIS — Z01812 Encounter for preprocedural laboratory examination: Secondary | ICD-10-CM

## 2015-02-18 LAB — POCT URINE PREGNANCY: Preg Test, Ur: NEGATIVE

## 2015-02-18 NOTE — Progress Notes (Signed)
Patient ID: [name], female   DOB: 1985-12-15, 29 y.o.   MRN: [id number]  NEXPLANON INSERTION NOTE  Date of LMP:   Unknown, breastfeeding  Contraception used: Nexplanon  Pregnancy test result:  Lab Results  Component Value Date   PREGTESTUR Positive 03/11/2014   HCG 17600.0 03/30/2014    Indications:  The patient desires contraception.  She understands risks, benefits, and alternatives to Implanon and would like to proceed.  Anesthesia:   Lidocaine 1% plain.  Procedure:  A time-out was performed confirming the procedure and the patient's allergy status.  The patient's non-dominant was identified as the right arm.  The protection cap was removed. While placing countertraction on the skin, the needle was inserted at a 30 degree angle.  The applicator was held horizontal to the skin; the skin was tented upward as the needle was introduced into the subdermal space.  While holding the applicator in place, the slider was unlocked. The Nexplanon was removed from the field.  The Nexplanon was palpated to ensure proper placement.  Complications: None LOT: [id number] EXP: 01/2017  Instructions:  The patient was instructed to remove the dressing in 24 hours and that some bruising is to be expected.  She was advised to use over the counter analgesics as needed for any pain at the site.  She is to keep the area dry for 24 hours and to call if her hand or arm becomes cold, numb, or blue.  Return visit:  Return in 2 weeks

## 2015-03-18 ENCOUNTER — Ambulatory Visit: Payer: Medicaid Other | Admitting: Certified Nurse Midwife

## 2015-03-23 ENCOUNTER — Ambulatory Visit: Payer: Medicaid Other | Admitting: Certified Nurse Midwife

## 2015-03-25 ENCOUNTER — Ambulatory Visit: Payer: Medicaid Other | Admitting: Certified Nurse Midwife

## 2015-04-01 ENCOUNTER — Ambulatory Visit: Payer: Medicaid Other | Admitting: Certified Nurse Midwife

## 2015-04-13 ENCOUNTER — Ambulatory Visit: Payer: Medicaid Other | Admitting: Certified Nurse Midwife

## 2015-04-13 ENCOUNTER — Telehealth: Payer: Self-pay | Admitting: *Deleted

## 2015-04-13 MED ORDER — LEVOTHYROXINE SODIUM 175 MCG PO TABS: 175.0000 ug | ORAL_TABLET | Freq: Every day | ORAL | Status: DC

## 2015-04-13 NOTE — Telephone Encounter (Signed)
Patient is requesting a refill of her synthroid. 4:00 Call to patient- advised her that she will need follow up with her primary care/ endocrinologist since she is no longer pregnant. Patient states she had quit the medication and only restarted when she was pregnant. Told patient we would refill her until she could get an appointment for follow up.

## 2015-04-19 ENCOUNTER — Ambulatory Visit: Payer: Medicaid Other | Admitting: Certified Nurse Midwife

## 2015-05-20 ENCOUNTER — Telehealth: Payer: Self-pay | Admitting: Certified Nurse Midwife

## 2015-05-20 ENCOUNTER — Ambulatory Visit: Payer: Medicaid Other | Admitting: Certified Nurse Midwife

## 2015-05-23 NOTE — Telephone Encounter (Signed)
Close encounter 

## 2015-05-29 ENCOUNTER — Encounter (HOSPITAL_COMMUNITY): Payer: Self-pay | Admitting: *Deleted

## 2015-05-29 ENCOUNTER — Emergency Department (HOSPITAL_COMMUNITY)
Admission: EM | Admit: 2015-05-29 | Discharge: 2015-05-30 | Disposition: A | Discharge status: Home / Self Care | Payer: Medicaid Other | Attending: Emergency Medicine | Admitting: Emergency Medicine

## 2015-05-29 ENCOUNTER — Emergency Department (HOSPITAL_COMMUNITY): Payer: Medicaid Other

## 2015-05-29 DIAGNOSIS — Z8742 Personal history of other diseases of the female genital tract: Secondary | ICD-10-CM | POA: Diagnosis not present

## 2015-05-29 DIAGNOSIS — Z8619 Personal history of other infectious and parasitic diseases: Secondary | ICD-10-CM | POA: Insufficient documentation

## 2015-05-29 DIAGNOSIS — S79919A Unspecified injury of unspecified hip, initial encounter: Secondary | ICD-10-CM

## 2015-05-29 DIAGNOSIS — S199XXA Unspecified injury of neck, initial encounter: Secondary | ICD-10-CM | POA: Diagnosis present

## 2015-05-29 DIAGNOSIS — E669 Obesity, unspecified: Secondary | ICD-10-CM | POA: Insufficient documentation

## 2015-05-29 DIAGNOSIS — Y999 Unspecified external cause status: Secondary | ICD-10-CM | POA: Diagnosis not present

## 2015-05-29 DIAGNOSIS — S79912A Unspecified injury of left hip, initial encounter: Secondary | ICD-10-CM | POA: Insufficient documentation

## 2015-05-29 DIAGNOSIS — J45909 Unspecified asthma, uncomplicated: Secondary | ICD-10-CM | POA: Insufficient documentation

## 2015-05-29 DIAGNOSIS — E039 Hypothyroidism, unspecified: Secondary | ICD-10-CM | POA: Insufficient documentation

## 2015-05-29 DIAGNOSIS — Z87891 Personal history of nicotine dependence: Secondary | ICD-10-CM | POA: Insufficient documentation

## 2015-05-29 DIAGNOSIS — S299XXA Unspecified injury of thorax, initial encounter: Secondary | ICD-10-CM | POA: Insufficient documentation

## 2015-05-29 DIAGNOSIS — S161XXA Strain of muscle, fascia and tendon at neck level, initial encounter: Secondary | ICD-10-CM | POA: Insufficient documentation

## 2015-05-29 DIAGNOSIS — Z79899 Other long term (current) drug therapy: Secondary | ICD-10-CM | POA: Insufficient documentation

## 2015-05-29 DIAGNOSIS — S3993XA Unspecified injury of pelvis, initial encounter: Secondary | ICD-10-CM | POA: Insufficient documentation

## 2015-05-29 DIAGNOSIS — Z872 Personal history of diseases of the skin and subcutaneous tissue: Secondary | ICD-10-CM | POA: Diagnosis not present

## 2015-05-29 DIAGNOSIS — Y9389 Activity, other specified: Secondary | ICD-10-CM | POA: Insufficient documentation

## 2015-05-29 DIAGNOSIS — Y92410 Unspecified street and highway as the place of occurrence of the external cause: Secondary | ICD-10-CM | POA: Diagnosis not present

## 2015-05-29 DIAGNOSIS — S3992XA Unspecified injury of lower back, initial encounter: Secondary | ICD-10-CM | POA: Insufficient documentation

## 2015-05-29 DIAGNOSIS — S79911A Unspecified injury of right hip, initial encounter: Secondary | ICD-10-CM | POA: Insufficient documentation

## 2015-05-29 MED ORDER — IPRATROPIUM-ALBUTEROL 0.5-2.5 (3) MG/3ML IN SOLN
3.0000 mL | Freq: Once | RESPIRATORY_TRACT | Status: AC
  Administered 2015-05-29: 3 mL via RESPIRATORY_TRACT
  Filled 2015-05-29: qty 3

## 2015-05-29 MED ORDER — CYCLOBENZAPRINE HCL 10 MG PO TABS
10.0000 mg | ORAL_TABLET | Freq: Once | ORAL | Status: AC
  Administered 2015-05-29: 10 mg via ORAL
  Filled 2015-05-29: qty 1

## 2015-05-29 MED ORDER — HYDROCODONE-ACETAMINOPHEN 5-325 MG PO TABS
2.0000 | ORAL_TABLET | Freq: Once | ORAL | Status: AC
  Administered 2015-05-29: 2 via ORAL
  Filled 2015-05-29: qty 2
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
## 2015-05-30 MED ORDER — CYCLOBENZAPRINE HCL 10 MG PO TABS: 10.0000 mg | ORAL_TABLET | Freq: Two times a day (BID) | ORAL | Status: DC | PRN

## 2015-05-30 MED ORDER — MELOXICAM 15 MG PO TABS: 15.0000 mg | ORAL_TABLET | Freq: Every day | ORAL | Status: DC

## 2015-05-30 NOTE — Discharge Instructions (Signed)
Take Mobic as needed for pain. Take flexeril as needed for muscle spasm. You may take these medications together. Refer to attached documents for more information.  °

## 2015-09-13 ENCOUNTER — Encounter: Payer: Self-pay | Admitting: Obstetrics

## 2015-09-13 ENCOUNTER — Ambulatory Visit (INDEPENDENT_AMBULATORY_CARE_PROVIDER_SITE_OTHER): Payer: Medicaid Other | Admitting: Obstetrics

## 2015-09-13 VITALS — BP 108/76 | HR 78 | Temp 98.3°F | Wt 226.0 lb

## 2015-09-13 DIAGNOSIS — N939 Abnormal uterine and vaginal bleeding, unspecified: Secondary | ICD-10-CM

## 2015-09-13 DIAGNOSIS — K5900 Constipation, unspecified: Secondary | ICD-10-CM

## 2015-09-13 DIAGNOSIS — N946 Dysmenorrhea, unspecified: Secondary | ICD-10-CM | POA: Diagnosis not present

## 2015-09-13 DIAGNOSIS — A6000 Herpesviral infection of urogenital system, unspecified: Secondary | ICD-10-CM | POA: Diagnosis not present

## 2015-09-13 DIAGNOSIS — Z0000 Encounter for general adult medical examination without abnormal findings: Secondary | ICD-10-CM

## 2015-09-13 DIAGNOSIS — Z3049 Encounter for surveillance of other contraceptives: Secondary | ICD-10-CM | POA: Diagnosis not present

## 2015-09-13 DIAGNOSIS — Z975 Presence of (intrauterine) contraceptive device: Secondary | ICD-10-CM

## 2015-09-13 DIAGNOSIS — K641 Second degree hemorrhoids: Secondary | ICD-10-CM

## 2015-09-13 MED ORDER — LEVONORGEST-ETH ESTRAD 91-DAY 0.15-0.03 MG PO TABS
1.0000 | ORAL_TABLET | Freq: Every day | ORAL | Status: DC
Start: 2015-09-13 — End: 2016-01-05

## 2015-09-13 MED ORDER — IBUPROFEN 800 MG PO TABS: 800.0000 mg | ORAL_TABLET | Freq: Three times a day (TID) | ORAL | Status: DC | PRN

## 2015-09-13 MED ORDER — VALACYCLOVIR HCL 1 G PO TABS: 1000.0000 mg | ORAL_TABLET | Freq: Two times a day (BID) | ORAL | Status: DC

## 2015-09-13 MED ORDER — VITAFOL ULTRA 29-0.6-0.4-200 MG PO CAPS: 1.0000 | ORAL_CAPSULE | Freq: Every day | ORAL | Status: DC

## 2015-09-13 NOTE — Progress Notes (Signed)
Subjective:    Gwendolyn Clay is a 30 y.o. female who presents for 6 month check up after Nexplanon insertion. The patient has started to have irregular spotting. The patient is sexually active. Pertinent past medical history: none.  The information documented in the HPI was reviewed and verified.  Menstrual History: OB History    Gravida Para Term Preterm AB TAB SAB Ectopic Multiple Living   0 2      No LMP recorded.   Patient Active Problem List   Diagnosis Date Noted  . NSVD (normal spontaneous vaginal delivery) 09/08/2014  . Normal labor and delivery 09/07/2014  . Nausea with vomiting 02/13/2014  . Back pain 08/02/2012  . Status asthmaticus 08/02/2012  . Allergic rhinitis   . Eczema   . Asthma   . Hypothyroidism   . Obese    Past Medical History  Diagnosis Date  . Hypothyroidism   . History of chicken pox   . Dysmenorrhea   . Obese   . Bacterial infection   . Trichomonas   . Gonorrhea   . Asthma   . Allergic rhinitis     adenoidectomy  . Eczema     Past Surgical History  Procedure Laterality Date  . Adenoidectomy    . Wisdom tooth extraction       Current outpatient prescriptions:  .  ibuprofen (ADVIL,MOTRIN) 800 MG tablet, Take 1 tablet (800 mg total) by mouth every 8 (eight) hours as needed., Disp: 30 tablet, Rfl: 5 .  levonorgestrel-ethinyl estradiol (SEASONALE,INTROVALE,JOLESSA) 0.15-0.03 MG tablet, Take 1 tablet by mouth daily., Disp: 1 Package, Rfl: 4 .  levothyroxine (SYNTHROID) 175 MCG tablet, Take 1 tablet (175 mcg total) by mouth daily before breakfast. (Patient not taking: Reported on 09/13/2015), Disp: 30 tablet, Rfl: 2 .  Prenat-Fe Poly-Methfol-FA-DHA (VITAFOL ULTRA) 29-0.6-0.4-200 MG CAPS, Take 1 capsule by mouth daily before breakfast., Disp: 30 capsule, Rfl: 11 .  valACYclovir (VALTREX) 1000 MG tablet, Take 1 tablet (1,000 mg total) by mouth 2 (two) times daily., Disp: 30 tablet, Rfl: prn Allergies  Allergen Reactions  . Mold  Extract [Trichophyton] Other (See Comments)    Wheezing   . Other     Roaches cause wheezing     Social History  Substance Use Topics  . Smoking status: Former Smoker -- 0.20 packs/day    Types: Cigarettes    Quit date: 03/30/2014  . Smokeless tobacco: Never Used  . Alcohol Use: Yes     Comment: occasional    Family History  Problem Relation Age of Onset  . Arthritis Maternal Aunt   . Diabetes Maternal Uncle   . Hypertension Maternal Grandmother   . Cancer Maternal Grandfather     PROSTATE  . Asthma      neices and nephews  . Allergic rhinitis Brother   . Eczema      neices and nephews  . Eczema Daughter   . Diabetes Father        Review of Systems Constitutional: negative for weight loss Genitourinary:negative for abnormal menstrual periods and vaginal discharge   Objective:   BP 108/76 mmHg  Pulse 78  Temp(Src) 98.3 F (36.8 C)  Wt 226 lb (102.513 kg)   PE:      Alert and no distress      LUE:  Nexplanon insertion site C, D, I.  Rod palpated, intact, non tender.   Lab Review Urine pregnancy test Labs reviewed yes Radiologic studies reviewed  no    Assessment:    30 y.o., continuing Nexplanon, no contraindications.   AUB.  R/O Chlamydia  Plan:    All questions answered. Discussed healthy lifestyle modifications. Urine culture and sensitivity. Urine GC / Chlamydia probes    Meds ordered this encounter  Medications  . ibuprofen (ADVIL,MOTRIN) 800 MG tablet    Sig: Take 1 tablet (800 mg total) by mouth every 8 (eight) hours as needed.    Dispense:  30 tablet    Refill:  5  . levonorgestrel-ethinyl estradiol (SEASONALE,INTROVALE,JOLESSA) 0.15-0.03 MG tablet    Sig: Take 1 tablet by mouth daily.    Dispense:  1 Package    Refill:  4  . Prenat-Fe Poly-Methfol-FA-DHA (VITAFOL ULTRA) 29-0.6-0.4-200 MG CAPS    Sig: Take 1 capsule by mouth daily before breakfast.    Dispense:  30 capsule    Refill:  11  . valACYclovir (VALTREX) 1000 MG tablet     Sig: Take 1 tablet (1,000 mg total) by mouth 2 (two) times daily.    Dispense:  30 tablet    Refill:  prn   No orders of the defined types were placed in this encounter.

## 2015-09-22 ENCOUNTER — Telehealth: Payer: Self-pay | Admitting: *Deleted

## 2015-09-22 NOTE — Telephone Encounter (Signed)
Patient was calling about a referral she was supposed to get. She has not been contacted.

## 2015-09-30 ENCOUNTER — Telehealth: Payer: Self-pay | Admitting: *Deleted

## 2015-09-30 NOTE — Telephone Encounter (Signed)
Pt called to office inquiring about Referral that was to be sent by Dr Clearance Coots. Previous message has been sent to Dr Clearance Coots. Attempt to contact pt. Left message on voicemail making pt aware that a message about referral has been sent to Dr Clearance Coots.  Will contact pt once he has responded. Advised to call office if any other concerns.

## 2015-10-03 ENCOUNTER — Other Ambulatory Visit: Payer: Self-pay | Admitting: Obstetrics

## 2015-10-06 ENCOUNTER — Encounter (HOSPITAL_COMMUNITY): Payer: Self-pay | Admitting: Family Medicine

## 2015-10-06 ENCOUNTER — Emergency Department (HOSPITAL_COMMUNITY): Payer: Medicaid Other

## 2015-10-06 ENCOUNTER — Emergency Department (HOSPITAL_COMMUNITY)
Admission: EM | Admit: 2015-10-06 | Discharge: 2015-10-06 | Disposition: A | Discharge status: Home / Self Care | Payer: Medicaid Other | Attending: Emergency Medicine | Admitting: Emergency Medicine

## 2015-10-06 DIAGNOSIS — Y9289 Other specified places as the place of occurrence of the external cause: Secondary | ICD-10-CM | POA: Diagnosis not present

## 2015-10-06 DIAGNOSIS — S62398A Other fracture of other metacarpal bone, initial encounter for closed fracture: Secondary | ICD-10-CM | POA: Diagnosis not present

## 2015-10-06 DIAGNOSIS — Z8619 Personal history of other infectious and parasitic diseases: Secondary | ICD-10-CM | POA: Diagnosis not present

## 2015-10-06 DIAGNOSIS — Y998 Other external cause status: Secondary | ICD-10-CM | POA: Diagnosis not present

## 2015-10-06 DIAGNOSIS — S62309A Unspecified fracture of unspecified metacarpal bone, initial encounter for closed fracture: Secondary | ICD-10-CM

## 2015-10-06 DIAGNOSIS — Z79899 Other long term (current) drug therapy: Secondary | ICD-10-CM | POA: Insufficient documentation

## 2015-10-06 DIAGNOSIS — E669 Obesity, unspecified: Secondary | ICD-10-CM | POA: Diagnosis not present

## 2015-10-06 DIAGNOSIS — Z8742 Personal history of other diseases of the female genital tract: Secondary | ICD-10-CM | POA: Diagnosis not present

## 2015-10-06 DIAGNOSIS — Z79818 Long term (current) use of other agents affecting estrogen receptors and estrogen levels: Secondary | ICD-10-CM | POA: Insufficient documentation

## 2015-10-06 DIAGNOSIS — Z87891 Personal history of nicotine dependence: Secondary | ICD-10-CM | POA: Insufficient documentation

## 2015-10-06 DIAGNOSIS — M79641 Pain in right hand: Secondary | ICD-10-CM

## 2015-10-06 DIAGNOSIS — Z872 Personal history of diseases of the skin and subcutaneous tissue: Secondary | ICD-10-CM | POA: Insufficient documentation

## 2015-10-06 DIAGNOSIS — S60221A Contusion of right hand, initial encounter: Secondary | ICD-10-CM | POA: Insufficient documentation

## 2015-10-06 DIAGNOSIS — W2209XA Striking against other stationary object, initial encounter: Secondary | ICD-10-CM | POA: Diagnosis not present

## 2015-10-06 DIAGNOSIS — Y9389 Activity, other specified: Secondary | ICD-10-CM | POA: Diagnosis not present

## 2015-10-06 DIAGNOSIS — S6991XA Unspecified injury of right wrist, hand and finger(s), initial encounter: Secondary | ICD-10-CM | POA: Diagnosis present

## 2015-10-06 DIAGNOSIS — J45909 Unspecified asthma, uncomplicated: Secondary | ICD-10-CM | POA: Insufficient documentation

## 2015-10-06 DIAGNOSIS — R202 Paresthesia of skin: Secondary | ICD-10-CM

## 2015-10-06 DIAGNOSIS — S6291XA Unspecified fracture of right wrist and hand, initial encounter for closed fracture: Secondary | ICD-10-CM

## 2015-10-06 MED ORDER — HYDROCODONE-ACETAMINOPHEN 5-325 MG PO TABS
1.0000 | ORAL_TABLET | Freq: Once | ORAL | Status: AC
  Administered 2015-10-06: 1 via ORAL
  Filled 2015-10-06: qty 1

## 2015-10-06 MED ORDER — HYDROCODONE-ACETAMINOPHEN 5-325 MG PO TABS: 1.0000 | ORAL_TABLET | Freq: Four times a day (QID) | ORAL | Status: DC | PRN

## 2015-10-06 MED ORDER — NAPROXEN 500 MG PO TABS: 500.0000 mg | ORAL_TABLET | Freq: Two times a day (BID) | ORAL | Status: DC | PRN

## 2015-10-06 NOTE — ED Notes (Signed)
Ortho tech made aware of splint order. 

## 2015-10-06 NOTE — Progress Notes (Signed)
Orthopedic Tech Progress Note Patient Details:  Gwendolyn Clay 1985/08/28 [id number]  Ortho Devices Type of Ortho Device: Ace wrap, Ulna gutter splint Ortho Device/Splint Location: ruie Ortho Device/Splint Interventions: Application   Lamekia Nolden 10/06/2015, 1:16 PM

## 2015-10-06 NOTE — ED Notes (Signed)
Ortho in room

## 2015-10-06 NOTE — ED Notes (Signed)
Patient transported to X-ray 

## 2015-10-06 NOTE — ED Notes (Signed)
Pt here for right wrist and hand pain. sts she hit her hand on the wall.

## 2015-10-06 NOTE — ED Notes (Signed)
PT rates pain 10/10 and adds, "I think I'm just focusing on it. I'll go home and go to sleep and it will be fine. "

## 2015-10-06 NOTE — ED Provider Notes (Signed)
CSN: [id number]     Arrival date & time 10/06/15  1102 History   First MD Initiated Contact with Patient 10/06/15 1147     Chief Complaint  Patient presents with  . Wrist Pain  . Hand Pain     (Consider location/radiation/quality/duration/timing/severity/associated sxs/prior Treatment) HPI Comments: Gwendolyn Clay is a 30 y.o. female with a PMHx of hypothyroidism, dysmenorrhea, eczema, allergic rhinitis, and asthma, who presents to the ED with complaints of right hand and wrist pain 1 day after striking a wall medial aspect of her hand around 7 AM yesterday while she was frustrated getting the kids ready for school. She reports the pain is 10/10 constant sharp nonradiating right hand and wrist pain, worse with finger movement, and improved with Naprosyn and ice. Associated symptoms include swelling, bruising, and some tingling in the right hand. She is left-handed. She denies any cuts, redness, warmth, numbness, weakness, loss of range of motion in the fingers, arm pain, other injuries, fevers, chills, chest pain, shortness breath, abdominal pain, nausea, vomiting, diarrhea, constipation, dysuria, or hematuria. No other complaints today. Currently on her menses, NKDA, and has a ride home.  Patient is a 29 y.o. female presenting with wrist pain and hand pain. The history is provided by the patient. No language interpreter was used.  Wrist Pain This is a new problem. The current episode started yesterday. The problem occurs constantly. The problem has been unchanged. Associated symptoms include arthralgias (R hand/wrist) and joint swelling. Pertinent negatives include no abdominal pain, chest pain, chills, fever, myalgias, nausea, numbness, urinary symptoms, vomiting or weakness. Exacerbated by: hand movement. She has tried NSAIDs and ice for the symptoms. The treatment provided mild relief.  Hand Pain Associated symptoms include arthralgias (R hand/wrist) and joint swelling. Pertinent negatives  include no abdominal pain, chest pain, chills, fever, myalgias, nausea, numbness, urinary symptoms, vomiting or weakness.    Past Medical History  Diagnosis Date  . Hypothyroidism   . History of chicken pox   . Dysmenorrhea   . Obese   . Bacterial infection   . Trichomonas   . Gonorrhea   . Asthma   . Allergic rhinitis     adenoidectomy  . Eczema    Past Surgical History  Procedure Laterality Date  . Adenoidectomy    . Wisdom tooth extraction     Family History  Problem Relation Age of Onset  . Arthritis Maternal Aunt   . Diabetes Maternal Uncle   . Hypertension Maternal Grandmother   . Cancer Maternal Grandfather     PROSTATE  . Asthma      neices and nephews  . Allergic rhinitis Brother   . Eczema      neices and nephews  . Eczema Daughter   . Diabetes Father    Social History  Substance Use Topics  . Smoking status: Former Smoker -- 0.20 packs/day    Types: Cigarettes    Quit date: 03/30/2014  . Smokeless tobacco: Never Used  . Alcohol Use: Yes     Comment: occasional   OB History    Gravida Para Term Preterm AB TAB SAB Ectopic Multiple Living   0 2     Review of Systems  Constitutional: Negative for fever and chills.  Respiratory: Negative for shortness of breath.   Cardiovascular: Negative for chest pain.  Gastrointestinal: Negative for nausea, vomiting, abdominal pain, diarrhea and constipation.  Genitourinary: Negative for dysuria and hematuria.  Musculoskeletal: Positive for  joint swelling and arthralgias (R hand/wrist). Negative for myalgias.  Skin: Positive for color change (bruising). Negative for wound.  Allergic/Immunologic: Negative for immunocompromised state.  Neurological: Negative for weakness and numbness.       +tingling in R hand/fingers  Psychiatric/Behavioral: Negative for confusion.   10 Systems reviewed and are negative for acute change except as noted in the HPI.    Allergies  Mold extract and Other  Home  Medications   Prior to Admission medications   Medication Sig Start Date End Date Taking? Authorizing Provider  ibuprofen (ADVIL,MOTRIN) 800 MG tablet Take 1 tablet (800 mg total) by mouth every 8 (eight) hours as needed. 09/13/15   Brock Bad, MD  levonorgestrel-ethinyl estradiol (SEASONALE,INTROVALE,JOLESSA) 0.15-0.03 MG tablet Take 1 tablet by mouth daily. 09/13/15   Brock Bad, MD  levothyroxine (SYNTHROID) 175 MCG tablet Take 1 tablet (175 mcg total) by mouth daily before breakfast. Patient not taking: Reported on 09/13/2015 04/13/15   Brock Bad, MD  Prenat-Fe Poly-Methfol-FA-DHA (VITAFOL ULTRA) 29-0.6-0.4-200 MG CAPS Take 1 capsule by mouth daily before breakfast. 09/13/15   Brock Bad, MD  valACYclovir (VALTREX) 1000 MG tablet Take 1 tablet (1,000 mg total) by mouth 2 (two) times daily. 09/13/15   Brock Bad, MD   BP 98/80 mmHg  Pulse 78  Temp(Src) 98 F (36.7 C) (Oral)  Resp 20  SpO2 100% Physical Exam  Constitutional: She is oriented to person, place, and time. Vital signs are normal. She appears well-developed and well-nourished.  Non-toxic appearance. No distress.  Afebrile, nontoxic, NAD  HENT:  Head: Normocephalic and atraumatic.  Mouth/Throat: Mucous membranes are normal.  Eyes: Conjunctivae and EOM are normal. Right eye exhibits no discharge. Left eye exhibits no discharge.  Neck: Normal range of motion. Neck supple.  Cardiovascular: Normal rate and intact distal pulses.   Pulmonary/Chest: Effort normal. No respiratory distress.  Abdominal: Normal appearance. She exhibits no distension.  Musculoskeletal: Normal range of motion.       Right hand: She exhibits tenderness, bony tenderness and swelling. She exhibits normal range of motion, normal two-point discrimination, normal capillary refill, no deformity and no laceration. Normal sensation noted. Normal strength noted.       Hands: R wrist and with FROM intact in all digits and joints, with TTP  to base of 5th metacarpal extending in towards the wrist, no focal crepitus, no deformity but significant swelling, some bruising, skin intact without scrapes or cuts to indicate fight-bite. Grip strength mildly diminished due to pain, but still intact. Sensation grossly intact in all fingers including with 2-point discrimination. Cap refill brisk and present, distal pulses intact, soft compartments.   Neurological: She is alert and oriented to person, place, and time. She has normal strength. No sensory deficit.  Skin: Skin is warm, dry and intact. No rash noted.  Psychiatric: She has a normal mood and affect. Her behavior is normal.  Nursing note and vitals reviewed.   ED Course  Procedures (including critical care time)  SPLINT APPLICATION Date/Time: 12:56 PM Authorized by: Ramond Marrow Consent: Verbal consent obtained. Risks and benefits: risks, benefits and alternatives were discussed Consent given by: patient Splint applied by: orthopedic technician Location details: R arm Splint type: ulnar gutter Supplies used: orthoglass Post-procedure: The splinted body part was neurovascularly unchanged following the procedure. Patient tolerance: Patient tolerated the procedure well with no immediate complications.    Labs Review Labs Reviewed - No data to display  Imaging Review Dg Wrist Complete  Right  10/06/2015  CLINICAL DATA:  Struck a wall today. EXAM: RIGHT WRIST - COMPLETE 3+ VIEW COMPARISON:  None. FINDINGS: There is a comminuted intra-articular impacted fracture involving the base of the fifth metacarpal. The other metacarpals are intact. The carpal bones appear normal. IMPRESSION: Comminuted intra-articular fracture involving the base of the fifth metacarpal. No other definite fractures. Electronically Signed   By: Rudie Meyer M.D.   On: 10/06/2015 12:08   Dg Hand Complete Right  10/06/2015  CLINICAL DATA:  Hit wall with right hand. Pain and swelling. Initial  encounter. EXAM: RIGHT HAND - COMPLETE 3+ VIEW COMPARISON:  None. FINDINGS: Fifth metacarpal base transverse fracture which appears extra-articular. Mild angulation from ventral impaction. No dislocation. Dorsal hand soft tissue swelling. IMPRESSION: Fifth metacarpal base fracture with ventral impaction. Electronically Signed   By: Marnee Spring M.D.   On: 10/06/2015 12:05   I have personally reviewed and evaluated these images and lab results as part of my medical decision-making.   EKG Interpretation None      MDM   Final diagnoses:  Right hand pain  Hand contusion, right, initial encounter  Hand fracture, right, closed, initial encounter  Metacarpal bone fracture, closed, initial encounter  Paresthesias in right hand    30 y.o. female here with R hand pain/swelling/bruising after hitting the medial aspect against a wall in frustration yesterday. +Swelling, +bruising, +tenderness to base of 5th metacarpal into the medial wrist. NVI with soft compartments. Grip strength mildly diminished due to pain, FROM intact in all digits. Xrays pending, on my review it appears that she has a 5th metacarpal fx at the base, ?angulation. Will await radiology reading, will give pain meds and reassess shortly.   12:14 PM Xrays show 5th metacarpal comminuted intraarticular fx with ventral impaction, mild angulation, without dislocation. Will consult hand specialist to ensure she has close f/up and that she does not need urgent intervention given the intra-articular nature of the fx, and the impaction/angulation. Will reassess shortly.  12:42 PM Dr. Betha Loa returning page, states she can be followed up outpt, place ulnar gutter splint and have her call the office. RICE discussed. Will give pain meds. Doubt need for abx given the skin is closed and no evidence of fight-bite. F/up with Dr. Merlyn Lot in the office on Monday. I explained the diagnosis and have given explicit precautions to return to the ER  including for any other new or worsening symptoms. The patient understands and accepts the medical plan as it's been dictated and I have answered their questions. Discharge instructions concerning home care and prescriptions have been given. The patient is STABLE and is discharged to home in good condition.  BP 98/80 mmHg  Pulse 78  Temp(Src) 98 F (36.7 C) (Oral)  Resp 20  SpO2 100%  LMP 10/06/2015  Meds ordered this encounter  Medications  . HYDROcodone-acetaminophen (NORCO/VICODIN) 5-325 MG per tablet 1 tablet    Sig:   . HYDROcodone-acetaminophen (NORCO) 5-325 MG tablet    Sig: Take 1-2 tablets by mouth every 6 (six) hours as needed for severe pain.    Dispense:  20 tablet    Refill:  0    Order Specific Question:  Supervising Provider    Answer:  Donnetta Hutching [[id number]]  . naproxen (NAPROSYN) 500 MG tablet    Sig: Take 1 tablet (500 mg total) by mouth 2 (two) times daily as needed for mild pain, moderate pain or headache (TAKE WITH MEALS.).    Dispense:  20  tablet    Refill:  0    Order Specific Question:  Supervising Provider    Answer:  Donnetta Hutching [[id number]]       Damoney Julia Camprubi-Soms, PA-C 10/06/15 1257  Donnetta Hutching, MD 10/08/15 [phone]

## 2015-10-06 NOTE — Discharge Instructions (Signed)
Wear arm splint at all times until you see the hand specialist. Ice and elevate hand/wrist throughout the day. Alternate between naprosyn and norco for pain relief. Do not drive or operate machinery with pain medication use. Call hand specialist follow up today to schedule followup appointment for early next week (Monday or Tuesday) for ongoing management of your hand fracture. Return to the ER for changes or worsening symptoms.   Metacarpal Fracture Fractures of metacarpals are breaks in the bones of the hand. They extend from the knuckles to the wrist. These bones can break in many ways. There are different ways of treating these fractures. HOME CARE  Only exercise as told by your doctor.  Return to activities as told by your doctor.  Go to physical therapy as told by your doctor.  Follow your doctor's advice about driving.  Keep the injured hand raised (elevated) above the level of your heart.  If a plaster, fiberglass, or pre-formed splint was applied:  Wear your splint as told and until you are examined again.  Apply ice on the injury for 15-20 minutes at a time, 03-04 times a day. Put the ice in a plastic bag. Place a towel between your skin and the bag.  Do not get your splint or cast wet. Protect it during bathing with a plastic bag.  Loosen the elastic bandage around the splint if your fingers start to get numb, tingle, get cold, or turn blue.  If the splint is plaster, do not lean it on hard surfaces or put pressure on it for 24 hours after it is put on.  Do not  try to scratch the skin under the cast.  Check the skin around the cast every day. You may put lotion on red or sore areas.  Move the fingers of your casted hand several times a day.  Only take medicine as told by your doctor.  Follow up as told by your doctor. This is very important in order to avoid permanent injury, disability, or lasting (chronic) pain. GET HELP RIGHT AWAY IF:   You develop a  rash.  You have problems breathing.  You have any allergy problems.  You have more than a small spot of blood from beneath your cast or splint.  You have redness, puffiness (swelling), or more pain from beneath your cast or splint.  Yellowish-white fluid (pus) comes from beneath your cast or splint.  You develop a temperature by mouth above 102 F (38.9 C), not controlled by medicine.  You have a bad smell coming from under your cast or splint.  You have problems moving any of your fingers. If you do not have a window in your cast for looking at the wound, a fluid or a little bleeding may show up as a stain on the outside of your cast. Tell your doctor about any stains you see. MAKE SURE YOU:   Understand these instructions.  Will watch your condition.  Will get help right away if you are not doing well or get worse.   This information is not intended to replace advice given to you by your health care provider. Make sure you discuss any questions you have with your health care provider.   Document Released: 01/16/2008 Document Revised: 08/20/2014 Document Reviewed: 05/19/2014 Elsevier Interactive Patient Education 2016 [employer]  Paresthesia Paresthesia is a burning or prickling feeling. This feeling can happen in any part of the body. It often happens in the hands, arms, legs, or feet. Usually,  it is not painful. In most cases, the feeling goes away in a short time and is not a sign of a serious problem. HOME CARE  Avoid drinking alcohol.  Try massage or needle therapy (acupuncture) to help with your problems.  Keep all follow-up visits as told by your doctor. This is important. GET HELP IF:  You keep on having episodes of paresthesia.  Your burning or prickling feeling gets worse when you walk.  You have pain or cramps.  You feel dizzy.  You have a rash. GET HELP RIGHT AWAY IF:  You feel weak.  You have trouble walking or moving.  You have problems  speaking, understanding, or seeing.  You feel confused.  You cannot control when you pee (urinate) or poop (bowel movement).  You lose feeling (numbness) after an injury.  You pass out (faint).   This information is not intended to replace advice given to you by your health care provider. Make sure you discuss any questions you have with your health care provider.   Document Released: 07/12/2008 Document Revised: 12/14/2014 Document Reviewed: 07/26/2014 Elsevier Interactive Patient Education 2016 [employer]  Hand Contusion  A hand contusion is a deep bruise to the hand. Contusions happen when an injury causes bleeding under the skin. Signs of bruising include pain, puffiness (swelling), and discolored skin. The contusion may turn blue, purple, or yellow. HOME CARE  Put ice on the injured area.  Put ice in a plastic bag.  Place a towel between your skin and the bag.  Leave the ice on for 15-20 minutes, 03-04 times a day.  Only take medicines as told by your doctor.  Use an elastic wrap only as told. You may remove the wrap for sleeping, showering, and bathing. Take the wrap off if you lose feeling (have numbness) in your fingers, or they turn blue or cold. Put the wrap on more loosely.  Keep the hand raised (elevated) with pillows.  Avoid using your hand too much if it is painful. GET HELP RIGHT AWAY IF:   You have more redness, puffiness, or pain in your hand.  Your puffiness or pain does not get better with medicine.  You lose feeling in your hand, or you cannot move your fingers.  Your hand turns cold or blue.  You have pain when you move your fingers.  Your hand feels warm.  Your contusion does not get better in 2 days. MAKE SURE YOU:   Understand these instructions.  Will watch this condition.  Will get help right away if you are not doing well or you get worse.   This information is not intended to replace advice given to you by your health care  provider. Make sure you discuss any questions you have with your health care provider.   Document Released: 01/16/2008 Document Revised: 08/20/2014 Document Reviewed: 01/21/2012 Elsevier Interactive Patient Education 2016 [employer]  Cryotherapy Cryotherapy means treatment with cold. Ice or gel packs can be used to reduce both pain and swelling. Ice is the most helpful within the first 24 to 48 hours after an injury or flare-up from overusing a muscle or joint. Sprains, strains, spasms, burning pain, shooting pain, and aches can all be eased with ice. Ice can also be used when recovering from surgery. Ice is effective, has very few side effects, and is safe for most people to use. PRECAUTIONS  Ice is not a safe treatment option for people with:  Raynaud phenomenon. This is a condition affecting  small blood vessels in the extremities. Exposure to cold may cause your problems to return.  Cold hypersensitivity. There are many forms of cold hypersensitivity, including:  Cold urticaria. Red, itchy hives appear on the skin when the tissues begin to warm after being iced.  Cold erythema. This is a red, itchy rash caused by exposure to cold.  Cold hemoglobinuria. Red blood cells break down when the tissues begin to warm after being iced. The hemoglobin that carry oxygen are passed into the urine because they cannot combine with blood proteins fast enough.  Numbness or altered sensitivity in the area being iced. If you have any of the following conditions, do not use ice until you have discussed cryotherapy with your caregiver:  Heart conditions, such as arrhythmia, angina, or chronic heart disease.  High blood pressure.  Healing wounds or open skin in the area being iced.  Current infections.  Rheumatoid arthritis.  Poor circulation.  Diabetes. Ice slows the blood flow in the region it is applied. This is beneficial when trying to stop inflamed tissues from spreading irritating  chemicals to surrounding tissues. However, if you expose your skin to cold temperatures for too long or without the proper protection, you can damage your skin or nerves. Watch for signs of skin damage due to cold. HOME CARE INSTRUCTIONS Follow these tips to use ice and cold packs safely.  Place a dry or damp towel between the ice and skin. A damp towel will cool the skin more quickly, so you may need to shorten the time that the ice is used.  For a more rapid response, add gentle compression to the ice.  Ice for no more than 10 to 20 minutes at a time. The bonier the area you are icing, the less time it will take to get the benefits of ice.  Check your skin after 5 minutes to make sure there are no signs of a poor response to cold or skin damage.  Rest 20 minutes or more between uses.  Once your skin is numb, you can end your treatment. You can test numbness by very lightly touching your skin. The touch should be so light that you do not see the skin dimple from the pressure of your fingertip. When using ice, most people will feel these normal sensations in this order: cold, burning, aching, and numbness.  Do not use ice on someone who cannot communicate their responses to pain, such as small children or people with dementia. HOW TO MAKE AN ICE PACK Ice packs are the most common way to use ice therapy. Other methods include ice massage, ice baths, and cryosprays. Muscle creams that cause a cold, tingly feeling do not offer the same benefits that ice offers and should not be used as a substitute unless recommended by your caregiver. To make an ice pack, do one of the following:  Place crushed ice or a bag of frozen vegetables in a sealable plastic bag. Squeeze out the excess air. Place this bag inside another plastic bag. Slide the bag into a pillowcase or place a damp towel between your skin and the bag.  Mix 3 parts water with 1 part rubbing alcohol. Freeze the mixture in a sealable plastic  bag. When you remove the mixture from the freezer, it will be slushy. Squeeze out the excess air. Place this bag inside another plastic bag. Slide the bag into a pillowcase or place a damp towel between your skin and the bag. SEEK MEDICAL CARE IF:  You develop white spots on your skin. This may give the skin a blotchy (mottled) appearance.  Your skin turns blue or pale.  Your skin becomes waxy or hard.  Your swelling gets worse. MAKE SURE YOU:   Understand these instructions.  Will watch your condition.  Will get help right away if you are not doing well or get worse.   This information is not intended to replace advice given to you by your health care provider. Make sure you discuss any questions you have with your health care provider.   Document Released: 03/26/2011 Document Revised: 08/20/2014 Document Reviewed: 03/26/2011 Elsevier Interactive Patient Education 2016 [employer]  Cast or Splint Care Casts and splints support injured limbs and keep bones from moving while they heal.  HOME CARE  Keep the cast or splint uncovered during the drying period.  A plaster cast can take 24 to 48 hours to dry.  A fiberglass cast will dry in less than 1 hour.  Do not rest the cast on anything harder than a pillow for 24 hours.  Do not put weight on your injured limb. Do not put pressure on the cast. Wait for your doctor's approval.  Keep the cast or splint dry.  Cover the cast or splint with a plastic bag during baths or wet weather.  If you have a cast over your chest and belly (trunk), take sponge baths until the cast is taken off.  If your cast gets wet, dry it with a towel or blow dryer. Use the cool setting on the blow dryer.  Keep your cast or splint clean. Wash a dirty cast with a damp cloth.  Do not put any objects under your cast or splint.  Do not scratch the skin under the cast with an object. If itching is a problem, use a blow dryer on a cool setting over the  itchy area.  Do not trim or cut your cast.  Do not take out the padding from inside your cast.  Exercise your joints near the cast as told by your doctor.  Raise (elevate) your injured limb on 1 or 2 pillows for the first 1 to 3 days. GET HELP IF:  Your cast or splint cracks.  Your cast or splint is too tight or too loose.  You itch badly under the cast.  Your cast gets wet or has a soft spot.  You have a bad smell coming from the cast.  You get an object stuck under the cast.  Your skin around the cast becomes red or sore.  You have new or more pain after the cast is put on. GET HELP RIGHT AWAY IF:  You have fluid leaking through the cast.  You cannot move your fingers or toes.  Your fingers or toes turn blue or white or are cool, painful, or puffy (swollen).  You have tingling or lose feeling (numbness) around the injured area.  You have bad pain or pressure under the cast.  You have trouble breathing or have shortness of breath.  You have chest pain.   This information is not intended to replace advice given to you by your health care provider. Make sure you discuss any questions you have with your health care provider.   Document Released: 11/29/2010 Document Revised: 04/01/2013 Document Reviewed: 02/05/2013 Elsevier Interactive Patient Education [employer]

## 2015-10-12 ENCOUNTER — Other Ambulatory Visit: Payer: Self-pay | Admitting: Orthopedic Surgery

## 2015-10-12 ENCOUNTER — Encounter (HOSPITAL_BASED_OUTPATIENT_CLINIC_OR_DEPARTMENT_OTHER): Payer: Self-pay | Admitting: *Deleted

## 2015-10-13 ENCOUNTER — Ambulatory Visit (HOSPITAL_BASED_OUTPATIENT_CLINIC_OR_DEPARTMENT_OTHER): Payer: Medicaid Other | Admitting: Anesthesiology

## 2015-10-13 ENCOUNTER — Encounter (HOSPITAL_BASED_OUTPATIENT_CLINIC_OR_DEPARTMENT_OTHER)
Admission: RE | Disposition: A | Discharge status: Home / Self Care | Payer: Self-pay | Source: Ambulatory Visit | Attending: Orthopedic Surgery

## 2015-10-13 ENCOUNTER — Ambulatory Visit (HOSPITAL_BASED_OUTPATIENT_CLINIC_OR_DEPARTMENT_OTHER)
Admission: RE | Admit: 2015-10-13 | Discharge: 2015-10-13 | Disposition: A | Discharge status: Home / Self Care | Payer: Medicaid Other | Source: Ambulatory Visit | Attending: Orthopedic Surgery | Admitting: Orthopedic Surgery

## 2015-10-13 ENCOUNTER — Encounter (HOSPITAL_BASED_OUTPATIENT_CLINIC_OR_DEPARTMENT_OTHER): Payer: Self-pay | Admitting: Anesthesiology

## 2015-10-13 DIAGNOSIS — Z6831 Body mass index (BMI) 31.0-31.9, adult: Secondary | ICD-10-CM | POA: Insufficient documentation

## 2015-10-13 DIAGNOSIS — X58XXXA Exposure to other specified factors, initial encounter: Secondary | ICD-10-CM | POA: Diagnosis not present

## 2015-10-13 DIAGNOSIS — S62316A Displaced fracture of base of fifth metacarpal bone, right hand, initial encounter for closed fracture: Secondary | ICD-10-CM | POA: Diagnosis present

## 2015-10-13 DIAGNOSIS — Z793 Long term (current) use of hormonal contraceptives: Secondary | ICD-10-CM | POA: Insufficient documentation

## 2015-10-13 DIAGNOSIS — W2201XA Walked into wall, initial encounter: Secondary | ICD-10-CM | POA: Diagnosis not present

## 2015-10-13 DIAGNOSIS — J45909 Unspecified asthma, uncomplicated: Secondary | ICD-10-CM | POA: Insufficient documentation

## 2015-10-13 DIAGNOSIS — E039 Hypothyroidism, unspecified: Secondary | ICD-10-CM | POA: Insufficient documentation

## 2015-10-13 DIAGNOSIS — F17210 Nicotine dependence, cigarettes, uncomplicated: Secondary | ICD-10-CM | POA: Insufficient documentation

## 2015-10-13 DIAGNOSIS — Z79899 Other long term (current) drug therapy: Secondary | ICD-10-CM | POA: Diagnosis not present

## 2015-10-13 HISTORY — PX: CLOSED REDUCTION METACARPAL WITH PERCUTANEOUS PINNING: SHX5613

## 2015-10-13 SURGERY — CLOSED REDUCTION, FRACTURE, METACARPAL BONE, WITH PERCUTANEOUS PINNING
Anesthesia: General | Site: Finger | Laterality: Right

## 2015-10-13 MED ORDER — HYDROCODONE-ACETAMINOPHEN 5-325 MG PO TABS: ORAL_TABLET | ORAL | Status: DC

## 2015-10-13 MED ORDER — DEXAMETHASONE SODIUM PHOSPHATE 10 MG/ML IJ SOLN
INTRAMUSCULAR | Status: DC | PRN
  Administered 2015-10-13: 10 mg via INTRAVENOUS

## 2015-10-13 MED ORDER — MIDAZOLAM HCL 2 MG/2ML IJ SOLN
1.0000 mg | INTRAMUSCULAR | Status: DC | PRN
  Administered 2015-10-13: 2 mg via INTRAVENOUS

## 2015-10-13 MED ORDER — FENTANYL CITRATE (PF) 100 MCG/2ML IJ SOLN
INTRAMUSCULAR | Status: AC
  Filled 2015-10-13: qty 2

## 2015-10-13 MED ORDER — HYDROMORPHONE HCL 1 MG/ML IJ SOLN
0.5000 mg | INTRAMUSCULAR | Status: DC | PRN
  Administered 2015-10-13 (×3): 0.5 mg via INTRAVENOUS

## 2015-10-13 MED ORDER — PROPOFOL 10 MG/ML IV BOLUS
INTRAVENOUS | Status: DC | PRN
  Administered 2015-10-13: 200 mg via INTRAVENOUS

## 2015-10-13 MED ORDER — HYDROMORPHONE HCL 1 MG/ML IJ SOLN
INTRAMUSCULAR | Status: AC
  Filled 2015-10-13: qty 1

## 2015-10-13 MED ORDER — PROMETHAZINE HCL 25 MG/ML IJ SOLN: 6.2500 mg | INTRAMUSCULAR | Status: DC | PRN

## 2015-10-13 MED ORDER — HYDROMORPHONE HCL 1 MG/ML IJ SOLN
0.2500 mg | INTRAMUSCULAR | Status: DC | PRN
  Administered 2015-10-13 (×4): 0.5 mg via INTRAVENOUS

## 2015-10-13 MED ORDER — LIDOCAINE HCL (CARDIAC) 20 MG/ML IV SOLN
INTRAVENOUS | Status: AC
  Filled 2015-10-13: qty 5

## 2015-10-13 MED ORDER — CHLORHEXIDINE GLUCONATE 4 % EX LIQD: 60.0000 mL | Freq: Once | CUTANEOUS | Status: DC

## 2015-10-13 MED ORDER — LACTATED RINGERS IV SOLN
INTRAVENOUS | Status: DC
  Administered 2015-10-13: 13:00:00 via INTRAVENOUS
  Administered 2015-10-13: 10 mL/h via INTRAVENOUS

## 2015-10-13 MED ORDER — KETOROLAC TROMETHAMINE 30 MG/ML IJ SOLN
INTRAMUSCULAR | Status: AC
  Filled 2015-10-13: qty 1

## 2015-10-13 MED ORDER — KETOROLAC TROMETHAMINE 30 MG/ML IJ SOLN
INTRAMUSCULAR | Status: DC | PRN
  Administered 2015-10-13: 30 mg via INTRAVENOUS

## 2015-10-13 MED ORDER — OXYCODONE HCL 5 MG/5ML PO SOLN: 5.0000 mg | Freq: Once | ORAL | Status: AC | PRN

## 2015-10-13 MED ORDER — OXYCODONE HCL 5 MG PO TABS
ORAL_TABLET | ORAL | Status: AC
  Filled 2015-10-13: qty 1

## 2015-10-13 MED ORDER — MIDAZOLAM HCL 2 MG/2ML IJ SOLN
INTRAMUSCULAR | Status: AC
Start: 2015-10-13 — End: 2015-10-13
  Filled 2015-10-13: qty 2

## 2015-10-13 MED ORDER — FENTANYL CITRATE (PF) 100 MCG/2ML IJ SOLN
50.0000 ug | INTRAMUSCULAR | Status: DC | PRN
  Administered 2015-10-13: 100 ug via INTRAVENOUS
  Administered 2015-10-13: 50 ug via INTRAVENOUS

## 2015-10-13 MED ORDER — GLYCOPYRROLATE 0.2 MG/ML IJ SOLN: 0.2000 mg | Freq: Once | INTRAMUSCULAR | Status: DC | PRN

## 2015-10-13 MED ORDER — DEXAMETHASONE SODIUM PHOSPHATE 4 MG/ML IJ SOLN
INTRAMUSCULAR | Status: DC | PRN
  Administered 2015-10-13: 10 mg via INTRAVENOUS

## 2015-10-13 MED ORDER — OXYCODONE HCL 5 MG PO TABS
5.0000 mg | ORAL_TABLET | Freq: Once | ORAL | Status: AC | PRN
  Administered 2015-10-13: 5 mg via ORAL

## 2015-10-13 MED ORDER — DEXAMETHASONE SODIUM PHOSPHATE 10 MG/ML IJ SOLN
INTRAMUSCULAR | Status: AC
  Filled 2015-10-13: qty 1

## 2015-10-13 MED ORDER — ONDANSETRON HCL 4 MG/2ML IJ SOLN
INTRAMUSCULAR | Status: DC | PRN
  Administered 2015-10-13: 4 mg via INTRAVENOUS

## 2015-10-13 MED ORDER — ONDANSETRON HCL 4 MG/2ML IJ SOLN
INTRAMUSCULAR | Status: AC
  Filled 2015-10-13: qty 2

## 2015-10-13 MED ORDER — CEFAZOLIN SODIUM-DEXTROSE 2-3 GM-% IV SOLR
2.0000 g | INTRAVENOUS | Status: AC
  Administered 2015-10-13: 2 g via INTRAVENOUS

## 2015-10-13 MED ORDER — MIDAZOLAM HCL 2 MG/2ML IJ SOLN
INTRAMUSCULAR | Status: AC
  Filled 2015-10-13: qty 2

## 2015-10-13 MED ORDER — BUPIVACAINE HCL (PF) 0.25 % IJ SOLN
INTRAMUSCULAR | Status: DC | PRN
  Administered 2015-10-13: 6 mL

## 2015-10-13 MED ORDER — SCOPOLAMINE 1 MG/3DAYS TD PT72: 1.0000 | MEDICATED_PATCH | Freq: Once | TRANSDERMAL | Status: DC | PRN

## 2015-10-13 MED ORDER — CEFAZOLIN SODIUM-DEXTROSE 2-3 GM-% IV SOLR
INTRAVENOUS | Status: AC
  Filled 2015-10-13: qty 50

## 2015-10-13 SURGICAL SUPPLY — 60 items
BANDAGE ACE 3X5.8 VEL STRL LF (GAUZE/BANDAGES/DRESSINGS) ×3 IMPLANT
BLADE MINI RND TIP GREEN BEAV (BLADE) IMPLANT
BLADE SURG 15 STRL LF DISP TIS (BLADE) ×2 IMPLANT
BLADE SURG 15 STRL SS (BLADE)
BNDG CMPR 9X4 STRL LF SNTH (GAUZE/BANDAGES/DRESSINGS) ×1
BNDG ELASTIC 2X5.8 VLCR STR LF (GAUZE/BANDAGES/DRESSINGS) IMPLANT
BNDG ESMARK 4X9 LF (GAUZE/BANDAGES/DRESSINGS) ×3 IMPLANT
BNDG GAUZE ELAST 4 BULKY (GAUZE/BANDAGES/DRESSINGS) ×3 IMPLANT
CHLORAPREP W/TINT 26ML (MISCELLANEOUS) ×3 IMPLANT
CORDS BIPOLAR (ELECTRODE) ×1 IMPLANT
COVER BACK TABLE 60X90IN (DRAPES) ×3 IMPLANT
COVER MAYO STAND STRL (DRAPES) ×3 IMPLANT
CUFF TOURNIQUET SINGLE 18IN (TOURNIQUET CUFF) ×3 IMPLANT
DRAPE EXTREMITY T 121X128X90 (DRAPE) ×3 IMPLANT
DRAPE OEC MINIVIEW 54X84 (DRAPES) ×3 IMPLANT
DRAPE SURG 17X23 STRL (DRAPES) ×3 IMPLANT
GAUZE SPONGE 4X4 12PLY STRL (GAUZE/BANDAGES/DRESSINGS) ×3 IMPLANT
GAUZE XEROFORM 1X8 LF (GAUZE/BANDAGES/DRESSINGS) ×3 IMPLANT
GLOVE BIO SURGEON STRL SZ7.5 (GLOVE) ×3 IMPLANT
GLOVE BIOGEL PI IND STRL 7.0 (GLOVE) IMPLANT
GLOVE BIOGEL PI IND STRL 7.5 (GLOVE) IMPLANT
GLOVE BIOGEL PI IND STRL 8 (GLOVE) ×1 IMPLANT
GLOVE BIOGEL PI INDICATOR 7.0 (GLOVE) ×6
GLOVE BIOGEL PI INDICATOR 7.5 (GLOVE) ×2
GLOVE BIOGEL PI INDICATOR 8 (GLOVE) ×2
GLOVE ECLIPSE 6.5 STRL STRAW (GLOVE) ×4 IMPLANT
GLOVE SKINSENSE NS SZ7.5 (GLOVE) ×2
GLOVE SKINSENSE STRL SZ7.5 (GLOVE) IMPLANT
GOWN STRL REUS W/ TWL LRG LVL3 (GOWN DISPOSABLE) ×1 IMPLANT
GOWN STRL REUS W/ TWL XL LVL3 (GOWN DISPOSABLE) ×1 IMPLANT
GOWN STRL REUS W/TWL LRG LVL3 (GOWN DISPOSABLE) ×9
GOWN STRL REUS W/TWL XL LVL3 (GOWN DISPOSABLE) ×3
K-WIRE .045X4 (WIRE) ×6 IMPLANT
NDL HYPO 25X1 1.5 SAFETY (NEEDLE) IMPLANT
NEEDLE HYPO 22GX1.5 SAFETY (NEEDLE) ×2 IMPLANT
NEEDLE HYPO 25X1 1.5 SAFETY (NEEDLE) IMPLANT
NS IRRIG 1000ML POUR BTL (IV SOLUTION) ×1 IMPLANT
PACK BASIN DAY SURGERY FS (CUSTOM PROCEDURE TRAY) ×3 IMPLANT
PAD CAST 3X4 CTTN HI CHSV (CAST SUPPLIES) ×1 IMPLANT
PAD CAST 4YDX4 CTTN HI CHSV (CAST SUPPLIES) IMPLANT
PADDING CAST ABS 4INX4YD NS (CAST SUPPLIES) ×2
PADDING CAST ABS COTTON 4X4 ST (CAST SUPPLIES) ×1 IMPLANT
PADDING CAST COTTON 3X4 STRL (CAST SUPPLIES) ×3
PADDING CAST COTTON 4X4 STRL (CAST SUPPLIES)
SLEEVE SCD COMPRESS KNEE MED (MISCELLANEOUS) IMPLANT
SPLINT PLASTER CAST XFAST 3X15 (CAST SUPPLIES) IMPLANT
SPLINT PLASTER CAST XFAST 4X15 (CAST SUPPLIES) IMPLANT
SPLINT PLASTER XTRA FAST SET 4 (CAST SUPPLIES)
SPLINT PLASTER XTRA FASTSET 3X (CAST SUPPLIES) ×40
STOCKINETTE 4X48 STRL (DRAPES) ×3 IMPLANT
SUT ETHILON 3 0 PS 1 (SUTURE) IMPLANT
SUT ETHILON 4 0 PS 2 18 (SUTURE) ×1 IMPLANT
SUT MERSILENE 4 0 P 3 (SUTURE) IMPLANT
SUT VIC AB 3-0 PS1 18 (SUTURE)
SUT VIC AB 3-0 PS1 18XBRD (SUTURE) IMPLANT
SUT VICRYL 4-0 PS2 18IN ABS (SUTURE) IMPLANT
SYR BULB 3OZ (MISCELLANEOUS) ×1 IMPLANT
SYR CONTROL 10ML LL (SYRINGE) ×2 IMPLANT
TOWEL OR 17X24 6PK STRL BLUE (TOWEL DISPOSABLE) ×4 IMPLANT
UNDERPAD 30X30 (UNDERPADS AND DIAPERS) ×1 IMPLANT

## 2015-10-13 NOTE — Discharge Instructions (Addendum)

## 2015-10-13 NOTE — Anesthesia Preprocedure Evaluation (Addendum)
Anesthesia Evaluation  Patient identified by MRN, date of birth, ID band Patient awake    Reviewed: Allergy & Precautions, H&P , Patient's Chart, lab work & pertinent test results  Airway Mallampati: II  TM Distance: >3 FB Neck ROM: full    Dental  (+) Teeth Intact   Pulmonary asthma , neg recent URI, Current Smoker, former smoker,    breath sounds clear to auscultation       Cardiovascular negative cardio ROS   Rhythm:regular Rate:Normal     Neuro/Psych    GI/Hepatic negative GI ROS, Neg liver ROS,   Endo/Other  Hypothyroidism Morbid obesity  Renal/GU negative Renal ROS     Musculoskeletal   Abdominal   Peds  Hematology   Anesthesia Other Findings       Reproductive/Obstetrics negative OB ROS                            Anesthesia Physical  Anesthesia Plan  ASA: II  Anesthesia Plan: General   Post-op Pain Management:    Induction: Intravenous  Airway Management Planned: LMA  Additional Equipment:   Intra-op Plan:   Post-operative Plan: Extubation in OR  Informed Consent: I have reviewed the patients History and Physical, chart, labs and discussed the procedure including the risks, benefits and alternatives for the proposed anesthesia with the patient or authorized representative who has indicated his/her understanding and acceptance.   Dental Advisory Given  Plan Discussed with: CRNA, Anesthesiologist and Surgeon  Anesthesia Plan Comments: (Pt discussed SCB for postoperative pain management, but she refused.)       Anesthesia Quick Evaluation

## 2015-10-13 NOTE — Anesthesia Postprocedure Evaluation (Signed)
Anesthesia Post Note  Patient: Gwendolyn Clay  Procedure(s) Performed: Procedure(s) (LRB): CLOSED REDUCTION PERCUTANEOUS PINNING RIGHT SMALL METACARPAL FRACTURE  (Right)  Patient location during evaluation: PACU Anesthesia Type: General Level of consciousness: awake and alert Pain management: pain level controlled Vital Signs Assessment: post-procedure vital signs reviewed and stable Respiratory status: spontaneous breathing, nonlabored ventilation and respiratory function stable Cardiovascular status: blood pressure returned to baseline and stable Postop Assessment: no signs of nausea or vomiting Anesthetic complications: no    Last Vitals:  Filed Vitals:   10/13/15 1415 10/13/15 1437  BP: 107/82 115/76  Pulse: 56 55  Temp:  36.9 C  Resp: 16 16    Last Pain:  Filed Vitals:   10/13/15 1439  PainSc: 3                  Marranda Arakelian A

## 2015-10-13 NOTE — Op Note (Deleted)
NAMEELLOISE, Gwendolyn Clay NO.:  [id number]  MEDICAL RECORD NO.:  [id number]  LOCATION:                               FACILITY:  [facility]  PHYSICIAN:  Betha Loa, MD        DATE OF BIRTH:  1986-08-09  DATE OF PROCEDURE:  10/13/2015 DATE OF DISCHARGE:                              OPERATIVE REPORT   PREOPERATIVE DIAGNOSIS:  Right small finger metacarpal base intra- articular fracture.  POSTOPERATIVE DIAGNOSIS:  Right small finger metacarpal base intra- articular fracture.  PROCEDURE:  Closed reduction percutaneous pinning of right small finger metacarpal base intra-articular fracture.  SURGEON:  Betha Loa, M.D.  ASSISTANT:  None.  ANESTHESIA:  General.  IV FLUIDS:  Per anesthesia flow sheet.  ESTIMATED BLOOD LOSS:  Minimal.  COMPLICATIONS:  None.  SPECIMENS:  None.  TOURNIQUET TIME:  Less than 30 minutes.  DISPOSITION:  Stable to PACU.  INDICATIONS:  Ms. Lacap is a 30 year old female, who last week hit her right hand on the wall in frustration, injuring the small finger metacarpal.  She was seen in the emergency department, where radiographs were taken reveal a small finger metacarpal base fracture.  She is splinted and follow up in the office.  We discussed the operative and nonoperative treatment options.  She initially wanted nonoperative treatment, but then decided she wanted operative fixation.  Risks, benefits, alternatives of surgery have been discussed including the risk of blood loss; infection; damage to nerves, vessels, tendons, ligaments, bone; failure of surgery; need for additional surgery; complications with wound healing, continued pain, nonunion, and stiffness.  She voiced understanding of these risks and elected to proceed.  OPERATIVE COURSE:  After being identified preoperatively by myself, the patient and I agreed upon procedure and site of procedure.  Surgical site was marked.  The risks, benefits, and alternatives of surgery  were reviewed and she wished to proceed.  Surgical consent had been signed. She was given IV Ancef as preoperative antibiotic prophylaxis.  She was transferred to the operating room and placed on the operating table in supine position with the right upper extremity on arm board.  General anesthesia was induced by Anesthesiology.  Right upper extremity was prepped and draped in normal sterile orthopedic fashion.  Surgical pause was performed between surgeons, anesthesia, operating staff, and all were in agreement as to the patient, procedure, and site of procedure. Tourniquet at the proximal aspect of the extremity was inflated to 250 mmHg after exsanguination of the limb with Esmarch bandage.  C-arm was used in AP, lateral, and oblique projections throughout the case to aid in reduction and positioning of hardware.  A closed reduction was performed.  Three 0.045 inch K-wires were used.  One was advanced across the metacarpal base across the fracture site into the base of the ring finger metacarpal.  Two were then advanced distal to the fracture across the small finger metacarpal into the ring finger metacarpal.  This was adequate to stabilize the fracture.  Good length had been restored to the metacarpal.  The wrist was placed through a tenodesis and there was no scissoring.  The pins were bent and cut short and the pin sites dressed with sterile  Xeroform, 4x4s, and wrapped with Kerlix bandage.  A volar and dorsal slab splint including the long, ring, and small fingers was placed with the MPs flexed and the IPs extended.  This was wrapped with Kerlix and Ace bandage.  Tourniquet was deflated at less than 30 minutes.  The fingertips were pink with brisk capillary refill after deflation of tourniquet.  Operative drapes were broken down.  The patient was awoken from anesthesia safely.  She was transferred back to stretcher and taken to PACU in stable condition.  I will see her back in the  office in 1 week for postoperative followup.  We will give her Norco 5/325, 1-2 p.o. q.6 hours p.r.n. pain, dispensed #40.     Betha Loa, MD   ______________________________ Betha Loa, MD    KK/MEDQ  D:  10/13/2015  T:  10/13/2015  Job:  [id number]

## 2015-10-13 NOTE — Transfer of Care (Signed)
Immediate Anesthesia Transfer of Care Note  Patient: Gwendolyn Clay  Procedure(s) Performed: Procedure(s): CLOSED REDUCTION PERCUTANEOUS PINNING RIGHT SMALL METACARPAL FRACTURE  (Right)  Patient Location: PACU  Anesthesia Type:General  Level of Consciousness: awake and sedated  Airway & Oxygen Therapy: Patient Spontanous Breathing and Patient connected to face mask oxygen  Post-op Assessment: Report given to RN and Post -op Vital signs reviewed and stable  Post vital signs: Reviewed and stable  Last Vitals:  Filed Vitals:   10/13/15 0921  BP: 114/73  Pulse: 80  Temp: 36.7 C  Resp: 20    Complications: No apparent anesthesia complications

## 2015-10-13 NOTE — Anesthesia Procedure Notes (Signed)
Procedure Name: LMA Insertion Date/Time: 10/13/2015 12:01 PM Performed by: York Grice Pre-anesthesia Checklist: Patient identified, Emergency Drugs available, Suction available and Patient being monitored Patient Re-evaluated:Patient Re-evaluated prior to inductionOxygen Delivery Method: Circle System Utilized Preoxygenation: Pre-oxygenation with 100% oxygen Intubation Type: IV induction Ventilation: Mask ventilation without difficulty LMA: LMA inserted LMA Size: 4.0 Number of attempts: 1 Airway Equipment and Method: Bite block Placement Confirmation: positive ETCO2 and breath sounds checked- equal and bilateral Tube secured with: Tape Dental Injury: Teeth and Oropharynx as per pre-operative assessment

## 2015-10-13 NOTE — Op Note (Signed)
NAME:  Grieves, Keimya            ACCOUNT NO.:  [id number]  MEDICAL RECORD NO.:  [id number]  LOCATION:                               FACILITY:  [facility]  PHYSICIAN:  Kolby Schara, MD        DATE OF BIRTH:  03/18/1986  DATE OF PROCEDURE:  10/13/2015 DATE OF DISCHARGE:                              OPERATIVE REPORT   PREOPERATIVE DIAGNOSIS:  Right small finger metacarpal base intra- articular fracture.  POSTOPERATIVE DIAGNOSIS:  Right small finger metacarpal base intra- articular fracture.  PROCEDURE:  Closed reduction percutaneous pinning of right small finger metacarpal base intra-articular fracture.  SURGEON:  Arvilla Salada, M.D.  ASSISTANT:  None.  ANESTHESIA:  General.  IV FLUIDS:  Per anesthesia flow sheet.  ESTIMATED BLOOD LOSS:  Minimal.  COMPLICATIONS:  None.  SPECIMENS:  None.  TOURNIQUET TIME:  Less than 30 minutes.  DISPOSITION:  Stable to PACU.  INDICATIONS:  Gwendolyn Clay is a 30-year-old female, who last week hit her right hand on the wall in frustration, injuring the small finger metacarpal.  She was seen in the emergency department, where radiographs were taken reveal a small finger metacarpal base fracture.  She is splinted and follow up in the office.  We discussed the operative and nonoperative treatment options.  She initially wanted nonoperative treatment, but then decided she wanted operative fixation.  Risks, benefits, alternatives of surgery have been discussed including the risk of blood loss; infection; damage to nerves, vessels, tendons, ligaments, bone; failure of surgery; need for additional surgery; complications with wound healing, continued pain, nonunion, and stiffness.  She voiced understanding of these risks and elected to proceed.  OPERATIVE COURSE:  After being identified preoperatively by myself, the patient and I agreed upon procedure and site of procedure.  Surgical site was marked.  The risks, benefits, and alternatives of surgery  were reviewed and she wished to proceed.  Surgical consent had been signed. She was given IV Ancef as preoperative antibiotic prophylaxis.  She was transferred to the operating room and placed on the operating table in supine position with the right upper extremity on arm board.  General anesthesia was induced by Anesthesiology.  Right upper extremity was prepped and draped in normal sterile orthopedic fashion.  Surgical pause was performed between surgeons, anesthesia, operating staff, and all were in agreement as to the patient, procedure, and site of procedure. Tourniquet at the proximal aspect of the extremity was inflated to 250 mmHg after exsanguination of the limb with Esmarch bandage.  C-arm was used in AP, lateral, and oblique projections throughout the case to aid in reduction and positioning of hardware.  A closed reduction was performed.  Three 0.045 inch K-wires were used.  One was advanced across the metacarpal base across the fracture site into the base of the ring finger metacarpal.  Two were then advanced distal to the fracture across the small finger metacarpal into the ring finger metacarpal.  This was adequate to stabilize the fracture.  Good length had been restored to the metacarpal.  The wrist was placed through a tenodesis and there was no scissoring.  The pins were bent and cut short and the pin sites dressed with sterile   Xeroform, 4x4s, and wrapped with Kerlix bandage.  A volar and dorsal slab splint including the long, ring, and small fingers was placed with the MPs flexed and the IPs extended.  This was wrapped with Kerlix and Ace bandage.  Tourniquet was deflated at less than 30 minutes.  The fingertips were pink with brisk capillary refill after deflation of tourniquet.  Operative drapes were broken down.  The patient was awoken from anesthesia safely.  She was transferred back to stretcher and taken to PACU in stable condition.  I will see her back in the  office in 1 week for postoperative followup.  We will give her Norco 5/325, 1-2 p.o. q.6 hours p.r.n. pain, dispensed #40.     Betha Loa, MD   ______________________________ Betha Loa, MD    KK/MEDQ  D:  10/13/2015  T:  10/13/2015  Job:  [id number]

## 2015-10-13 NOTE — H&P (Signed)
Gwendolyn Clay is an 30 y.o. female.   Chief Complaint: right metacarpal fracture HPI: 30 yo female states she hit right hand on 10/05/15 when frustrated.  Seen at ED where XR revealed right small metacarpal base fracture.  Splinted and followed up in office.  She wishes to have operative fixation of the fracture.  Allergies:  Allergies  Allergen Reactions  . Mold Extract [Trichophyton] Other (See Comments)    Wheezing   . Other     Roaches cause wheezing     Past Medical History  Diagnosis Date  . Hypothyroidism   . History of chicken pox   . Dysmenorrhea   . Obese   . Bacterial infection   . Trichomonas   . Gonorrhea   . Asthma   . Allergic rhinitis     adenoidectomy  . Eczema     Past Surgical History  Procedure Laterality Date  . Adenoidectomy    . Wisdom tooth extraction      Family History: Family History  Problem Relation Age of Onset  . Arthritis Maternal Aunt   . Diabetes Maternal Uncle   . Hypertension Maternal Grandmother   . Cancer Maternal Grandfather     PROSTATE  . Asthma      neices and nephews  . Allergic rhinitis Brother   . Eczema      neices and nephews  . Eczema Daughter   . Diabetes Father     Social History:   reports that she has been smoking Cigarettes.  She has been smoking about 0.20 packs per day. She has never used smokeless tobacco. She reports that she drinks alcohol. She reports that she does not use illicit drugs.  Medications: Medications Prior to Admission  Medication Sig Dispense Refill  . HYDROcodone-acetaminophen (NORCO) 5-325 MG tablet Take 1-2 tablets by mouth every 6 (six) hours as needed for severe pain. 20 tablet 0  . ibuprofen (ADVIL,MOTRIN) 800 MG tablet Take 1 tablet (800 mg total) by mouth every 8 (eight) hours as needed. 30 tablet 5  . levonorgestrel-ethinyl estradiol (SEASONALE,INTROVALE,JOLESSA) 0.15-0.03 MG tablet Take 1 tablet by mouth daily. 1 Package 4  . levothyroxine (SYNTHROID) 175 MCG tablet  Take 1 tablet (175 mcg total) by mouth daily before breakfast. 30 tablet 2  . naproxen (NAPROSYN) 500 MG tablet Take 1 tablet (500 mg total) by mouth 2 (two) times daily as needed for mild pain, moderate pain or headache (TAKE WITH MEALS.). 20 tablet 0  . Prenat-Fe Poly-Methfol-FA-DHA (VITAFOL ULTRA) 29-0.6-0.4-200 MG CAPS Take 1 capsule by mouth daily before breakfast. 30 capsule 11  . valACYclovir (VALTREX) 1000 MG tablet Take 1 tablet (1,000 mg total) by mouth 2 (two) times daily. 30 tablet prn    No results found for this or any previous visit (from the past 48 hour(s)).  No results found.   A comprehensive review of systems was negative.  Blood pressure 114/73, pulse 80, temperature 98.1 F (36.7 C), temperature source Oral, resp. rate 20, height  (1.778 m), weight 98.884 kg (218 lb), last menstrual period 10/13/2015, SpO2 100 %, not currently breastfeeding.  General appearance: alert, cooperative and appears stated age Head: Normocephalic, without obvious abnormality, atraumatic Neck: supple, symmetrical, trachea midline Resp: clear to auscultation bilaterally Cardio: regular rate and rhythm GI: non-tender Extremities: Intact sensation and capillary refill all digits.  +epl/fpl/io.  No wounds. Pulses: 2+ and symmetric Skin: Skin color, texture, turgor normal. No rashes or lesions Neurologic: Grossly normal Incision/Wound: none  Assessment/Plan Right  small finger metacarpal base fracture.  Non operative and operative treatment options were discussed with the patient and patient wishes to proceed with operative treatment. Risks, benefits, and alternatives of surgery were discussed and the patient agrees with the plan of care.   Demitra Danley R 10/13/2015, 9:44 AM

## 2015-10-13 NOTE — Brief Op Note (Signed)
10/13/2015  12:33 PM  PATIENT:  [name]  30 y.o. female  PRE-OPERATIVE DIAGNOSIS:  RIGHT SMALL METACARPAL base FRACTURE   POST-OPERATIVE DIAGNOSIS:  RIGHT SMALL METACARPAL base FRACTURE   PROCEDURE:  Procedure(s): CLOSED REDUCTION PERCUTANEOUS PINNING RIGHT SMALL METACARPAL FRACTURE  (Right)  SURGEON:  Surgeon(s) and Role:    * Betha Loa, MD - Primary  PHYSICIAN ASSISTANT:   ASSISTANTS: none   ANESTHESIA:   general  EBL:  Total I/O In: 800 [I.V.:800] Out: -   BLOOD ADMINISTERED:none  DRAINS: none   LOCAL MEDICATIONS USED:  MARCAINE     SPECIMEN:  No Specimen  DISPOSITION OF SPECIMEN:  N/A  COUNTS:  YES  TOURNIQUET:   Total Tourniquet Time Documented: Upper Arm (Right) - -[id number] minutes Total: Upper Arm (Right) - -[id number] minutes   DICTATION: .Other Dictation: Dictation Number [id number]  PLAN OF CARE: Discharge to home after PACU  PATIENT DISPOSITION:  PACU - hemodynamically stable.

## 2015-10-13 NOTE — Op Note (Signed)
[id number] 

## 2015-10-13 NOTE — Op Note (Signed)
Intra-operative fluoroscopic images in the AP, lateral, and oblique views were taken and evaluated by myself.  Reduction and hardware placement were confirmed.  There was no intraarticular penetration of permanent hardware.  

## 2015-10-14 ENCOUNTER — Encounter (HOSPITAL_BASED_OUTPATIENT_CLINIC_OR_DEPARTMENT_OTHER): Payer: Self-pay | Admitting: Orthopedic Surgery

## 2015-11-21 DIAGNOSIS — M79643 Pain in unspecified hand: Secondary | ICD-10-CM | POA: Insufficient documentation

## 2015-11-21 DIAGNOSIS — S62316D Displaced fracture of base of fifth metacarpal bone, right hand, subsequent encounter for fracture with routine healing: Secondary | ICD-10-CM | POA: Insufficient documentation

## 2015-12-12 ENCOUNTER — Ambulatory Visit: Payer: Medicaid Other | Admitting: Obstetrics

## 2016-01-05 ENCOUNTER — Emergency Department (HOSPITAL_COMMUNITY): Payer: Medicaid Other

## 2016-01-05 ENCOUNTER — Emergency Department (HOSPITAL_COMMUNITY)
Admission: EM | Admit: 2016-01-05 | Discharge: 2016-01-05 | Disposition: A | Discharge status: Home / Self Care | Payer: Medicaid Other | Attending: Emergency Medicine | Admitting: Emergency Medicine

## 2016-01-05 ENCOUNTER — Encounter (HOSPITAL_COMMUNITY): Payer: Self-pay | Admitting: Emergency Medicine

## 2016-01-05 DIAGNOSIS — J069 Acute upper respiratory infection, unspecified: Secondary | ICD-10-CM | POA: Diagnosis present

## 2016-01-05 DIAGNOSIS — E039 Hypothyroidism, unspecified: Secondary | ICD-10-CM | POA: Diagnosis not present

## 2016-01-05 DIAGNOSIS — E669 Obesity, unspecified: Secondary | ICD-10-CM | POA: Diagnosis not present

## 2016-01-05 DIAGNOSIS — N760 Acute vaginitis: Secondary | ICD-10-CM

## 2016-01-05 DIAGNOSIS — Z79899 Other long term (current) drug therapy: Secondary | ICD-10-CM | POA: Diagnosis not present

## 2016-01-05 DIAGNOSIS — F17210 Nicotine dependence, cigarettes, uncomplicated: Secondary | ICD-10-CM | POA: Insufficient documentation

## 2016-01-05 DIAGNOSIS — J45901 Unspecified asthma with (acute) exacerbation: Secondary | ICD-10-CM | POA: Insufficient documentation

## 2016-01-05 DIAGNOSIS — R05 Cough: Secondary | ICD-10-CM

## 2016-01-05 DIAGNOSIS — B9689 Other specified bacterial agents as the cause of diseases classified elsewhere: Secondary | ICD-10-CM

## 2016-01-05 DIAGNOSIS — E0500 Thyrotoxicosis with diffuse goiter without thyrotoxic crisis or storm: Secondary | ICD-10-CM | POA: Insufficient documentation

## 2016-01-05 DIAGNOSIS — R059 Cough, unspecified: Secondary | ICD-10-CM

## 2016-01-05 LAB — URINALYSIS, ROUTINE W REFLEX MICROSCOPIC
Bilirubin Urine: NEGATIVE
Glucose, UA: NEGATIVE mg/dL
Hgb urine dipstick: NEGATIVE
Ketones, ur: NEGATIVE mg/dL
Leukocytes, UA: NEGATIVE
Nitrite: NEGATIVE
Protein, ur: NEGATIVE mg/dL
Specific Gravity, Urine: 1.009 (ref 1.005–1.030)
pH: 7 (ref 5.0–8.0)

## 2016-01-05 LAB — PREGNANCY, URINE: Preg Test, Ur: NEGATIVE

## 2016-01-05 LAB — WET PREP, GENITAL
Sperm: NONE SEEN
Trich, Wet Prep: NONE SEEN
Yeast Wet Prep HPF POC: NONE SEEN

## 2016-01-05 LAB — RAPID STREP SCREEN (MED CTR MEBANE ONLY): Streptococcus, Group A Screen (Direct): NEGATIVE

## 2016-01-05 MED ORDER — STERILE WATER FOR INJECTION IJ SOLN: 0.9000 mL | Freq: Once | INTRAMUSCULAR | Status: DC

## 2016-01-05 MED ORDER — ALBUTEROL SULFATE (5 MG/ML) 0.5% IN NEBU: 2.5000 mg | INHALATION_SOLUTION | Freq: Four times a day (QID) | RESPIRATORY_TRACT | Status: DC | PRN

## 2016-01-05 MED ORDER — ALBUTEROL SULFATE (2.5 MG/3ML) 0.083% IN NEBU
5.0000 mg | INHALATION_SOLUTION | Freq: Once | RESPIRATORY_TRACT | Status: AC
  Administered 2016-01-05: 5 mg via RESPIRATORY_TRACT
  Filled 2016-01-05: qty 6

## 2016-01-05 MED ORDER — IBUPROFEN 400 MG PO TABS
600.0000 mg | ORAL_TABLET | Freq: Once | ORAL | Status: AC
  Administered 2016-01-05: 600 mg via ORAL
  Filled 2016-01-05: qty 1

## 2016-01-05 MED ORDER — AZITHROMYCIN 250 MG PO TABS
1000.0000 mg | ORAL_TABLET | Freq: Once | ORAL | Status: AC
  Administered 2016-01-05: 1000 mg via ORAL
  Filled 2016-01-05: qty 4

## 2016-01-05 MED ORDER — STERILE WATER FOR INJECTION IJ SOLN
INTRAMUSCULAR | Status: DC
Start: 2016-01-05 — End: 2016-01-05
  Filled 2016-01-05: qty 10

## 2016-01-05 MED ORDER — METRONIDAZOLE 500 MG PO TABS: 500.0000 mg | ORAL_TABLET | Freq: Two times a day (BID) | ORAL | Status: DC

## 2016-01-05 MED ORDER — CETIRIZINE-PSEUDOEPHEDRINE ER 5-120 MG PO TB12: 1.0000 | ORAL_TABLET | Freq: Two times a day (BID) | ORAL | Status: DC

## 2016-01-05 MED ORDER — BENZONATATE 100 MG PO CAPS: 100.0000 mg | ORAL_CAPSULE | Freq: Three times a day (TID) | ORAL | Status: DC

## 2016-01-05 MED ORDER — CEFTRIAXONE SODIUM 250 MG IJ SOLR
250.0000 mg | Freq: Once | INTRAMUSCULAR | Status: DC
  Filled 2016-01-05: qty 250

## 2016-01-05 NOTE — ED Provider Notes (Signed)
CSN: [id number]     Arrival date & time 01/05/16  1324 History   First MD Initiated Contact with Patient 01/05/16 1009     Chief Complaint  Patient presents with  . URI     (Consider location/radiation/quality/duration/timing/severity/associated sxs/prior Treatment) HPI Comments: Patient is a 30 year old female with history of asthma who presents with cough and shortness of breath with exertion since Sunday. She describes the cough is productive. Patient also reports she began the sore throat yesterday. Patient has had associated chest tightness. Patient has also had associated intermittent abdominal cramping and one episode of small amount of diarrhea yesterday. Patient has ran out of her nebulizer at home and has been taking her Symbicort and nasal spray without relief. Patient has an irregular menstrual period due to on the Nexplanon implant. Patient denies fevers, chest pain, nausea, vomiting, abnormal vaginal discharge.  Patient is a 30 y.o. female presenting with URI. The history is provided by the patient.  URI Presenting symptoms: cough and sore throat   Presenting symptoms: no fever   Associated symptoms: no headaches     Past Medical History  Diagnosis Date  . Hypothyroidism   . History of chicken pox   . Dysmenorrhea   . Obese   . Bacterial infection   . Trichomonas   . Gonorrhea   . Asthma   . Allergic rhinitis     adenoidectomy  . Eczema   . Graves' disease   . Hypothyroidism    Past Surgical History  Procedure Laterality Date  . Adenoidectomy    . Wisdom tooth extraction    . Closed reduction metacarpal with percutaneous pinning Right 10/13/2015    Procedure: CLOSED REDUCTION PERCUTANEOUS PINNING RIGHT SMALL METACARPAL FRACTURE ;  Surgeon: Betha Loa, MD;  Location: [facility];  Service: Orthopedics;  Laterality: Right;   Family History  Problem Relation Age of Onset  . Arthritis Maternal Aunt   . Diabetes Maternal Uncle   . Hypertension  Maternal Grandmother   . Cancer Maternal Grandfather     PROSTATE  . Asthma      neices and nephews  . Allergic rhinitis Brother   . Eczema      neices and nephews  . Eczema Daughter   . Diabetes Father    Social History  Substance Use Topics  . Smoking status: Current Every Day Smoker -- 0.20 packs/day    Types: Cigarettes    Last Attempt to Quit: 03/30/2014  . Smokeless tobacco: Never Used     Comment: smoke 5-6 cigarettes daily  . Alcohol Use: Yes     Comment: occasional   OB History    Gravida Para Term Preterm AB TAB SAB Ectopic Multiple Living   0 2     Review of Systems  Constitutional: Negative for fever and chills.  HENT: Positive for sore throat. Negative for facial swelling.   Respiratory: Positive for cough and shortness of breath.   Cardiovascular: Negative for chest pain.  Gastrointestinal: Positive for abdominal pain (cramping). Negative for nausea and vomiting.  Genitourinary: Negative for dysuria.  Musculoskeletal: Negative for back pain.  Skin: Negative for rash and wound.  Neurological: Negative for headaches.  Psychiatric/Behavioral: The patient is not nervous/anxious.       Allergies  Mold extract and Other  Home Medications   Prior to Admission medications   Medication Sig Start Date End Date Taking? Authorizing Provider  albuterol (PROVENTIL HFA;VENTOLIN HFA) 108 (90  Base) MCG/ACT inhaler Inhale 2 puffs into the lungs every 6 (six) hours as needed for wheezing or shortness of breath.   Yes Historical Provider, MD  budesonide-formoterol (SYMBICORT) 160-4.5 MCG/ACT inhaler Inhale 2 puffs into the lungs 2 (two) times daily.   Yes Historical Provider, MD  etonogestrel (NEXPLANON) 68 MG IMPL implant 1 each by Subdermal route once.   Yes Historical Provider, MD  fluticasone (FLONASE) 50 MCG/ACT nasal spray Place 1 spray into both nostrils daily as needed for allergies or rhinitis.   Yes Historical Provider, MD  levothyroxine (SYNTHROID)  175 MCG tablet Take 1 tablet (175 mcg total) by mouth daily before breakfast. 04/13/15  Yes Brock Badharles A Harper, MD  albuterol (PROVENTIL) (5 MG/ML) 0.5% nebulizer solution Take 0.5 mLs (2.5 mg total) by nebulization every 6 (six) hours as needed for wheezing or shortness of breath. 01/05/16   Waylan BogaAlexandra M Lattie Riege, PA-C  benzonatate (TESSALON) 100 MG capsule Take 1 capsule (100 mg total) by mouth every 8 (eight) hours. 01/05/16   Elad Macphail M Theola Cuellar, PA-C  cetirizine-pseudoephedrine (ZYRTEC-D) 5-120 MG tablet Take 1 tablet by mouth 2 (two) times daily. 01/05/16   Emi HolesAlexandra M Albin Duckett, PA-C  metroNIDAZOLE (FLAGYL) 500 MG tablet Take 1 tablet (500 mg total) by mouth 2 (two) times daily. 01/05/16   Metha Kolasa M Shoaib Siefker, PA-C   BP 105/63 mmHg  Pulse 78  Temp(Src) 98.2 F (36.8 C) (Oral)  Resp 22  SpO2 100%  LMP 12/06/2015 (Approximate) Physical Exam  Constitutional: She appears well-developed and well-nourished. No distress.  HENT:  Head: Normocephalic and atraumatic.  Mouth/Throat: No trismus in the jaw. No uvula swelling. Posterior oropharyngeal edema and posterior oropharyngeal erythema present. No oropharyngeal exudate or tonsillar abscesses.  Eyes: Conjunctivae are normal. Pupils are equal, round, and reactive to light. Right eye exhibits no discharge. Left eye exhibits no discharge. No scleral icterus.  Neck: Normal range of motion. Neck supple. No thyromegaly present.  Cardiovascular: Normal rate, regular rhythm, normal heart sounds and intact distal pulses.  Exam reveals no gallop and no friction rub.   No murmur heard. Pulmonary/Chest: Effort normal and breath sounds normal. No stridor. No respiratory distress. She has no wheezes. She has no rales.  Abdominal: Soft. Bowel sounds are normal. She exhibits no distension. There is tenderness in the periumbilical area. There is no rebound and no guarding.    Genitourinary: There is no rash or tenderness on the right labia. There is no rash or tenderness on the left  labia. Cervix exhibits discharge (white/yellow, could by physiologic). Cervix exhibits no motion tenderness. Right adnexum displays no mass, no tenderness and no fullness. Left adnexum displays no mass, no tenderness and no fullness. No erythema or bleeding in the vagina. No foreign body around the vagina. No signs of injury around the vagina. Vaginal discharge (white/yellow, could be physiologic) found.  Musculoskeletal: She exhibits no edema.  Lymphadenopathy:    She has no cervical adenopathy.  Neurological: She is alert. Coordination normal.  Skin: Skin is warm and dry. No rash noted. She is not diaphoretic. No pallor.  Psychiatric: She has a normal mood and affect.  Nursing note and vitals reviewed.   ED Course  Procedures (including critical care time) Labs Review Labs Reviewed  WET PREP, GENITAL - Abnormal; Notable for the following:    Clue Cells Wet Prep HPF POC PRESENT (*)    WBC, Wet Prep HPF POC MANY (*)    All other components within normal limits  RAPID STREP SCREEN (NOT AT  [facility])  URINE CULTURE  CULTURE, GROUP A STREP [facility])  PREGNANCY, URINE  URINALYSIS, ROUTINE W REFLEX MICROSCOPIC (NOT AT [facility])  RPR  HIV ANTIBODY (ROUTINE TESTING)  GC/CHLAMYDIA PROBE AMP ([place]) NOT AT [facility]    Imaging Review Dg Chest 2 View  01/05/2016  CLINICAL DATA:  Cough, shortness of breath, asthma EXAM: CHEST  2 VIEW COMPARISON:  01/02/2014 FINDINGS: Cardiomediastinal silhouette is stable. No acute infiltrate or pleural effusion. No pulmonary edema. Bony thorax is unremarkable. IMPRESSION: No active cardiopulmonary disease. Electronically Signed   By: Natasha Mead M.D.   On: 01/05/2016 11:13   I have personally reviewed and evaluated these images and lab results as part of my medical decision-making.   EKG Interpretation None      On reassessment following albuterol nebulizer treatment, patient states her breathing and chest tightness is improved.  MDM   Patient with mild signs  and symptoms of asthma and URI. Suspect viral or menstrual cause to abdominal cramping and upset or diarrhea. Oxygen saturation is above 90%. No accessory muscle use, no cyanosis. Treated in the ED with albuterol nebulizer.  Patient feels improved after treatment. Will discharge with refill of albuterol nebulizer, Zyrtec-D, Tessalon. Due to patient's mild abdominal cramping and Episode of diarrhea, pelvic exam was done. Wet prep shows clue cells and many WBCs. UA negative, urine culture sent. Urine pregnancy negative. HIV, RPR sent. Will discharge patient with Flagyl.  CXR shows no active cardiopulmonary disease. Patient also opted to be treated with ceftriaxone and azithromycin in NAD. Pt instructed to follow up with PCP. Discussed return precautions. Patient vitals stable throughout ED course and discharged in satisfactory condition.   Final diagnoses:  Cough  Asthma exacerbation  BV (bacterial vaginosis)        Emi Holes, PA-C 01/05/16 1311  Doug Sou, MD 01/06/16 1610

## 2016-01-05 NOTE — Discharge Instructions (Signed)
Medications: Zyrtec-D, Tessalon, Flagyl, albuterol nebulizer solution  Treatment: Take Zyrtec-D twice daily as prescribed. Take Tessalon every 8 hours as needed for cough. Take Flagyl as prescribed for 2 weeks for your bacterial vaginosis. You will be called in 2-3 days if any of your results return POSITIVE. You were treated in the emergency Department for gonorrhea and chlamydia today. Use your albuterol nebulizer solution at home as you have been directed in the past.  Follow-up: Please follow-up with your primary care provider for follow-up of today's asthma exacerbation and if your abdominal cramping continues. Please return the emergency Department if you develop any new or worsening symptoms.   Asthma, Adult Asthma is a condition of the lungs in which the airways tighten and narrow. Asthma can make it hard to breathe. Asthma cannot be cured, but medicine and lifestyle changes can help control it. Asthma may be started (triggered) by:  Animal skin flakes (dander).  Dust.  Cockroaches.  Pollen.  Mold.  Smoke.  Cleaning products.  Hair sprays or aerosol sprays.  Paint fumes or strong smells.  Cold air, weather changes, and winds.  Crying or laughing hard.  Stress.  Certain medicines or drugs.  Foods, such as dried fruit, potato chips, and sparkling grape juice.  Infections or conditions (colds, flu).  Exercise.  Certain medical conditions or diseases.  Exercise or tiring activities. HOME CARE   Take medicine as told by your doctor.  Use a peak flow meter as told by your doctor. A peak flow meter is a tool that measures how well the lungs are working.  Record and keep track of the peak flow meter's readings.  Understand and use the asthma action plan. An asthma action plan is a written plan for taking care of your asthma and treating your attacks.  To help prevent asthma attacks:  Do not smoke. Stay away from secondhand smoke.  Change your heating and air  conditioning filter often.  Limit your use of fireplaces and wood stoves.  Get rid of pests (such as roaches and mice) and their droppings.  Throw away plants if you see mold on them.  Clean your floors. Dust regularly. Use cleaning products that do not smell.  Have someone vacuum when you are not home. Use a vacuum cleaner with a HEPA filter if possible.  Replace carpet with wood, tile, or vinyl flooring. Carpet can trap animal skin flakes and dust.  Use allergy-proof pillows, mattress covers, and box spring covers.  Wash bed sheets and blankets every week in hot water and dry them in a dryer.  Use blankets that are made of polyester or cotton.  Clean bathrooms and kitchens with bleach. If possible, have someone repaint the walls in these rooms with mold-resistant paint. Keep out of the rooms that are being cleaned and painted.  Wash hands often. GET HELP IF:  You have make a whistling sound when breaking (wheeze), have shortness of breath, or have a cough even if taking medicine to prevent attacks.  The colored mucus you cough up (sputum) is thicker than usual.  The colored mucus you cough up changes from clear or white to yellow, green, gray, or bloody.  You have problems from the medicine you are taking such as:  A rash.  Itching.  Swelling.  Trouble breathing.  You need reliever medicines more than 2-3 times a week.  Your peak flow measurement is still at 50-79% of your personal best after following the action plan for 1 hour.  You  have a fever. GET HELP RIGHT AWAY IF:   You seem to be worse and are not responding to medicine during an asthma attack.  You are short of breath even at rest.  You get short of breath when doing very little activity.  You have trouble eating, drinking, or talking.  You have chest pain.  You have a fast heartbeat.  Your lips or fingernails start to turn blue.  You are light-headed, dizzy, or faint.  Your peak flow is  less than 50% of your personal best.   This information is not intended to replace advice given to you by your health care provider. Make sure you discuss any questions you have with your health care provider.   Document Released: 01/16/2008 Document Revised: 04/20/2015 Document Reviewed: 02/26/2013 Elsevier Interactive Patient Education 2016 Else[employer] Inc.  Bacterial Vaginosis Bacterial vaginosis is a vaginal infection that occurs when the normal balance of bacteria in the vagina is disrupted. It results from an overgrowth of certain bacteria. This is the most common vaginal infection in women of childbearing age. Treatment is important to prevent complications, especially in pregnant women, as it can cause a premature delivery. CAUSES  Bacterial vaginosis is caused by an increase in harmful bacteria that are normally present in smaller amounts in the vagina. Several different kinds of bacteria can cause bacterial vaginosis. However, the reason that the condition develops is not fully understood. RISK FACTORS Certain activities or behaviors can put you at an increased risk of developing bacterial vaginosis, including:  Having a new sex partner or multiple sex partners.  Douching.  Using an intrauterine device (IUD) for contraception. Women do not get bacterial vaginosis from toilet seats, bedding, swimming pools, or contact with objects around them. SIGNS AND SYMPTOMS  Some women with bacterial vaginosis have no signs or symptoms. Common symptoms include:  Grey vaginal discharge.  A fishlike odor with discharge, especially after sexual intercourse.  Itching or burning of the vagina and vulva.  Burning or pain with urination. DIAGNOSIS  Your health care provider will take a medical history and examine the vagina for signs of bacterial vaginosis. A sample of vaginal fluid may be taken. Your health care provider will look at this sample under a microscope to check for bacteria and  abnormal cells. A vaginal pH test may also be done.  TREATMENT  Bacterial vaginosis may be treated with antibiotic medicines. These may be given in the form of a pill or a vaginal cream. A second round of antibiotics may be prescribed if the condition comes back after treatment. Because bacterial vaginosis increases your risk for sexually transmitted diseases, getting treated can help reduce your risk for chlamydia, gonorrhea, HIV, and herpes. HOME CARE INSTRUCTIONS   Only take over-the-counter or prescription medicines as directed by your health care provider.  If antibiotic medicine was prescribed, take it as directed. Make sure you finish it even if you start to feel better.  Tell all sexual partners that you have a vaginal infection. They should see their health care provider and be treated if they have problems, such as a mild rash or itching.  During treatment, it is important that you follow these instructions:  Avoid sexual activity or use condoms correctly.  Do not douche.  Avoid alcohol as directed by your health care provider.  Avoid breastfeeding as directed by your health care provider. SEEK MEDICAL CARE IF:   Your symptoms are not improving after 3 days of treatment.  You have increased discharge  or pain.  You have a fever. MAKE SURE YOU:   Understand these instructions.  Will watch your condition.  Will get help right away if you are not doing well or get worse. FOR MORE INFORMATION  Centers for Disease Control and Prevention, Division of STD Prevention: [employer]Apps.[employer].za American Sexual Health Association (ASHA): www.ashastd.org    This information is not intended to replace advice given to you by your health care provider. Make sure you discuss any questions you have with your health care provider.   Document Released: 07/30/2005 Document Revised: 08/20/2014 Document Reviewed: 03/11/2013 Elsevier Interactive Patient Education [employer]

## 2016-01-05 NOTE — ED Notes (Signed)
Patient complains of sore throat, tender swollen glands around her neck, and cough since Sunday.  Patient also states she has asthma and ran out of albuterol 2 days ago.  Patient alert and in no apparent distress at this time.

## 2016-01-06 LAB — RPR: RPR Ser Ql: NONREACTIVE

## 2016-01-06 LAB — URINE CULTURE

## 2016-01-06 LAB — GC/CHLAMYDIA PROBE AMP (~~LOC~~) NOT AT ARMC
Chlamydia: NEGATIVE
Neisseria Gonorrhea: POSITIVE — AB

## 2016-01-06 LAB — HIV ANTIBODY (ROUTINE TESTING W REFLEX): HIV Screen 4th Generation wRfx: NONREACTIVE

## 2016-01-07 LAB — CULTURE, GROUP A STREP (THRC)

## 2016-01-09 DIAGNOSIS — M25649 Stiffness of unspecified hand, not elsewhere classified: Secondary | ICD-10-CM | POA: Insufficient documentation

## 2016-01-10 ENCOUNTER — Telehealth (HOSPITAL_BASED_OUTPATIENT_CLINIC_OR_DEPARTMENT_OTHER): Payer: Self-pay | Admitting: Emergency Medicine

## 2016-01-10 NOTE — Telephone Encounter (Signed)
Chart handoff to EDP for treatment plan for + GC, received Azithromycin but patient refused Ceftiaxone while in ED

## 2016-01-11 ENCOUNTER — Encounter: Payer: Self-pay | Admitting: Obstetrics

## 2016-01-11 ENCOUNTER — Ambulatory Visit (INDEPENDENT_AMBULATORY_CARE_PROVIDER_SITE_OTHER): Payer: Medicaid Other | Admitting: Obstetrics

## 2016-01-11 VITALS — BP 100/66 | HR 73 | Temp 98.2°F | Wt 208.0 lb

## 2016-01-11 DIAGNOSIS — A549 Gonococcal infection, unspecified: Secondary | ICD-10-CM | POA: Diagnosis not present

## 2016-01-11 DIAGNOSIS — N730 Acute parametritis and pelvic cellulitis: Secondary | ICD-10-CM

## 2016-01-11 MED ORDER — DOXYCYCLINE HYCLATE 100 MG PO CAPS: 100.0000 mg | ORAL_CAPSULE | Freq: Two times a day (BID) | ORAL | Status: DC

## 2016-01-11 MED ORDER — METRONIDAZOLE 500 MG PO TABS: 500.0000 mg | ORAL_TABLET | Freq: Two times a day (BID) | ORAL | Status: DC

## 2016-01-11 MED ORDER — CEFTRIAXONE SODIUM 1 G IJ SOLR
250.0000 mg | Freq: Once | INTRAMUSCULAR | Status: AC
  Administered 2016-01-11: 250 mg via INTRAMUSCULAR

## 2016-01-11 NOTE — Addendum Note (Signed)
Addended by: Marya LandryFOSTER, Tytus Strahle D on: 01/11/2016 02:15 PM   Modules accepted: Orders

## 2016-01-11 NOTE — Progress Notes (Signed)
Patient ID: Gwendolyn Clay, female   DOB: May 07, 1986, 30 y.o.   MRN: [id number]  Chief Complaint  Patient presents with  . Follow-up    recent hospital visit, +GC-needs treatment today.    HPI Gwendolyn Clay is a 30 y.o. female.  H/O positive GC.  Presents today for treatment.  C/O tiredness, dizziness and weight loss.  + H/O Hypothyroidism. HPI  Past Medical History  Diagnosis Date  . Hypothyroidism   . History of chicken pox   . Dysmenorrhea   . Obese   . Bacterial infection   . Trichomonas   . Gonorrhea   . Asthma   . Allergic rhinitis     adenoidectomy  . Eczema   . Graves' disease   . Hypothyroidism     Past Surgical History  Procedure Laterality Date  . Adenoidectomy    . Wisdom tooth extraction    . Closed reduction metacarpal with percutaneous pinning Right 10/13/2015    Procedure: CLOSED REDUCTION PERCUTANEOUS PINNING RIGHT SMALL METACARPAL FRACTURE ;  Surgeon: Betha Loa, MD;  Location: [facility];  Service: Orthopedics;  Laterality: Right;    Family History  Problem Relation Age of Onset  . Arthritis Maternal Aunt   . Diabetes Maternal Uncle   . Hypertension Maternal Grandmother   . Cancer Maternal Grandfather     PROSTATE  . Asthma      neices and nephews  . Allergic rhinitis Brother   . Eczema      neices and nephews  . Eczema Daughter   . Diabetes Father     Social History Social History  Substance Use Topics  . Smoking status: Current Every Day Smoker -- 0.20 packs/day    Types: Cigarettes    Last Attempt to Quit: 03/30/2014  . Smokeless tobacco: Never Used     Comment: smoke 5-6 cigarettes daily  . Alcohol Use: Yes     Comment: occasional    Allergies  Allergen Reactions  . Mold Extract [Trichophyton] Other (See Comments)    Wheezing   . Other     Roaches cause wheezing     Current Outpatient Prescriptions  Medication Sig Dispense Refill  . etonogestrel (NEXPLANON) 68 MG IMPL implant 1 each by Subdermal  route once.    . metroNIDAZOLE (FLAGYL) 500 MG tablet Take 1 tablet (500 mg total) by mouth 2 (two) times daily. 14 tablet 0  . albuterol (PROVENTIL HFA;VENTOLIN HFA) 108 (90 Base) MCG/ACT inhaler Inhale 2 puffs into the lungs every 6 (six) hours as needed for wheezing or shortness of breath. Reported on 01/11/2016    . albuterol (PROVENTIL) (5 MG/ML) 0.5% nebulizer solution Take 0.5 mLs (2.5 mg total) by nebulization every 6 (six) hours as needed for wheezing or shortness of breath. (Patient not taking: Reported on 01/11/2016) 20 mL 12  . benzonatate (TESSALON) 100 MG capsule Take 1 capsule (100 mg total) by mouth every 8 (eight) hours. (Patient not taking: Reported on 01/11/2016) 21 capsule 0  . budesonide-formoterol (SYMBICORT) 160-4.5 MCG/ACT inhaler Inhale 2 puffs into the lungs 2 (two) times daily. Reported on 01/11/2016    . cetirizine-pseudoephedrine (ZYRTEC-D) 5-120 MG tablet Take 1 tablet by mouth 2 (two) times daily. (Patient not taking: Reported on 01/11/2016) 30 tablet 0  . doxycycline (VIBRAMYCIN) 100 MG capsule Take 1 capsule (100 mg total) by mouth 2 (two) times daily. 28 capsule 0  . fluticasone (FLONASE) 50 MCG/ACT nasal spray Place 1 spray into both nostrils daily as needed  for allergies or rhinitis. Reported on 01/11/2016    . levothyroxine (SYNTHROID) 175 MCG tablet Take 1 tablet (175 mcg total) by mouth daily before breakfast. (Patient not taking: Reported on 01/11/2016) 30 tablet 2   No current facility-administered medications for this visit.    Review of Systems Review of Systems Constitutional: positive for fatigue and weight loss Respiratory: negative for cough and wheezing Cardiovascular: negative for chest pain, fatigue and palpitations Gastrointestinal: negative for abdominal pain and change in bowel habits Genitourinary:positive for AUB and crampng Integument/breast: negative for nipple discharge Musculoskeletal:negative for myalgias Neurological: negative for gait  problems and tremors Behavioral/Psych: negative for abusive relationship, depression Endocrine: negative for temperature intolerance     Blood pressure 100/66, pulse 73, temperature 98.2 F (36.8 C), weight 208 lb (94.348 kg), last menstrual period 12/06/2015, not currently breastfeeding.  Physical Exam Physical Exam:  Deferred  100% of 10 min visit spent on counseling and coordination of care.   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 CBC with Differential  . Comprehensive metabolic panel  . TSH   Meds ordered this encounter  Medications  . doxycycline (VIBRAMYCIN) 100 MG capsule    Sig: Take 1 capsule (100 mg total) by mouth 2 (two) times daily.    Dispense:  28 capsule    Refill:  0  . metroNIDAZOLE (FLAGYL) 500 MG tablet    Sig: Take 1 tablet (500 mg total) by mouth 2 (two) times daily.    Dispense:  14 tablet    Refill:  0

## 2016-01-12 ENCOUNTER — Telehealth: Payer: Self-pay | Admitting: *Deleted

## 2016-01-12 LAB — CBC WITH DIFFERENTIAL/PLATELET
Basophils Absolute: 0 10*3/uL (ref 0.0–0.2)
Basos: 1 %
EOS (ABSOLUTE): 0 10*3/uL (ref 0.0–0.4)
EOS: 1 %
HEMATOCRIT: 37.9 % (ref 34.0–46.6)
HEMOGLOBIN: 12.6 g/dL (ref 11.1–15.9)
IMMATURE GRANS (ABS): 0 10*3/uL (ref 0.0–0.1)
Immature Granulocytes: 0 %
LYMPHS ABS: 1.4 10*3/uL (ref 0.7–3.1)
LYMPHS: 41 %
MCH: 29.5 pg (ref 26.6–33.0)
MCHC: 33.2 g/dL (ref 31.5–35.7)
MCV: 89 fL (ref 79–97)
MONOCYTES: 7 %
Monocytes Absolute: 0.3 10*3/uL (ref 0.1–0.9)
NEUTROS ABS: 1.8 10*3/uL (ref 1.4–7.0)
Neutrophils: 50 %
Platelets: 283 10*3/uL (ref 150–379)
RBC: 4.27 x10E6/uL (ref 3.77–5.28)
RDW: 14.1 % (ref 12.3–15.4)
WBC: 3.5 10*3/uL (ref 3.4–10.8)

## 2016-01-12 LAB — COMPREHENSIVE METABOLIC PANEL
ALBUMIN: 3.7 g/dL (ref 3.5–5.5)
ALK PHOS: 56 IU/L (ref 39–117)
ALT: 7 IU/L (ref 0–32)
AST: 16 IU/L (ref 0–40)
Albumin/Globulin Ratio: 1.5 (ref 1.2–2.2)
BUN / CREAT RATIO: 7 — AB (ref 9–23)
BUN: 6 mg/dL (ref 6–20)
Bilirubin Total: 0.9 mg/dL (ref 0.0–1.2)
CO2: 24 mmol/L (ref 18–29)
CREATININE: 0.91 mg/dL (ref 0.57–1.00)
Calcium: 9.1 mg/dL (ref 8.7–10.2)
Chloride: 103 mmol/L (ref 96–106)
GFR, EST AFRICAN AMERICAN: 99 mL/min/{1.73_m2} (ref >59)
GFR, EST NON AFRICAN AMERICAN: 86 mL/min/{1.73_m2} (ref >59)
GLOBULIN, TOTAL: 2.5 g/dL (ref 1.5–4.5)
Glucose: 91 mg/dL (ref 65–99)
Potassium: 4.4 mmol/L (ref 3.5–5.2)
SODIUM: 139 mmol/L (ref 134–144)
TOTAL PROTEIN: 6.2 g/dL (ref 6.0–8.5)

## 2016-01-12 LAB — TSH: TSH: 1.37 u[IU]/mL (ref 0.450–4.500)

## 2016-02-05 ENCOUNTER — Other Ambulatory Visit: Payer: Self-pay | Admitting: Obstetrics

## 2016-02-15 ENCOUNTER — Encounter: Payer: Self-pay | Admitting: Allergy and Immunology

## 2016-02-15 ENCOUNTER — Ambulatory Visit (INDEPENDENT_AMBULATORY_CARE_PROVIDER_SITE_OTHER): Payer: Medicaid Other | Admitting: Allergy and Immunology

## 2016-02-15 VITALS — BP 114/76 | HR 88 | Temp 98.3°F | Resp 20 | Ht 70.0 in | Wt 202.6 lb

## 2016-02-15 DIAGNOSIS — R062 Wheezing: Secondary | ICD-10-CM | POA: Diagnosis not present

## 2016-02-15 DIAGNOSIS — R05 Cough: Secondary | ICD-10-CM | POA: Diagnosis not present

## 2016-02-15 DIAGNOSIS — J309 Allergic rhinitis, unspecified: Secondary | ICD-10-CM | POA: Diagnosis not present

## 2016-02-15 DIAGNOSIS — R059 Cough, unspecified: Secondary | ICD-10-CM

## 2016-02-15 DIAGNOSIS — H1010 Acute atopic conjunctivitis, unspecified eye: Secondary | ICD-10-CM | POA: Diagnosis not present

## 2016-02-15 MED ORDER — BUDESONIDE-FORMOTEROL FUMARATE 160-4.5 MCG/ACT IN AERO: INHALATION_SPRAY | RESPIRATORY_TRACT | Status: AC

## 2016-02-15 MED ORDER — ALBUTEROL SULFATE HFA 108 (90 BASE) MCG/ACT IN AERS: 2.0000 | INHALATION_SPRAY | RESPIRATORY_TRACT | Status: DC | PRN

## 2016-02-15 MED ORDER — ALBUTEROL SULFATE (2.5 MG/3ML) 0.083% IN NEBU: 2.5000 mg | INHALATION_SOLUTION | RESPIRATORY_TRACT | Status: AC | PRN

## 2016-02-15 MED ORDER — MONTELUKAST SODIUM 10 MG PO TABS: 10.0000 mg | ORAL_TABLET | Freq: Every day | ORAL | Status: DC

## 2016-02-15 MED ORDER — FLUTICASONE PROPIONATE 50 MCG/ACT NA SUSP: NASAL | Status: DC

## 2016-02-15 MED ORDER — LEVOCETIRIZINE DIHYDROCHLORIDE 5 MG PO TABS: 5.0000 mg | ORAL_TABLET | Freq: Every evening | ORAL | Status: DC

## 2016-02-15 NOTE — Progress Notes (Signed)
FOLLOW UP NOTE  RE: Gwendolyn PollenGabrielle I Furrow MRN: [id number] DOB: 04/09/1986 [facility] [place] [address] [place] [id number] Date of Office Visit: 02/15/2016  Subjective:  Gwendolyn Clay is a 30 y.o. female who presents today for Asthma  Assessment:   1. History of cough and wheeze, appears consistent with persistent asthma.  2. Incomplete medication adherence and follow-up.   3. Allergic rhinoconjunctivitis.   4.      Tobacco use. Plan:   Meds ordered this encounter  Medications  . albuterol (PROAIR HFA) 108 (90 Base) MCG/ACT inhaler    Sig: Inhale 2 puffs into the lungs every 4 (four) hours as needed for wheezing or shortness of breath.    Dispense:  1 Inhaler    Refill:  1  . levocetirizine (XYZAL) 5 MG tablet    Sig: Take 1 tablet (5 mg total) by mouth every evening.    Dispense:  30 tablet    Refill:  5  . montelukast (SINGULAIR) 10 MG tablet    Sig: Take 1 tablet (10 mg total) by mouth at bedtime.    Dispense:  30 tablet    Refill:  5  . albuterol (PROVENTIL) (2.5 MG/3ML) 0.083% nebulizer solution    Sig: Take 3 mLs (2.5 mg total) by nebulization every 4 (four) hours as needed for wheezing or shortness of breath.    Dispense:  150 mL    Refill:  1  . fluticasone (FLONASE) 50 MCG/ACT nasal spray    Sig: Use 1-2 sprays in the morning    Dispense:  16 g    Refill:  3  . budesonide-formoterol (SYMBICORT) 160-4.5 MCG/ACT inhaler    Sig: 2 puffs twice a day Rinse Gargle and Spit after use    Dispense:  1 Inhaler    Refill:  3  1.  Restart Symbicort 160mcg 2 puffs twice daily, rinse, gargle and spit with water. 2.  Saline nasal wash twice daily. 3.  Xyzal 5mg  once each evening. 4.  Restart Singulair 10mg  each evening. 5.  Flonase 1-2 sprays each nostril once each morning. 6.  ProAir 2 puffs every 4 hours as needed or albuterol neb. 7.  Prednisone 30mg  now and once daily for 3 days and 20mg  on day #4 and 10mg  on last day. 8.   Follow-up in 2 weeks or sooner if needed.  HPI: Gwendolyn Clay returns to the office with report of cough and congestion over the last 48 hours.  She has not been seen in the office since March 2015, unclear why lack of follow-up.  She reports current symptoms of nasal congestion, post-tussive emesis of phlegm, chest congestion/tightness and last night had shortness of breath, therefore used Albuterol neb.  Over the recent days she has been using expired ProAir and Symbicort. She is not aware of any sick contacts, but does note post nasal drip, rhinorrhea, sneezing and itching watery eyes with throat irritation without fever, headache or sore throat.  She had an ED visit in May with respiratory symptoms treated with antibiotics and albuterol.  Denies urgent care visits or prednisone courses.  Denies other new medical concerns, though continues to smoke.  She did have disrupted sleep last night but not usually.  She typically uses meds when she has acute symptoms.  Gwendolyn Clay has a current medication list which includes the following prescription(s): etonogestrel, levothyroxine, albuterol neb.   Drug Allergies: Allergies  Allergen Reactions  . Mold Extract [Trichophyton] Other (See Comments)  Wheezing   . Other     Roaches cause wheezing    Objective:   Filed Vitals:   02/15/16 1513  BP: 114/76  Pulse: 88  Temp: 98.3 F (36.8 C)  Resp: 20   SpO2 Readings from Last 1 Encounters:  02/15/16 96%   Physical Exam  Constitutional: She is well-developed, well-nourished, and in no distress.  HENT:  Head: Atraumatic.  Right Ear: Tympanic membrane and ear canal normal.  Left Ear: Tympanic membrane and ear canal normal.  Nose: Mucosal edema present. No rhinorrhea. No epistaxis.  Mouth/Throat: Oropharynx is clear and moist and mucous membranes are normal. No oropharyngeal exudate, posterior oropharyngeal edema or posterior oropharyngeal erythema.  Neck: Neck supple.  Cardiovascular: Normal rate,  S1 normal and S2 normal.   No murmur heard. Pulmonary/Chest: Effort normal. She has no wheezes. She has no rhonchi. She has no rales.  Post Xopenex/Atrovent neb: Continues to be clear.  Patient without wheeze, rhonchi or crackles.  Lymphadenopathy:    She has no cervical adenopathy.   Diagnostics: Spirometry:  FVC 3.18--89%, FEV1 3.11--104%,FEF 25-75% 4.77--124%; post bronchodilator essentially no change.    Arrington Yohe M. Willa RoughHicks, MD  cc: Geraldo PitterBLAND,VEITA J, MD

## 2016-02-15 NOTE — Patient Instructions (Addendum)
   Restart Symbicort 160mcg 2 puffs twice daily, rinse, gargle and spit with water.  Saline nasal wash twice daily.  Xyzal 5mg  once each evening.  Restart Singulair 10mg  each evening.  Flonase 1-2 sprays each nostril once each morning.  ProAir 2 puffs every 4 hours as needed or albuterol neb.  Prednisone 30mg  now and once daily for 3 days and 20mg  on day #4 and 10mg  on last day.  Follow-up in 2 weeks or sooner if needed.

## 2016-02-16 ENCOUNTER — Telehealth: Payer: Self-pay | Admitting: *Deleted

## 2016-02-16 NOTE — Telephone Encounter (Signed)
Error

## 2016-03-08 ENCOUNTER — Ambulatory Visit: Payer: Self-pay | Admitting: Allergy and Immunology

## 2016-04-11 ENCOUNTER — Ambulatory Visit: Payer: Medicaid Other | Admitting: Obstetrics

## 2016-04-25 ENCOUNTER — Ambulatory Visit: Payer: Self-pay | Admitting: Obstetrics

## 2016-05-11 ENCOUNTER — Ambulatory Visit: Payer: Self-pay | Admitting: Obstetrics

## 2016-05-16 ENCOUNTER — Ambulatory Visit: Payer: Medicaid Other | Admitting: Obstetrics

## 2016-07-19 ENCOUNTER — Encounter (HOSPITAL_COMMUNITY): Payer: Self-pay | Admitting: *Deleted

## 2016-07-19 ENCOUNTER — Emergency Department (HOSPITAL_COMMUNITY)
Admission: EM | Admit: 2016-07-19 | Discharge: 2016-07-19 | Disposition: A | Discharge status: Home / Self Care | Payer: Medicaid Other | Attending: Emergency Medicine | Admitting: Emergency Medicine

## 2016-07-19 DIAGNOSIS — F17210 Nicotine dependence, cigarettes, uncomplicated: Secondary | ICD-10-CM | POA: Insufficient documentation

## 2016-07-19 DIAGNOSIS — Z79899 Other long term (current) drug therapy: Secondary | ICD-10-CM | POA: Insufficient documentation

## 2016-07-19 DIAGNOSIS — E039 Hypothyroidism, unspecified: Secondary | ICD-10-CM | POA: Diagnosis not present

## 2016-07-19 DIAGNOSIS — R197 Diarrhea, unspecified: Secondary | ICD-10-CM | POA: Insufficient documentation

## 2016-07-19 DIAGNOSIS — J45909 Unspecified asthma, uncomplicated: Secondary | ICD-10-CM | POA: Insufficient documentation

## 2016-07-19 DIAGNOSIS — R112 Nausea with vomiting, unspecified: Secondary | ICD-10-CM | POA: Diagnosis present

## 2016-07-19 LAB — CBC WITH DIFFERENTIAL/PLATELET
BASOS PCT: 0 %
Basophils Absolute: 0 10*3/uL (ref 0.0–0.1)
EOS ABS: 0 10*3/uL (ref 0.0–0.7)
Eosinophils Relative: 1 %
HCT: 39.3 % (ref 36.0–46.0)
Hemoglobin: 13.7 g/dL (ref 12.0–15.0)
Lymphocytes Relative: 12 %
Lymphs Abs: 0.6 10*3/uL — ABNORMAL LOW (ref 0.7–4.0)
MCH: 30 pg (ref 26.0–34.0)
MCHC: 34.9 g/dL (ref 30.0–36.0)
MCV: 86 fL (ref 78.0–100.0)
MONO ABS: 0.3 10*3/uL (ref 0.1–1.0)
MONOS PCT: 5 %
NEUTROS PCT: 82 %
Neutro Abs: 4.3 10*3/uL (ref 1.7–7.7)
PLATELETS: 226 10*3/uL (ref 150–400)
RBC: 4.57 MIL/uL (ref 3.87–5.11)
RDW: 13.7 % (ref 11.5–15.5)
WBC: 5.3 10*3/uL (ref 4.0–10.5)

## 2016-07-19 LAB — COMPREHENSIVE METABOLIC PANEL
ALBUMIN: 3.6 g/dL (ref 3.5–5.0)
ALT: 12 U/L — ABNORMAL LOW (ref 14–54)
ANION GAP: 7 (ref 5–15)
AST: 17 U/L (ref 15–41)
Alkaline Phosphatase: 50 U/L (ref 38–126)
BUN: 5 mg/dL — ABNORMAL LOW (ref 6–20)
CO2: 22 mmol/L (ref 22–32)
Calcium: 8.8 mg/dL — ABNORMAL LOW (ref 8.9–10.3)
Chloride: 108 mmol/L (ref 101–111)
Creatinine, Ser: 0.8 mg/dL (ref 0.44–1.00)
GFR calc Af Amer: >60 mL/min (ref >60)
GFR calc non Af Amer: >60 mL/min (ref >60)
GLUCOSE: 84 mg/dL (ref 65–99)
POTASSIUM: 3.7 mmol/L (ref 3.5–5.1)
SODIUM: 137 mmol/L (ref 135–145)
Total Bilirubin: 1.7 mg/dL — ABNORMAL HIGH (ref 0.3–1.2)
Total Protein: 6.7 g/dL (ref 6.5–8.1)

## 2016-07-19 LAB — URINALYSIS, ROUTINE W REFLEX MICROSCOPIC
BILIRUBIN URINE: NEGATIVE
GLUCOSE, UA: NEGATIVE mg/dL
HGB URINE DIPSTICK: NEGATIVE
Ketones, ur: 80 mg/dL — AB
LEUKOCYTES UA: NEGATIVE
NITRITE: NEGATIVE
PROTEIN: 30 mg/dL — AB
SPECIFIC GRAVITY, URINE: 1.026 (ref 1.005–1.030)
pH: 7 (ref 5.0–8.0)

## 2016-07-19 LAB — TSH: TSH: 0.876 u[IU]/mL (ref 0.350–4.500)

## 2016-07-19 LAB — LIPASE, BLOOD: Lipase: 16 U/L (ref 11–51)

## 2016-07-19 LAB — POC URINE PREG, ED: Preg Test, Ur: NEGATIVE

## 2016-07-19 MED ORDER — SODIUM CHLORIDE 0.9 % IV BOLUS (SEPSIS)
1000.0000 mL | Freq: Once | INTRAVENOUS | Status: AC
  Administered 2016-07-19: 1000 mL via INTRAVENOUS

## 2016-07-19 MED ORDER — ONDANSETRON 4 MG PO TBDP: 4.0000 mg | ORAL_TABLET | Freq: Three times a day (TID) | ORAL | 0 refills | Status: DC | PRN

## 2016-07-19 MED ORDER — DICYCLOMINE HCL 10 MG PO CAPS
10.0000 mg | ORAL_CAPSULE | Freq: Once | ORAL | Status: AC
  Administered 2016-07-19: 10 mg via ORAL
  Filled 2016-07-19: qty 1

## 2016-07-19 MED ORDER — ONDANSETRON HCL 4 MG/2ML IJ SOLN
4.0000 mg | Freq: Once | INTRAMUSCULAR | Status: AC
  Administered 2016-07-19: 4 mg via INTRAVENOUS
  Filled 2016-07-19: qty 2

## 2016-07-19 NOTE — ED Provider Notes (Signed)
[facility]-EMERGENCY DEPT Provider Note   CSN: [id number] Arrival date & time: 07/19/16  1550     History   Chief Complaint Chief Complaint  Patient presents with  . Nausea  . Emesis    HPI Gwendolyn Clay is a 30 y.o. female.  HPI Patient is a 30 year old female presents with nausea, vomiting and diarrhea since this morning around 0500. Patient reports she was driving when she had sudden onset of abdominal cramping and nausea. She  has had 6-7 episodes of nonbloody nonbilious vomiting over the past day. She's also had multiple episodes of watery diarrhea. She's been unable to eat or drink anything due to nausea. Reports associated diaphoresis, feeling flushed, and generalized crampy abdominal pain. Denies dysuria, hematuria, abnormal vaginal discharge or other symptoms. Denies sick contacts. Patient received 4 mg of Zofran with EMS prior to arrival with some relief of her nausea. Denies sick contacts, recent travel, recent antibiotics, or suspicious food intake.   Past Medical History:  Diagnosis Date  . Allergic rhinitis    adenoidectomy  . Asthma   . Bacterial infection   . Dysmenorrhea   . Eczema   . Gonorrhea   . Graves' disease   . History of chicken pox   . Hypothyroidism   . Hypothyroidism   . Obese   . Trichomonas     Patient Active Problem List   Diagnosis Date Noted  . Finger joint stiff 01/09/2016  . Displaced fracture of base of fifth metacarpal bone of right hand with routine healing 11/21/2015  . Hand discomfort 11/21/2015  . NSVD (normal spontaneous vaginal delivery) 09/08/2014  . Normal labor and delivery 09/07/2014  . 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 Gestation period, 33 weeks 07/28/2014  . Currently pregnant 07/28/2014  . Nausea with vomiting 02/13/2014  . Back pain 08/02/2012  . Status asthmaticus 08/02/2012  . Allergic rhinitis   . Eczema   . Asthma   . Hypothyroidism   . Obese     Past Surgical History:  Procedure Laterality Date  .  ADENOIDECTOMY    . CLOSED REDUCTION METACARPAL WITH PERCUTANEOUS PINNING Right 10/13/2015   Procedure: CLOSED REDUCTION PERCUTANEOUS PINNING RIGHT SMALL METACARPAL FRACTURE ;  Surgeon: Betha Loa, MD;  Location: [facility];  Service: Orthopedics;  Laterality: Right;  . WISDOM TOOTH EXTRACTION      OB History    Gravida Para Term Preterm AB Living   2 2 2     2    SAB TAB Ectopic Multiple Live Births         0 2       Home Medications    Prior to Admission medications   Medication Sig Start Date End Date Taking? Authorizing Provider  albuterol (PROAIR HFA) 108 (90 Base) MCG/ACT inhaler Inhale 2 puffs into the lungs every 4 (four) hours as needed for wheezing or shortness of breath. 02/15/16   Roselyn Kara Mead, MD  albuterol (PROVENTIL) (2.5 MG/3ML) 0.083% nebulizer solution Take 3 mLs (2.5 mg total) by nebulization every 4 (four) hours as needed for wheezing or shortness of breath. 02/15/16   Roselyn Kara Mead, MD  benzonatate (TESSALON) 100 MG capsule Take 1 capsule (100 mg total) by mouth every 8 (eight) hours. Patient not taking: Reported on 01/11/2016 01/05/16   Emi Holes, PA-C  budesonide-formoterol [facility]) 160-4.5 MCG/ACT inhaler 2 puffs twice a day Rinse Gargle and Spit after use 02/15/16   Baxter Hire, MD  etonogestrel (NEXPLANON) 68 MG IMPL implant 1 each  by Subdermal route once.    Historical Provider, MD  fluticasone Aleda Grana(FLONASE) 50 MCG/ACT nasal spray Use 1-2 sprays in the morning 02/15/16   Roselyn Kara MeadM Hicks, MD  levocetirizine (XYZAL) 5 MG tablet Take 1 tablet (5 mg total) by mouth every evening. 02/15/16   Roselyn Kara MeadM Hicks, MD  levothyroxine (SYNTHROID, LEVOTHROID) 175 MCG tablet TAKE 1 TABLET BY MOUTH DAILY BEFORE BREAKFAST. 02/06/16   Brock Badharles A Harper, MD  montelukast (SINGULAIR) 10 MG tablet Take 1 tablet (10 mg total) by mouth at bedtime. 02/15/16   Roselyn Kara MeadM Hicks, MD    Family History Family History  Problem Relation Age of Onset  . Arthritis Maternal Aunt     . Diabetes Maternal Uncle   . Hypertension Maternal Grandmother   . Cancer Maternal Grandfather     PROSTATE  . Asthma      neices and nephews  . Eczema      neices and nephews  . Allergic rhinitis Brother   . Eczema Daughter   . Diabetes Father     Social History Social History  Substance Use Topics  . Smoking status: Current Every Day Smoker    Packs/day: 0.20    Types: Cigarettes    Last attempt to quit: 03/30/2014  . Smokeless tobacco: Never Used     Comment: smoke 5-6 cigarettes daily  . Alcohol use Yes     Comment: occasional     Allergies   Mold extract [trichophyton] and Other   Review of Systems Review of Systems  Constitutional: Positive for chills, fatigue (subjective) and fever.  HENT: Negative for ear pain and sore throat.   Eyes: Negative for pain and visual disturbance.  Respiratory: Negative for cough and shortness of breath.   Cardiovascular: Negative for chest pain and palpitations.  Gastrointestinal: Positive for abdominal pain, diarrhea, nausea and vomiting.  Genitourinary: Negative for dysuria and hematuria.  Musculoskeletal: Negative for arthralgias and back pain.  Skin: Negative for color change and rash.  Neurological: Negative for seizures, syncope, light-headedness and headaches.  All other systems reviewed and are negative.    Physical Exam Updated Vital Signs BP 109/70   Pulse 62   Temp 98 F (36.7 C) (Oral)   Ht 5\' 10"  (1.778 m)   Wt 99.8 kg   SpO2 100%   BMI 31.57 kg/m   Physical Exam  Constitutional: She appears well-developed and well-nourished. No distress.  HENT:  Head: Normocephalic and atraumatic.  Eyes: Conjunctivae are normal.  Neck: Neck supple.  Cardiovascular: Normal rate and regular rhythm.   No murmur heard. Pulmonary/Chest: Effort normal and breath sounds normal. No respiratory distress.  Abdominal: Soft. She exhibits no distension. There is no tenderness.  Musculoskeletal: She exhibits no edema.   Neurological: She is alert.  Skin: Skin is warm and dry.  Psychiatric: She has a normal mood and affect.  Nursing note and vitals reviewed.    ED Treatments / Results  Labs (all labs ordered are listed, but only abnormal results are displayed) Labs Reviewed  CBC WITH DIFFERENTIAL/PLATELET - Abnormal; Notable for the following:       Result Value   Lymphs Abs 0.6 (*)    All other components within normal limits  COMPREHENSIVE METABOLIC PANEL - Abnormal; Notable for the following:    BUN 5 (*)    Calcium 8.8 (*)    ALT 12 (*)    Total Bilirubin 1.7 (*)    All other components within normal limits  URINALYSIS, ROUTINE W REFLEX  MICROSCOPIC - Abnormal; Notable for the following:    APPearance HAZY (*)    Ketones, ur 80 (*)    Protein, ur 30 (*)    Bacteria, UA RARE (*)    Squamous Epithelial / LPF 0-5 (*)    All other components within normal limits  LIPASE, BLOOD  TSH  POC URINE PREG, ED    EKG  EKG Interpretation None       Radiology No results found.  Procedures Procedures (including critical care time)  Medications Ordered in ED Medications - No data to display   Initial Impression / Assessment and Plan / ED Course  I have reviewed the triage vital signs and the nursing notes.  Pertinent labs & imaging results that were available during my care of the patient were reviewed by me and considered in my medical decision making (see chart for details).  Clinical Course    Patient is a 30 year old female who presents with sudden onset of nausea, vomiting and diarrhea since this morning. She is afebrile and normotensive on exam. Abdominal exam is benign. Patient continues to report ongoing nausea despite Zofran. IV fluids and antiemetics given. Bentyl given for abdominal discomfort. Bilirubin is mildly elevated. CBC, CMP, lipase otherwise unremarkable. UA is concentrated without signs of infection or stone. UPT is negative. Due to unremarkable labs and benign exam,  doubt intra-abdominal pathology such as appendicitis, pancreatitis, cholecystitis, UTI or pyelonephritis.  No fevers, leukocytosis, bloody stools recent antibiotics or other alarm symptoms. Symptoms likely secondary to viral gastroenteritis.  On reevaluation after symptomatic treatment patient reports she is significantly improved. She is hungry and tolerating by mouth fluids. She was discharged in stable condition. Will follow up with primary care doctor as needed. Rx small amount of Zofran given. Strict abdominal pain return precautions were discussed and patient voices understanding and agreement with plan.  Patient care supervised by Dr. Jacqulyn BathLong, ED attending  Final Clinical Impressions(s) / ED Diagnoses   Final diagnoses:  Nausea vomiting and diarrhea    New Prescriptions New Prescriptions   No medications on file     Isa RankinAnn B Consuela Widener, MD 07/20/16 0122    Maia PlanJoshua G Long, MD 07/20/16 [phone]

## 2016-07-19 NOTE — ED Triage Notes (Signed)
[facility] for evaluation of nausea, vomiting, and diarrhea that began this morning at 5 am. Received 4 mg zofran in route. VSS.

## 2016-07-23 ENCOUNTER — Ambulatory Visit: Payer: Medicaid Other | Admitting: Obstetrics

## 2016-08-08 ENCOUNTER — Encounter: Payer: Self-pay | Admitting: Obstetrics

## 2016-08-08 ENCOUNTER — Ambulatory Visit (INDEPENDENT_AMBULATORY_CARE_PROVIDER_SITE_OTHER): Payer: Medicaid Other | Admitting: Obstetrics

## 2016-08-08 ENCOUNTER — Other Ambulatory Visit (HOSPITAL_COMMUNITY)
Admission: RE | Admit: 2016-08-08 | Discharge: 2016-08-08 | Disposition: A | Discharge status: Home / Self Care | Payer: Medicaid Other | Source: Ambulatory Visit | Attending: Obstetrics | Admitting: Obstetrics

## 2016-08-08 VITALS — BP 103/76 | HR 75 | Temp 98.8°F | Wt 208.8 lb

## 2016-08-08 DIAGNOSIS — A6004 Herpesviral vulvovaginitis: Secondary | ICD-10-CM

## 2016-08-08 DIAGNOSIS — A549 Gonococcal infection, unspecified: Secondary | ICD-10-CM | POA: Diagnosis not present

## 2016-08-08 DIAGNOSIS — K641 Second degree hemorrhoids: Secondary | ICD-10-CM | POA: Diagnosis not present

## 2016-08-08 DIAGNOSIS — Z975 Presence of (intrauterine) contraceptive device: Secondary | ICD-10-CM | POA: Diagnosis not present

## 2016-08-08 MED ORDER — VALACYCLOVIR HCL 1 G PO TABS: ORAL_TABLET | ORAL | 99 refills | Status: DC

## 2016-08-08 NOTE — Progress Notes (Signed)
Patient ID: Gwendolyn Clay, female   DOB: 28-Feb-1986, 30 y.o.   MRN: [id number]  Chief Complaint  Patient presents with  . Follow-up    TOC    HPI Gwendolyn Clay is a 30 y.o. female.  History of positive GC, treated.  Presents today for TOC.  Also c/o worsening hemorrhoidal discomfort.  Requests referral to General Surgery. HPI  Past Medical History:  Diagnosis Date  . Allergic rhinitis    adenoidectomy  . Asthma   . Bacterial infection   . Dysmenorrhea   . Eczema   . Gonorrhea   . Graves' disease   . History of chicken pox   . Hypothyroidism   . Hypothyroidism   . Obese   . Trichomonas     Past Surgical History:  Procedure Laterality Date  . ADENOIDECTOMY    . CLOSED REDUCTION METACARPAL WITH PERCUTANEOUS PINNING Right 10/13/2015   Procedure: CLOSED REDUCTION PERCUTANEOUS PINNING RIGHT SMALL METACARPAL FRACTURE ;  Surgeon: Betha LoaKevin Kuzma, MD;  Location: [facility];  Service: Orthopedics;  Laterality: Right;  . WISDOM TOOTH EXTRACTION      Family History  Problem Relation Age of Onset  . Arthritis Maternal Aunt   . Diabetes Maternal Uncle   . Hypertension Maternal Grandmother   . Cancer Maternal Grandfather     PROSTATE  . Asthma      neices and nephews  . Eczema      neices and nephews  . Allergic rhinitis Brother   . Eczema Daughter   . Diabetes Father     Social History Social History  Substance Use Topics  . Smoking status: Current Every Day Smoker    Packs/day: 0.20    Types: Cigarettes    Last attempt to quit: 03/30/2014  . Smokeless tobacco: Never Used     Comment: smoke 5-6 cigarettes daily  . Alcohol use Yes     Comment: occasional    Allergies  Allergen Reactions  . Mold Extract [Trichophyton] Other (See Comments)    Wheezing   . Other     Roaches cause wheezing     Current Outpatient Prescriptions  Medication Sig Dispense Refill  . albuterol (PROAIR HFA) 108 (90 Base) MCG/ACT inhaler Inhale 2 puffs into the lungs  every 4 (four) hours as needed for wheezing or shortness of breath. 1 Inhaler 1  . albuterol (PROVENTIL) (2.5 MG/3ML) 0.083% nebulizer solution Take 3 mLs (2.5 mg total) by nebulization every 4 (four) hours as needed for wheezing or shortness of breath. 150 mL 1  . budesonide-formoterol (SYMBICORT) 160-4.5 MCG/ACT inhaler 2 puffs twice a day Rinse Gargle and Spit after use (Patient taking differently: daily as needed. 2 puffs twice a day Rinse Gargle and Spit after use) 1 Inhaler 3  . etonogestrel (NEXPLANON) 68 MG IMPL implant 1 each by Subdermal route once.    . fluticasone (FLONASE) 50 MCG/ACT nasal spray Use 1-2 sprays in the morning 16 g 3  . levothyroxine (SYNTHROID, LEVOTHROID) 175 MCG tablet TAKE 1 TABLET BY MOUTH DAILY BEFORE BREAKFAST. 30 tablet 2  . montelukast (SINGULAIR) 10 MG tablet Take 1 tablet (10 mg total) by mouth at bedtime. 30 tablet 5  . ondansetron (ZOFRAN ODT) 4 MG disintegrating tablet Take 1 tablet (4 mg total) by mouth every 8 (eight) hours as needed for nausea or vomiting. 9 tablet 0  . benzonatate (TESSALON) 100 MG capsule Take 1 capsule (100 mg total) by mouth every 8 (eight) hours. (Patient not taking: Reported  on 08/08/2016) 21 capsule 0  . levocetirizine (XYZAL) 5 MG tablet Take 1 tablet (5 mg total) by mouth every evening. (Patient not taking: Reported on 08/08/2016) 30 tablet 5  . valACYclovir (VALTREX) 1000 MG tablet Take 1 tablet po bid x 3 days prn. 30 tablet prn   No current facility-administered medications for this visit.     Review of Systems Review of Systems Constitutional: negative for fatigue and weight loss Respiratory: negative for cough and wheezing Cardiovascular: negative for chest pain, fatigue and palpitations Gastrointestinal: positive for hemorrhoidal pain Genitourinary:negative Integument/breast: negative for nipple discharge Musculoskeletal:negative for myalgias Neurological: negative for gait problems and tremors Behavioral/Psych:  negative for abusive relationship, depression Endocrine: negative for temperature intolerance      Blood pressure 103/76, pulse 75, temperature 98.8 F (37.1 C), temperature source Oral, weight 208 lb 12.8 oz (94.7 kg), last menstrual period 07/23/2016, not currently breastfeeding.  Physical Exam Physical Exam General:   alert  Skin:   no rash or abnormalities  Lungs:   clear to auscultation bilaterally  Heart:   regular rate and rhythm, S1, S2 normal, no murmur, click, rub or gallop  Breasts:   normal without suspicious masses, skin or nipple changes or axillary nodes  Abdomen:  normal findings: no organomegaly, soft, non-tender and no hernia  Pelvis:  External genitalia: normal general appearance Urinary system: urethral meatus normal and bladder without fullness, nontender Vaginal: normal without tenderness, induration or masses Cervix: normal appearance Adnexa: normal bimanual exam Uterus: anteverted and non-tender, normal size Anal exam:  External hemorrhoids    50% of 15 min visit spent on counseling and coordination of care.    Data Reviewed Cultures Wet prep  Assessment     History of positive Chlamydia STD TOC    Plan    Wet prep and cultures done STD panel drawn Follow up in 3 months for pap   Orders Placed This Encounter  Procedures  . GC/Chlamydia Probe Amp  . Ambulatory referral to General Surgery    Referral Priority:   Routine    Referral Type:   Surgical    Referral Reason:   Specialty Services Required    Requested Specialty:   General Surgery    Number of Visits Requested:   1   Meds ordered this encounter  Medications  . valACYclovir (VALTREX) 1000 MG tablet    Sig: Take 1 tablet po bid x 3 days prn.    Dispense:  30 tablet    Refill:  prn

## 2016-08-09 ENCOUNTER — Telehealth: Payer: Self-pay

## 2016-08-09 LAB — GC/CHLAMYDIA PROBE AMP (~~LOC~~) NOT AT ARMC
Chlamydia: NEGATIVE
Neisseria Gonorrhea: NEGATIVE

## 2016-08-09 NOTE — Telephone Encounter (Signed)
Left pt vmail asking her to call office.  Let patient know that she has an appt scheduled with [facility] on 08/17/2016 at 1:30 with Dr Johna SheriffHoxworth.  Also let patient know that she needs to arrive at 1pm

## 2016-09-01 ENCOUNTER — Emergency Department (HOSPITAL_COMMUNITY)
Admission: EM | Admit: 2016-09-01 | Discharge: 2016-09-01 | Disposition: A | Discharge status: Home / Self Care | Payer: Medicaid Other | Attending: Emergency Medicine | Admitting: Emergency Medicine

## 2016-09-01 ENCOUNTER — Encounter (HOSPITAL_COMMUNITY): Payer: Self-pay | Admitting: Emergency Medicine

## 2016-09-01 ENCOUNTER — Emergency Department (HOSPITAL_COMMUNITY): Payer: Medicaid Other

## 2016-09-01 DIAGNOSIS — F17210 Nicotine dependence, cigarettes, uncomplicated: Secondary | ICD-10-CM | POA: Diagnosis not present

## 2016-09-01 DIAGNOSIS — J45909 Unspecified asthma, uncomplicated: Secondary | ICD-10-CM | POA: Insufficient documentation

## 2016-09-01 DIAGNOSIS — Z5321 Procedure and treatment not carried out due to patient leaving prior to being seen by health care provider: Secondary | ICD-10-CM | POA: Insufficient documentation

## 2016-09-01 DIAGNOSIS — E039 Hypothyroidism, unspecified: Secondary | ICD-10-CM | POA: Diagnosis not present

## 2016-09-01 DIAGNOSIS — Z79899 Other long term (current) drug therapy: Secondary | ICD-10-CM | POA: Insufficient documentation

## 2016-09-01 MED ORDER — ALBUTEROL SULFATE (2.5 MG/3ML) 0.083% IN NEBU
5.0000 mg | INHALATION_SOLUTION | Freq: Once | RESPIRATORY_TRACT | Status: AC
  Administered 2016-09-01: 5 mg via RESPIRATORY_TRACT
  Filled 2016-09-01: qty 6

## 2016-09-01 NOTE — ED Triage Notes (Signed)
Patient reports wheezing , dry cough , chest congestion onset yesterday unrelieved by inhaler , denies fever or chills .

## 2016-09-01 NOTE — ED Notes (Signed)
Patient states that she is catching a cab and going home, encouraged patient to stay

## 2016-09-02 ENCOUNTER — Emergency Department (HOSPITAL_COMMUNITY)
Admission: EM | Admit: 2016-09-02 | Discharge: 2016-09-03 | Disposition: A | Discharge status: Home / Self Care | Payer: Medicaid Other | Source: Home / Self Care | Attending: Emergency Medicine | Admitting: Emergency Medicine

## 2016-09-02 ENCOUNTER — Emergency Department (HOSPITAL_COMMUNITY): Payer: Medicaid Other

## 2016-09-02 ENCOUNTER — Encounter (HOSPITAL_COMMUNITY): Payer: Self-pay | Admitting: Emergency Medicine

## 2016-09-02 DIAGNOSIS — E872 Acidosis: Secondary | ICD-10-CM | POA: Diagnosis not present

## 2016-09-02 DIAGNOSIS — J45909 Unspecified asthma, uncomplicated: Secondary | ICD-10-CM

## 2016-09-02 DIAGNOSIS — F17210 Nicotine dependence, cigarettes, uncomplicated: Secondary | ICD-10-CM | POA: Insufficient documentation

## 2016-09-02 DIAGNOSIS — D72819 Decreased white blood cell count, unspecified: Secondary | ICD-10-CM | POA: Diagnosis not present

## 2016-09-02 DIAGNOSIS — K5900 Constipation, unspecified: Secondary | ICD-10-CM | POA: Diagnosis not present

## 2016-09-02 DIAGNOSIS — E039 Hypothyroidism, unspecified: Secondary | ICD-10-CM

## 2016-09-02 DIAGNOSIS — J181 Lobar pneumonia, unspecified organism: Principal | ICD-10-CM

## 2016-09-02 DIAGNOSIS — J189 Pneumonia, unspecified organism: Secondary | ICD-10-CM

## 2016-09-02 DIAGNOSIS — D649 Anemia, unspecified: Secondary | ICD-10-CM | POA: Diagnosis not present

## 2016-09-02 DIAGNOSIS — E0500 Thyrotoxicosis with diffuse goiter without thyrotoxic crisis or storm: Secondary | ICD-10-CM | POA: Diagnosis not present

## 2016-09-02 DIAGNOSIS — J4522 Mild intermittent asthma with status asthmaticus: Secondary | ICD-10-CM | POA: Diagnosis not present

## 2016-09-02 DIAGNOSIS — J309 Allergic rhinitis, unspecified: Secondary | ICD-10-CM | POA: Diagnosis not present

## 2016-09-02 DIAGNOSIS — Z79899 Other long term (current) drug therapy: Secondary | ICD-10-CM

## 2016-09-02 DIAGNOSIS — N946 Dysmenorrhea, unspecified: Secondary | ICD-10-CM | POA: Diagnosis not present

## 2016-09-02 DIAGNOSIS — Z6829 Body mass index (BMI) 29.0-29.9, adult: Secondary | ICD-10-CM | POA: Diagnosis not present

## 2016-09-02 DIAGNOSIS — J1000 Influenza due to other identified influenza virus with unspecified type of pneumonia: Secondary | ICD-10-CM | POA: Diagnosis not present

## 2016-09-02 DIAGNOSIS — R112 Nausea with vomiting, unspecified: Secondary | ICD-10-CM | POA: Diagnosis present

## 2016-09-02 DIAGNOSIS — Z841 Family history of disorders of kidney and ureter: Secondary | ICD-10-CM | POA: Diagnosis not present

## 2016-09-02 DIAGNOSIS — Z8701 Personal history of pneumonia (recurrent): Secondary | ICD-10-CM

## 2016-09-02 DIAGNOSIS — E669 Obesity, unspecified: Secondary | ICD-10-CM | POA: Diagnosis not present

## 2016-09-02 DIAGNOSIS — D61818 Other pancytopenia: Secondary | ICD-10-CM | POA: Diagnosis not present

## 2016-09-02 DIAGNOSIS — Z9109 Other allergy status, other than to drugs and biological substances: Secondary | ICD-10-CM | POA: Diagnosis not present

## 2016-09-02 HISTORY — DX: Personal history of pneumonia (recurrent): Z87.01

## 2016-09-02 LAB — CBC WITH DIFFERENTIAL/PLATELET
BASOS ABS: 0 10*3/uL (ref 0.0–0.1)
Basophils Relative: 0 %
Eosinophils Absolute: 0 10*3/uL (ref 0.0–0.7)
Eosinophils Relative: 0 %
HEMATOCRIT: 33.8 % — AB (ref 36.0–46.0)
Hemoglobin: 11.5 g/dL — ABNORMAL LOW (ref 12.0–15.0)
LYMPHS ABS: 0.4 10*3/uL — AB (ref 0.7–4.0)
LYMPHS PCT: 14 %
MCH: 28.8 pg (ref 26.0–34.0)
MCHC: 34 g/dL (ref 30.0–36.0)
MCV: 84.5 fL (ref 78.0–100.0)
MONO ABS: 0.1 10*3/uL (ref 0.1–1.0)
Monocytes Relative: 3 %
Neutro Abs: 2.3 10*3/uL (ref 1.7–7.7)
Neutrophils Relative %: 83 %
Platelets: 169 10*3/uL (ref 150–400)
RBC: 4 MIL/uL (ref 3.87–5.11)
RDW: 14.1 % (ref 11.5–15.5)
WBC: 2.8 10*3/uL — ABNORMAL LOW (ref 4.0–10.5)

## 2016-09-02 LAB — COMPREHENSIVE METABOLIC PANEL
ALT: 23 U/L (ref 14–54)
AST: 50 U/L — AB (ref 15–41)
Albumin: 3.1 g/dL — ABNORMAL LOW (ref 3.5–5.0)
Alkaline Phosphatase: 48 U/L (ref 38–126)
Anion gap: 6 (ref 5–15)
BILIRUBIN TOTAL: 1.1 mg/dL (ref 0.3–1.2)
BUN: 6 mg/dL (ref 6–20)
CO2: 21 mmol/L — ABNORMAL LOW (ref 22–32)
CREATININE: 0.75 mg/dL (ref 0.44–1.00)
Calcium: 8 mg/dL — ABNORMAL LOW (ref 8.9–10.3)
Chloride: 110 mmol/L (ref 101–111)
GFR calc Af Amer: >60 mL/min (ref >60)
Glucose, Bld: 86 mg/dL (ref 65–99)
POTASSIUM: 3.5 mmol/L (ref 3.5–5.1)
Sodium: 137 mmol/L (ref 135–145)
TOTAL PROTEIN: 6.1 g/dL — AB (ref 6.5–8.1)

## 2016-09-02 LAB — URINALYSIS, ROUTINE W REFLEX MICROSCOPIC
Bacteria, UA: NONE SEEN
Bilirubin Urine: NEGATIVE
GLUCOSE, UA: NEGATIVE mg/dL
Ketones, ur: 80 mg/dL — AB
Leukocytes, UA: NEGATIVE
Nitrite: NEGATIVE
PH: 5 (ref 5.0–8.0)
Protein, ur: 100 mg/dL — AB
SPECIFIC GRAVITY, URINE: 1.029 (ref 1.005–1.030)

## 2016-09-02 LAB — HCG, QUANTITATIVE, PREGNANCY: hCG, Beta Chain, Quant, S: <1 m[IU]/mL (ref <5)

## 2016-09-02 MED ORDER — SODIUM CHLORIDE 0.9 % IV BOLUS (SEPSIS)
1000.0000 mL | Freq: Once | INTRAVENOUS | Status: AC
  Administered 2016-09-02: 1000 mL via INTRAVENOUS

## 2016-09-02 MED ORDER — ONDANSETRON HCL 4 MG/2ML IJ SOLN
4.0000 mg | Freq: Once | INTRAMUSCULAR | Status: AC
  Administered 2016-09-02: 4 mg via INTRAVENOUS
  Filled 2016-09-02: qty 2

## 2016-09-02 MED ORDER — ONDANSETRON HCL 4 MG/2ML IJ SOLN
4.0000 mg | Freq: Once | INTRAMUSCULAR | Status: AC
  Administered 2016-09-02: 4 mg via INTRAMUSCULAR
  Filled 2016-09-02: qty 2

## 2016-09-02 MED ORDER — ACETAMINOPHEN 500 MG PO TABS
1000.0000 mg | ORAL_TABLET | Freq: Once | ORAL | Status: AC
  Administered 2016-09-02: 1000 mg via ORAL
  Filled 2016-09-02: qty 2

## 2016-09-02 MED ORDER — DEXTROSE 5 % IV SOLN
1.0000 g | Freq: Once | INTRAVENOUS | Status: AC
  Administered 2016-09-02: 1 g via INTRAVENOUS
  Filled 2016-09-02: qty 10

## 2016-09-02 MED ORDER — AZITHROMYCIN 250 MG PO TABS
1000.0000 mg | ORAL_TABLET | Freq: Once | ORAL | Status: AC
  Administered 2016-09-02: 1000 mg via ORAL
  Filled 2016-09-02: qty 4

## 2016-09-02 NOTE — ED Notes (Signed)
Bed: WA03 Expected date:  Expected time:  Means of arrival:  Comments: 31 yo N/V

## 2016-09-02 NOTE — ED Triage Notes (Signed)
Pt from home via EMS c/o N/V x2 days. Pt denies cough and diarrhea. Pt adds that she has not had BM x3 days. Pt received 1g PO tylenol, 800 cc NS, 100 mcg Fentanyl and 4 zofran en route. Pt now rates pain 5/10. Pt family is sick with the same. Pt is A&O and in NAD.

## 2016-09-02 NOTE — ED Notes (Signed)
Patient too dizzy from meds to walk. Patient will try to give urine sample at a later time.

## 2016-09-02 NOTE — ED Notes (Signed)
Patient stated that she still can not give urine sample at this time. Will call out when ready.

## 2016-09-02 NOTE — ED Provider Notes (Signed)
[facility]-EMERGENCY DEPT Provider Note   CSN: [id number] Arrival date & time: 09/02/16  1532     History   Chief Complaint Chief Complaint  Patient presents with  . Nausea  . Emesis    HPI Gwendolyn Clay is a 31 y.o. female.  The history is provided by the patient. No language interpreter was used.  Emesis   This is a new problem. The current episode started 2 days ago. The problem has been gradually worsening. There has been no fever. Associated symptoms include abdominal pain, cough and URI. Pertinent negatives include no diarrhea. Risk factors include ill contacts.  Pt complains of nausea and vomiting.  Pt called [facility] today.  Pt was given fentanyl and zofran by EMS.  Pt sleeping on evaluation  Pt reevaluated.  Pt reports coughing.  Pt complains of soreness in her chest.  Pt was seen at [facility] yesterday for concern of pneumonia.  Chest xray showed possible pneumonia Past Medical History:  Diagnosis Date  . Allergic rhinitis    adenoidectomy  . Asthma   . Bacterial infection   . Dysmenorrhea   . Eczema   . Gonorrhea   . Graves disease   . Graves' disease   . History of chicken pox   . Hypothyroidism   . Hypothyroidism   . Obese   . Trichomonas     Patient Active Problem List   Diagnosis Date Noted  . Finger joint stiff 01/09/2016  . Displaced fracture of base of fifth metacarpal bone of right hand with routine healing 11/21/2015  . Hand discomfort 11/21/2015  . NSVD (normal spontaneous vaginal delivery) 09/08/2014  . Normal labor and delivery 09/07/2014  . 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 Gestation period, 33 weeks 07/28/2014  . Currently pregnant 07/28/2014  . Nausea with vomiting 02/13/2014  . Back pain 08/02/2012  . Status asthmaticus 08/02/2012  . Allergic rhinitis   . Eczema   . Asthma   . Hypothyroidism   . Obese     Past Surgical History:  Procedure Laterality Date  . ADENOIDECTOMY    . CLOSED REDUCTION METACARPAL WITH PERCUTANEOUS PINNING Right  10/13/2015   Procedure: CLOSED REDUCTION PERCUTANEOUS PINNING RIGHT SMALL METACARPAL FRACTURE ;  Surgeon: Betha Loa, MD;  Location: [facility];  Service: Orthopedics;  Laterality: Right;  . WISDOM TOOTH EXTRACTION      OB History    Gravida Para Term Preterm AB Living   2 2 2     2    SAB TAB Ectopic Multiple Live Births         0 2       Home Medications    Prior to Admission medications   Medication Sig Start Date End Date Taking? Authorizing Provider  albuterol (PROAIR HFA) 108 (90 Base) MCG/ACT inhaler Inhale 2 puffs into the lungs every 4 (four) hours as needed for wheezing or shortness of breath. 02/15/16   Roselyn Kara Mead, MD  albuterol (PROVENTIL) (2.5 MG/3ML) 0.083% nebulizer solution Take 3 mLs (2.5 mg total) by nebulization every 4 (four) hours as needed for wheezing or shortness of breath. 02/15/16   Roselyn Kara Mead, MD  benzonatate (TESSALON) 100 MG capsule Take 1 capsule (100 mg total) by mouth every 8 (eight) hours. Patient not taking: Reported on 08/08/2016 01/05/16   Emi Holes, PA-C  budesonide-formoterol [facility]) 160-4.5 MCG/ACT inhaler 2 puffs twice a day Rinse Gargle and Spit after use Patient taking differently: daily as needed. 2 puffs twice a day Rinse Gargle  and Spit after use 02/15/16   Baxter Hire, MD  etonogestrel (NEXPLANON) 68 MG IMPL implant 1 each by Subdermal route once.    Historical Provider, MD  fluticasone Aleda Grana) 50 MCG/ACT nasal spray Use 1-2 sprays in the morning 02/15/16   Roselyn Kara Mead, MD  levocetirizine (XYZAL) 5 MG tablet Take 1 tablet (5 mg total) by mouth every evening. Patient not taking: Reported on 08/08/2016 02/15/16   Baxter Hire, MD  levothyroxine (SYNTHROID, LEVOTHROID) 175 MCG tablet TAKE 1 TABLET BY MOUTH DAILY BEFORE BREAKFAST. 02/06/16   Brock Bad, MD  montelukast (SINGULAIR) 10 MG tablet Take 1 tablet (10 mg total) by mouth at bedtime. 02/15/16   Roselyn Kara Mead, MD  ondansetron (ZOFRAN ODT) 4 MG  disintegrating tablet Take 1 tablet (4 mg total) by mouth every 8 (eight) hours as needed for nausea or vomiting. 07/19/16   Isa Rankin, MD  valACYclovir (VALTREX) 1000 MG tablet Take 1 tablet po bid x 3 days prn. 08/08/16   Brock Bad, MD    Family History Family History  Problem Relation Age of Onset  . Arthritis Maternal Aunt   . Diabetes Maternal Uncle   . Hypertension Maternal Grandmother   . Cancer Maternal Grandfather     PROSTATE  . Asthma      neices and nephews  . Eczema      neices and nephews  . Allergic rhinitis Brother   . Eczema Daughter   . Diabetes Father     Social History Social History  Substance Use Topics  . Smoking status: Current Every Day Smoker    Packs/day: 0.20    Types: Cigarettes    Last attempt to quit: 03/30/2014  . Smokeless tobacco: Never Used     Comment: smoke 5-6 cigarettes daily  . Alcohol use Yes     Comment: occasional     Allergies   Mold extract [trichophyton] and Other   Review of Systems Review of Systems  Respiratory: Positive for cough.   Gastrointestinal: Positive for abdominal pain and vomiting. Negative for diarrhea.  All other systems reviewed and are negative.    Physical Exam Updated Vital Signs BP 110/78   Pulse 67   Temp 99 F (37.2 C) (Oral) Comment: tylenol prior  Resp 18   LMP 08/21/2016 (Approximate)   SpO2 100%   Physical Exam  Constitutional: She is oriented to person, place, and time. She appears well-developed and well-nourished.  HENT:  Head: Normocephalic.  Right Ear: External ear normal.  Left Ear: External ear normal.  Mouth/Throat: Oropharynx is clear and moist.  Eyes: EOM are normal.  Neck: Normal range of motion.  Pulmonary/Chest: Effort normal.  Abdominal: Soft. Bowel sounds are normal. She exhibits no distension. There is no tenderness.  Musculoskeletal: Normal range of motion.  Neurological: She is alert and oriented to person, place, and time.  Skin: Skin is warm.    Psychiatric: She has a normal mood and affect.  Nursing note and vitals reviewed.    ED Treatments / Results  Labs (all labs ordered are listed, but only abnormal results are displayed) Labs Reviewed  CBC WITH DIFFERENTIAL/PLATELET - Abnormal; Notable for the following:       Result Value   WBC 2.8 (*)    Hemoglobin 11.5 (*)    HCT 33.8 (*)    Lymphs Abs 0.4 (*)    All other components within normal limits  COMPREHENSIVE METABOLIC PANEL - Abnormal; Notable for the  following:    CO2 21 (*)    Calcium 8.0 (*)    Total Protein 6.1 (*)    Albumin 3.1 (*)    AST 50 (*)    All other components within normal limits  HCG, QUANTITATIVE, PREGNANCY  URINALYSIS, ROUTINE W REFLEX MICROSCOPIC    EKG  EKG Interpretation None       Radiology Dg Chest 2 View  Result Date: 09/01/2016 CLINICAL DATA:  31 y/o F; persisting cough, wheezing, and congestion. EXAM: CHEST  2 VIEW COMPARISON:  01/05/2016 chest radiograph FINDINGS: Stable normal cardiac silhouette. Mild bronchitic markings. On the lateral view there are rounded opacities projecting over superior segments of lower lobes. No acute osseous abnormality. No pleural effusion or pneumothorax. IMPRESSION: Mild bronchitic markings. Rounded opacities project over superior segments of lower lobes and may represent areas of atelectasis or developing pneumonia. Follow-up radiographs after appropriate therapy is recommended to ensure resolution. Electronically Signed   By: Mitzi HansenLance  Furusawa-Stratton M.D.   On: 09/01/2016 22:49    Procedures Procedures (including critical care time)  Medications Ordered in ED Medications  sodium chloride 0.9 % bolus 1,000 mL (1,000 mLs Intravenous New Bag/Given 09/02/16 1730)  ondansetron (ZOFRAN) injection 4 mg (4 mg Intramuscular Given 09/02/16 1730)     Initial Impression / Assessment and Plan / ED Course  I have reviewed the triage vital signs and the nursing notes.  Pertinent labs & imaging results that  were available during my care of the patient were reviewed by me and considered in my medical decision making (see chart for details).   Chest xray yesterday shows possible pneumonia.  Rocephin and zithromax ordered.      Final Clinical Impressions(s) / ED Diagnoses   Final diagnoses:  Community acquired pneumonia of right upper lobe of lung (HCC)    New Prescriptions New Prescriptions   No medications on file  Pt's care turned over at 9pm to oncoming provider, chest xray pending   Elson AreasLeslie K Kaylen Motl, PA-C 09/03/16 1404    Linwood DibblesJon Knapp, MD 09/05/16 2019

## 2016-09-03 ENCOUNTER — Encounter (HOSPITAL_COMMUNITY): Payer: Self-pay

## 2016-09-03 ENCOUNTER — Observation Stay (HOSPITAL_COMMUNITY)
Admission: EM | Admit: 2016-09-03 | Discharge: 2016-09-04 | Disposition: A | Discharge status: Home / Self Care | Payer: Medicaid Other | Attending: Internal Medicine | Admitting: Internal Medicine

## 2016-09-03 ENCOUNTER — Emergency Department (HOSPITAL_COMMUNITY): Payer: Medicaid Other

## 2016-09-03 DIAGNOSIS — E872 Acidosis, unspecified: Secondary | ICD-10-CM | POA: Diagnosis present

## 2016-09-03 DIAGNOSIS — E0500 Thyrotoxicosis with diffuse goiter without thyrotoxic crisis or storm: Secondary | ICD-10-CM | POA: Insufficient documentation

## 2016-09-03 DIAGNOSIS — J302 Other seasonal allergic rhinitis: Secondary | ICD-10-CM | POA: Diagnosis not present

## 2016-09-03 DIAGNOSIS — J189 Pneumonia, unspecified organism: Secondary | ICD-10-CM | POA: Diagnosis present

## 2016-09-03 DIAGNOSIS — B9789 Other viral agents as the cause of diseases classified elsewhere: Secondary | ICD-10-CM | POA: Diagnosis not present

## 2016-09-03 DIAGNOSIS — D72819 Decreased white blood cell count, unspecified: Secondary | ICD-10-CM | POA: Diagnosis present

## 2016-09-03 DIAGNOSIS — J4520 Mild intermittent asthma, uncomplicated: Secondary | ICD-10-CM

## 2016-09-03 DIAGNOSIS — J1000 Influenza due to other identified influenza virus with unspecified type of pneumonia: Principal | ICD-10-CM | POA: Insufficient documentation

## 2016-09-03 DIAGNOSIS — J45909 Unspecified asthma, uncomplicated: Secondary | ICD-10-CM | POA: Diagnosis present

## 2016-09-03 DIAGNOSIS — D649 Anemia, unspecified: Secondary | ICD-10-CM | POA: Diagnosis not present

## 2016-09-03 DIAGNOSIS — Z841 Family history of disorders of kidney and ureter: Secondary | ICD-10-CM

## 2016-09-03 DIAGNOSIS — N946 Dysmenorrhea, unspecified: Secondary | ICD-10-CM | POA: Insufficient documentation

## 2016-09-03 DIAGNOSIS — D61818 Other pancytopenia: Secondary | ICD-10-CM | POA: Insufficient documentation

## 2016-09-03 DIAGNOSIS — J4522 Mild intermittent asthma with status asthmaticus: Secondary | ICD-10-CM | POA: Insufficient documentation

## 2016-09-03 DIAGNOSIS — K5900 Constipation, unspecified: Secondary | ICD-10-CM | POA: Insufficient documentation

## 2016-09-03 DIAGNOSIS — J309 Allergic rhinitis, unspecified: Secondary | ICD-10-CM | POA: Diagnosis present

## 2016-09-03 DIAGNOSIS — Z6829 Body mass index (BMI) 29.0-29.9, adult: Secondary | ICD-10-CM | POA: Insufficient documentation

## 2016-09-03 DIAGNOSIS — E890 Postprocedural hypothyroidism: Secondary | ICD-10-CM

## 2016-09-03 DIAGNOSIS — Z836 Family history of other diseases of the respiratory system: Secondary | ICD-10-CM

## 2016-09-03 DIAGNOSIS — Z9109 Other allergy status, other than to drugs and biological substances: Secondary | ICD-10-CM | POA: Insufficient documentation

## 2016-09-03 DIAGNOSIS — Z79899 Other long term (current) drug therapy: Secondary | ICD-10-CM | POA: Insufficient documentation

## 2016-09-03 DIAGNOSIS — Z91048 Other nonmedicinal substance allergy status: Secondary | ICD-10-CM

## 2016-09-03 DIAGNOSIS — Z720 Tobacco use: Secondary | ICD-10-CM | POA: Diagnosis present

## 2016-09-03 DIAGNOSIS — F17210 Nicotine dependence, cigarettes, uncomplicated: Secondary | ICD-10-CM

## 2016-09-03 DIAGNOSIS — E669 Obesity, unspecified: Secondary | ICD-10-CM | POA: Insufficient documentation

## 2016-09-03 DIAGNOSIS — Z7951 Long term (current) use of inhaled steroids: Secondary | ICD-10-CM

## 2016-09-03 DIAGNOSIS — Z8639 Personal history of other endocrine, nutritional and metabolic disease: Secondary | ICD-10-CM

## 2016-09-03 DIAGNOSIS — R112 Nausea with vomiting, unspecified: Secondary | ICD-10-CM

## 2016-09-03 LAB — COMPREHENSIVE METABOLIC PANEL
ALBUMIN: 2.8 g/dL — AB (ref 3.5–5.0)
ALT: 23 U/L (ref 14–54)
AST: 57 U/L — AB (ref 15–41)
Alkaline Phosphatase: 45 U/L (ref 38–126)
Anion gap: 8 (ref 5–15)
CHLORIDE: 112 mmol/L — AB (ref 101–111)
CO2: 19 mmol/L — ABNORMAL LOW (ref 22–32)
CREATININE: 0.77 mg/dL (ref 0.44–1.00)
Calcium: 8 mg/dL — ABNORMAL LOW (ref 8.9–10.3)
GFR calc Af Amer: >60 mL/min (ref >60)
GFR calc non Af Amer: >60 mL/min (ref >60)
GLUCOSE: 76 mg/dL (ref 65–99)
POTASSIUM: 3.8 mmol/L (ref 3.5–5.1)
Sodium: 139 mmol/L (ref 135–145)
Total Bilirubin: 0.6 mg/dL (ref 0.3–1.2)
Total Protein: 5.7 g/dL — ABNORMAL LOW (ref 6.5–8.1)

## 2016-09-03 LAB — CBC WITH DIFFERENTIAL/PLATELET
BASOS ABS: 0 10*3/uL (ref 0.0–0.1)
BASOS PCT: 0 %
Eosinophils Absolute: 0 10*3/uL (ref 0.0–0.7)
Eosinophils Relative: 0 %
HEMATOCRIT: 35.5 % — AB (ref 36.0–46.0)
Hemoglobin: 11.9 g/dL — ABNORMAL LOW (ref 12.0–15.0)
LYMPHS PCT: 20 %
Lymphs Abs: 0.6 10*3/uL — ABNORMAL LOW (ref 0.7–4.0)
MCH: 29.2 pg (ref 26.0–34.0)
MCHC: 33.5 g/dL (ref 30.0–36.0)
MCV: 87 fL (ref 78.0–100.0)
MONO ABS: 0.1 10*3/uL (ref 0.1–1.0)
Monocytes Relative: 4 %
NEUTROS ABS: 2.5 10*3/uL (ref 1.7–7.7)
Neutrophils Relative %: 76 %
PLATELETS: 160 10*3/uL (ref 150–400)
RBC: 4.08 MIL/uL (ref 3.87–5.11)
RDW: 14.2 % (ref 11.5–15.5)
WBC: 3.2 10*3/uL — AB (ref 4.0–10.5)

## 2016-09-03 LAB — RAPID HIV SCREEN (HIV 1/2 AB+AG)
HIV 1/2 ANTIBODIES: NONREACTIVE
HIV-1 P24 Antigen - HIV24: NONREACTIVE

## 2016-09-03 LAB — PROCALCITONIN

## 2016-09-03 LAB — CK: CK TOTAL: 912 U/L — AB (ref 38–234)

## 2016-09-03 LAB — LIPASE, BLOOD: Lipase: 17 U/L (ref 11–51)

## 2016-09-03 LAB — I-STAT CG4 LACTIC ACID, ED: Lactic Acid, Venous: 0.39 mmol/L — ABNORMAL LOW (ref 0.5–1.9)

## 2016-09-03 MED ORDER — PROMETHAZINE HCL 25 MG/ML IJ SOLN
12.5000 mg | Freq: Once | INTRAMUSCULAR | Status: AC
  Administered 2016-09-03: 12.5 mg via INTRAVENOUS
  Filled 2016-09-03: qty 1

## 2016-09-03 MED ORDER — ONDANSETRON 4 MG PO TBDP
4.0000 mg | ORAL_TABLET | Freq: Once | ORAL | Status: AC
  Administered 2016-09-03: 4 mg via ORAL

## 2016-09-03 MED ORDER — ENOXAPARIN SODIUM 40 MG/0.4ML ~~LOC~~ SOLN
40.0000 mg | SUBCUTANEOUS | Status: DC
  Administered 2016-09-03: 40 mg via SUBCUTANEOUS
  Filled 2016-09-03: qty 0.4

## 2016-09-03 MED ORDER — AZITHROMYCIN 250 MG PO TABS: 250.0000 mg | ORAL_TABLET | Freq: Every day | ORAL | 0 refills | Status: DC

## 2016-09-03 MED ORDER — OSELTAMIVIR PHOSPHATE 75 MG PO CAPS
75.0000 mg | ORAL_CAPSULE | Freq: Two times a day (BID) | ORAL | Status: DC
  Administered 2016-09-03 – 2016-09-04 (×2): 75 mg via ORAL
  Filled 2016-09-03 (×3): qty 1

## 2016-09-03 MED ORDER — SODIUM CHLORIDE 0.9 % IV BOLUS (SEPSIS)
1000.0000 mL | Freq: Once | INTRAVENOUS | Status: AC
  Administered 2016-09-03: 1000 mL via INTRAVENOUS

## 2016-09-03 MED ORDER — FLUTICASONE PROPIONATE 50 MCG/ACT NA SUSP
1.0000 | Freq: Every day | NASAL | Status: DC
  Administered 2016-09-03: 2 via NASAL
  Filled 2016-09-03: qty 16

## 2016-09-03 MED ORDER — ONDANSETRON HCL 4 MG/2ML IJ SOLN: 4.0000 mg | Freq: Three times a day (TID) | INTRAMUSCULAR | Status: DC | PRN

## 2016-09-03 MED ORDER — OXYMETAZOLINE HCL 0.05 % NA SOLN
1.0000 | Freq: Two times a day (BID) | NASAL | Status: DC | PRN
  Administered 2016-09-03: 1 via NASAL
  Filled 2016-09-03: qty 15

## 2016-09-03 MED ORDER — OXYMETAZOLINE HCL 0.05 % NA SOLN: 1.0000 | Freq: Two times a day (BID) | NASAL | 0 refills | Status: DC

## 2016-09-03 MED ORDER — KETOROLAC TROMETHAMINE 30 MG/ML IJ SOLN
30.0000 mg | Freq: Four times a day (QID) | INTRAMUSCULAR | Status: DC | PRN
  Administered 2016-09-03 – 2016-09-04 (×3): 30 mg via INTRAVENOUS
  Filled 2016-09-03 (×3): qty 1

## 2016-09-03 MED ORDER — OSELTAMIVIR PHOSPHATE 75 MG PO CAPS
75.0000 mg | ORAL_CAPSULE | Freq: Once | ORAL | Status: AC
  Administered 2016-09-03: 75 mg via ORAL
  Filled 2016-09-03: qty 1

## 2016-09-03 MED ORDER — MOMETASONE FURO-FORMOTEROL FUM 200-5 MCG/ACT IN AERO
2.0000 | INHALATION_SPRAY | Freq: Two times a day (BID) | RESPIRATORY_TRACT | Status: DC
  Administered 2016-09-03 – 2016-09-04 (×2): 2 via RESPIRATORY_TRACT
  Filled 2016-09-03: qty 8.8

## 2016-09-03 MED ORDER — KETOROLAC TROMETHAMINE 60 MG/2ML IM SOLN
60.0000 mg | Freq: Once | INTRAMUSCULAR | Status: DC
  Filled 2016-09-03: qty 2

## 2016-09-03 MED ORDER — ONDANSETRON 4 MG PO TBDP
ORAL_TABLET | ORAL | Status: AC
  Filled 2016-09-03: qty 1

## 2016-09-03 MED ORDER — IPRATROPIUM-ALBUTEROL 0.5-2.5 (3) MG/3ML IN SOLN
3.0000 mL | Freq: Four times a day (QID) | RESPIRATORY_TRACT | Status: DC
  Administered 2016-09-04 (×2): 3 mL via RESPIRATORY_TRACT
  Filled 2016-09-03 (×2): qty 3

## 2016-09-03 MED ORDER — KETOROLAC TROMETHAMINE 30 MG/ML IJ SOLN
30.0000 mg | Freq: Once | INTRAMUSCULAR | Status: AC
  Administered 2016-09-03: 30 mg via INTRAVENOUS
  Filled 2016-09-03: qty 1

## 2016-09-03 MED ORDER — KCL IN DEXTROSE-NACL 20-5-0.9 MEQ/L-%-% IV SOLN
INTRAVENOUS | Status: AC
  Administered 2016-09-03 (×2): via INTRAVENOUS
  Filled 2016-09-03 (×3): qty 1000

## 2016-09-03 MED ORDER — ONDANSETRON 4 MG PO TBDP: 4.0000 mg | ORAL_TABLET | Freq: Three times a day (TID) | ORAL | 0 refills | Status: DC | PRN

## 2016-09-03 MED ORDER — ONDANSETRON HCL 4 MG/2ML IJ SOLN
4.0000 mg | Freq: Once | INTRAMUSCULAR | Status: AC
  Administered 2016-09-03: 4 mg via INTRAVENOUS
  Filled 2016-09-03: qty 2

## 2016-09-03 MED ORDER — ONDANSETRON HCL 4 MG PO TABS
4.0000 mg | ORAL_TABLET | Freq: Four times a day (QID) | ORAL | Status: DC | PRN
Start: 2016-09-03 — End: 2016-09-04
  Administered 2016-09-04: 4 mg via ORAL
  Filled 2016-09-03: qty 1

## 2016-09-03 MED ORDER — IOPAMIDOL (ISOVUE-370) INJECTION 76%
INTRAVENOUS | Status: AC
  Administered 2016-09-03: 100 mL
  Filled 2016-09-03: qty 100

## 2016-09-03 MED ORDER — MORPHINE SULFATE (PF) 4 MG/ML IV SOLN
2.0000 mg | Freq: Once | INTRAVENOUS | Status: AC
  Administered 2016-09-03: 2 mg via INTRAVENOUS
  Filled 2016-09-03: qty 1

## 2016-09-03 MED ORDER — ONDANSETRON 4 MG PO TBDP
ORAL_TABLET | ORAL | Status: AC
  Administered 2016-09-03: 4 mg
  Filled 2016-09-03: qty 1

## 2016-09-03 MED ORDER — BENZONATATE 100 MG PO CAPS: 200.0000 mg | ORAL_CAPSULE | Freq: Two times a day (BID) | ORAL | 0 refills | Status: DC | PRN

## 2016-09-03 MED ORDER — ONDANSETRON HCL 4 MG/2ML IJ SOLN
4.0000 mg | Freq: Four times a day (QID) | INTRAMUSCULAR | Status: DC | PRN
  Administered 2016-09-03 – 2016-09-04 (×2): 4 mg via INTRAVENOUS
  Filled 2016-09-03 (×2): qty 2

## 2016-09-03 NOTE — ED Provider Notes (Signed)
Hand-off from NCR CorporationKaren Sofia, [place] JerseyPA-C. Pending CXR.  See initial provider's note for full HPI.  Briefly patient is a 31 year old female who presents to the ED with complaints of nausea, cough, shortness of breath, wheezing, nasal congestion, chills and body aches. She reports her boyfriend has had similar symptoms this week. Reports taking over-the-counter medication without relief.  Physical Exam  BP 107/78   Pulse 67   Temp 99 F (37.2 C) (Oral) Comment: tylenol 30min prior  Resp 18   LMP 08/31/2016   SpO2 100%   Physical Exam  Constitutional: She is oriented to person, place, and time. She appears well-developed and well-nourished. No distress.  HENT:  Head: Normocephalic and atraumatic.  Mouth/Throat: Uvula is midline, oropharynx is clear and moist and mucous membranes are normal. No oropharyngeal exudate, posterior oropharyngeal edema, posterior oropharyngeal erythema or tonsillar abscesses. No tonsillar exudate.  Eyes: Conjunctivae and EOM are normal. Right eye exhibits no discharge. Left eye exhibits no discharge. No scleral icterus.  Neck: Normal range of motion. Neck supple.  Cardiovascular: Normal rate, regular rhythm, normal heart sounds and intact distal pulses.   Pulmonary/Chest: Effort normal and breath sounds normal. No respiratory distress. She has no wheezes. She has no rales. She exhibits no tenderness.  Abdominal: Soft. Bowel sounds are normal. She exhibits no distension and no mass. There is no tenderness. There is no rebound and no guarding. No hernia.  Musculoskeletal: Normal range of motion. She exhibits no edema.  Lymphadenopathy:    She has no cervical adenopathy.  Neurological: She is alert and oriented to person, place, and time.  Skin: Skin is warm and dry. She is not diaphoretic.  Nursing note and vitals reviewed.   ED Course  Procedures  MDM Patient presents with URI symptoms with associated nausea, chills and body aches. Patient checked into the ED  yesterday and had a chest x-ray performed however she left prior to being seen. Chart review shows possible pneumonia on chest x-ray performed yesterday. Repeat chest x-ray ordered today. Exam performed by initial provider unremarkable. Patient given IV fluids, Zofran and IV antibiotics for suspected CAP. CXR pending.   On my initial evaluation, patient is resting in bed and reports  mild return of nausea with chills and body aches. Patient given second liter of IV fluids, Tylenol and Zofran. Chest x-ray showed pneumonia and right upper lobe. Plan to finish IV antibiotics in the ED for treatment of CAP and discharged home with prescription of azithromycin. Discussed results and plan for discharge with patient. Patient also given prescriptions for symptomatically treatment including antitussive, antiemetic and decongestant advise important to continue oral hydration at home. Advised patient to follow up with PCP for follow-up evaluation this week. Discussed return precautions.       Satira Sarkicole Elizabeth PlainvilleNadeau, [place] JerseyPA-C 09/03/16 0017    Lavera Guiseana Duo Liu, MD 09/04/16 [phone]

## 2016-09-03 NOTE — ED Notes (Signed)
Patient is stable and ready to be transport to the floor at this time.  Report was called to 6N RN.  Belongings taken with the patient to the floor.   

## 2016-09-03 NOTE — ED Notes (Addendum)
Wheeled pt to bathroom  And back to room. Pt stated that she felt okay when standing and sitting on toilet

## 2016-09-03 NOTE — H&P (Signed)
Date: 09/03/2016               Patient Name:  Gwendolyn Clay MRN: [id number]  DOB: [birth year] Age / Sex: 31 y.o., female   PCP: Renaye Rakers, MD         Medical Service: Internal Medicine Teaching Service         Attending Physician: Dr. Gust Rung, DO    First Contact: Dr. Peggyann Juba Pager: [phone]  Second Contact: Dr. Earlene Plater Pager: [phone]       After Hours (After 5p/  First Contact Pager: [phone]  weekends / holidays): Second Contact Pager: [phone]   Chief Complaint: pneumonia  History of Present Illness: Gwendolyn Clay is a 33 year old mother of seven children with mild intermittent asthma, seasonal allergic rhinitis, history of Graves' hypothyroidism who presented to the emergency department for the third time in 48 hours for pneumonia.  Lastly, multiple individuals in her home or sick, and she began noticing symptoms around 3-4 days which began with generalized aches and joint pain along with heaviness in her chest which initially improved with albuterol treatments but then persisted. Thereafter, she developed orthopnea, dry cough, subjective weakness, dizziness, poor appetite. She went to the emergency department 2 days ago at which time a chest x-ray was ordered though she left because the wait time was 5 hours. She presented again to the emergency department yesterday and was treated with ceftriaxone and azithromycin for pneumonia. Though she was discharged home, she became nauseous while walking to the cab which then progressed to vomiting. She then returned to the emergency department for further evaluation.  She had a similar episode of this in the past 2014 at which time she was diagnosed with pneumonia in the setting of asthma. Normally, she uses albuterol 1-2 times a week and uses Symbicort intermittently to avoid becoming dependent on this medication. In the spring, she requires Singulair and Flonase for seasonal allergies which she follows with an outpatient  allergist. She did not receive the flu shot this year and denies a prior history of intubation. She smokes one pack of cigarettes every 3-4 days and has done so over the last 10-15 years though quit for some time before restarting in 2013.  In the emergency department, she was given Toradol, morphine, Zofran. Initial lab work was notable for sodium 139, chloride 112, low bicarbonate 19, anion gap 8, BUN undetectable, creatinine 0.77, low albumin 2.8, leukopenia 3.2 with low absolute lymphocyte count 0.6, normocytic anemia with Hb 11.9, MCV 87.0. Urinalysis was notable for small hemoglobin, protein 100 mg/dL, ketones 80 mg/dL. CT angiogram was unremarkable for pulmonary embolus though showed extensive bilateral airspace opacities most consistent with a multifocal pneumonia. As she became hypoxic with ambulation, she was admitted for further evaluation.  Meds:  No outpatient prescriptions have been marked as taking for the 09/03/16 encounter [facility] Encounter).  Albuterol inhaler Symbicort 2 puffs/day as needed Singulair 10 mg at bedtime in the spring Flonase 1-2 sprays twice daily Synthroid 175 mcg before breakfast, last used a few months ago Afrin spray daily  Allergies: Allergies as of 09/03/2016 - Review Complete 09/03/2016  Allergen Reaction Noted  . Mold extract [trichophyton] Other (See Comments) 09/25/2013  . Other  09/25/2013   Past Medical History:  Diagnosis Date  . Allergic rhinitis    adenoidectomy  . Asthma   . Bacterial infection   . Dysmenorrhea   . Eczema   . Gonorrhea   . Graves disease   .  Graves' disease   . History of chicken pox   . Hypothyroidism   . Hypothyroidism   . Obese   . Trichomonas     Family History: Cousin who required a renal transplant in his 4120s. Grandfather who died of a "lack mass" in his lungs (lung cancer?]. No other heart, lung, kidney, liver disease in her family members.  Social History: Stays at home and rears 7 children of which 2  are her own biologically and the other 5 are from her boyfriends prior relationships. She denies ongoing alcohol or illicit drug use.  Review of Systems: No headache, diarrhea, rash. A complete ROS was negative except as per HPI.   Physical Exam: Blood pressure 119/84, pulse 74, temperature 98.8 F (37.1 C), resp. rate 22, last menstrual period 08/31/2016, SpO2 95 %, not currently breastfeeding. Physical Exam  Constitutional: She is oriented to person, place, and time. No distress.  Tired appearing  HENT:  Head: Normocephalic and atraumatic.  Dry mucous membranes  Eyes: Conjunctivae are normal. Pupils are equal, round, and reactive to light. No scleral icterus.  Neck: No JVD present.  Cardiovascular: Normal rate, regular rhythm and intact distal pulses.  Exam reveals no gallop and no friction rub.   No murmur heard. Pulmonary/Chest: Effort normal and breath sounds normal. No respiratory distress. She has no wheezes.  Abdominal: Soft. Bowel sounds are normal. She exhibits no distension and no mass. There is tenderness (Mild tenderness to palpation in all 4 quadrants). There is no rebound and no guarding.  Musculoskeletal: She exhibits no edema.  Lymphadenopathy:    She has no cervical adenopathy.  Neurological: She is alert and oriented to person, place, and time.  Skin: Skin is warm and dry. She is not diaphoretic.   CXR: I reviewed and compared with 01/05/16. Increased interstitial markings bilaterally with a developing infiltrate in the right upper lobe suggestive of pneumonia..  Assessment & Plan by Problem: Principal Problem:   Multifocal pneumonia Active Problems:   Allergic rhinitis   Asthma   History of Graves' disease   Metabolic acidosis, normal anion gap (NAG)   Leukopenia   Tobacco abuse   Normocytic anemia  Gwendolyn Clay is a 89 year old mother of seven children with mild intermittent asthma, allergic rhinitis, ongoing tobacco abuse, history of Graves' hypothyroidism  hospitalized for pneumonia found to have normocytic anemia, leukopenia, non-gap anion gap metabolic acidosis.  Pneumonia: Suspect may be related to acute viral illness, quite possibly influenza. She also has low albumin with 2 urinalysis that showed some degree of proteinuria though her renal function appears at baseline to suggest against nephrotic syndrome. Protein wasting could lead to deficient levels of immunoglobulins and put her at risk for infections with encapsulated organisms, like pneumococcus. -Check HIV -Check pro calcitonin to rule out bacterial pneumonia but start azithromycin and ceftriaxone otherwise -Follow-up blood cultures -Check CK to r/o viral rhabdo given urinalysis findings -Continue Tamiflu twice daily to complete a 5 day course -Give D5 normal saline with 20 mEq of potassium at 100 mL/hour 24 given poor oral intake line -Give ondansetron 5 mg IV or by mouth as needed every 6 hours for nausea -Continue ketorolac 30 mg IV every 6 hours as needed for pain  Normocytic anemia and leukopenia: Viral illnesses are known to induce bone marrow suppression, and she does have a decrease in her white count from baseline 4-5 as well as Hb from baseline 12-13. Her platelets are slightly above the upper limit of normal.  -Recheck  CBC with differential tomorrow to ensure correction of normocytic anemia and leukopenia  Non-anion gap metabolic acidosis: Bicarbonate low on admission. She does not have any diarrheal losses, so I suspect it is likely from contraction alkalosis.  -Recheck BMET tomorrow to ensure correction with IV fluids as noted above  Mild intermittent asthma: No wheezing noted on my exam. She is not hypoxic at rest which is reassuring. -Continue DuoNeb every 6 hours -Recommend tobacco cessation as it worsens underlying disease  Seasonal allergic rhinitis: Continue home fluticasone nasal spray 1-2 sprays/nostil daily  -Continue oxymetazoline nasal spray 1 spray in each  nostril twice daily as needed. Recommended to her to reduce use to avoid rebound congestion  History of Graves' hypothyroidism: Rechecking a TSH does not seem high-yield in the setting of an acute illness. She acknowledges nonadherence to her supplemental thyroid hormone. -Recheck TSH as outpatient  #FEN:  -Diet: Clears  #DVT prophylaxis: Lovenox  #CODE STATUS: FULL CODE -Defer to mother Iverson Alamin [[id number]] if patients lacks decision-making capacity -Confirmed with patient on admission  Dispo: Admit patient to Observation with expected length of stay less than 2 midnights.  Signed: Beather Arbour, MD 09/03/2016, 11:28 AM  Pager: [phone]

## 2016-09-03 NOTE — ED Notes (Signed)
PT boyfriend siting in room

## 2016-09-03 NOTE — ED Notes (Signed)
Pt c/o chest heaviness, n/v, abd pain onset 2 days ago. Pt was seen initially seen here but left AMA after xray and labs while waiting. [facility] Pt states soon after leaving she n/v returned with shob.

## 2016-09-03 NOTE — ED Triage Notes (Signed)
Per EMS: [facility]. Pt dx with pneumonia, given rx for cough, abx and nausea medicine. Pt states unable to fill rx at this time. Pt returning for abdominal pain and nausea. Pt states "I don't have anything else to throw up."

## 2016-09-03 NOTE — Discharge Instructions (Signed)
Take your medication as prescribed. Take your antibiotic until completed. Continue drinking fluids at home to remain hydrated. I recommended eating a bland diet for the next few days and see her symptoms have improved. You may also continue taking Tylenol and ibuprofen as prescribed over-the-counter, or drinking between doses every 3-4 hours. I recommend fine up with your primary care provider in the next 4-5 days if your symptoms have not improved. Please return to the Emergency Department if symptoms worsen or new onset of chest pain, difficulty breathing, vomiting, unable to keep fluids down, abdominal pain.

## 2016-09-03 NOTE — ED Provider Notes (Signed)
[facility]-EMERGENCY DEPT Provider Note   CSN: [id number] Arrival date & time: 09/03/16  0203     History   Chief Complaint Chief Complaint  Patient presents with  . Abdominal Pain  . Pneumonia    HPI Gwendolyn Clay is a 31 y.o. female.  Patient with h/o asthma, no past surgical history, recent diagnosis of PNA, presents with c/o generalized abdominal pain and vomiting. Patient has had cough, joint pains, body aches, fever/chills x 2-3 days. She had intermittent abd pain during this time. She was seen at [facility] two days ago, LWBS, returned yesterday and was dx with PNA on repeat CXR. Was given azithromycin and Rocephin (9pm on 09/02/16) and discharged in improved condition. When she returned home, she had onset of more severe generalized abdominal cramping and vomiting that was persistent. This prompted call to EMS and return to the hospital. Other symptoms continue. She has noted blood in vomit, unsure if blood in sputum. No diarrhea but endorses constipation. She also c/o decreased urinary output. States her boyfriend has similar sx. Patient denies risk factors for pulmonary embolism including: unilateral leg swelling, history of DVT/PE/other blood clots, recent immobilizations, recent surgery, recent travel (>4hr segment), malignancy. She does have an implanon.        Past Medical History:  Diagnosis Date  . Allergic rhinitis    adenoidectomy  . Asthma   . Bacterial infection   . Dysmenorrhea   . Eczema   . Gonorrhea   . Graves disease   . Graves' disease   . History of chicken pox   . Hypothyroidism   . Hypothyroidism   . Obese   . Trichomonas     Patient Active Problem List   Diagnosis Date Noted  . Finger joint stiff 01/09/2016  . Displaced fracture of base of fifth metacarpal bone of right hand with routine healing 11/21/2015  . Hand discomfort 11/21/2015  . NSVD (normal spontaneous vaginal delivery) 09/08/2014  . Normal labor and delivery 09/07/2014  .  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 Gestation period, 33 weeks 07/28/2014  . Currently pregnant 07/28/2014  . Nausea with vomiting 02/13/2014  . Back pain 08/02/2012  . Status asthmaticus 08/02/2012  . Allergic rhinitis   . Eczema   . Asthma   . Hypothyroidism   . Obese     Past Surgical History:  Procedure Laterality Date  . ADENOIDECTOMY    . CLOSED REDUCTION METACARPAL WITH PERCUTANEOUS PINNING Right 10/13/2015   Procedure: CLOSED REDUCTION PERCUTANEOUS PINNING RIGHT SMALL METACARPAL FRACTURE ;  Surgeon: Betha Loa, MD;  Location: [facility];  Service: Orthopedics;  Laterality: Right;  . WISDOM TOOTH EXTRACTION      OB History    Gravida Para Term Preterm AB Living   2 2 2     2    SAB TAB Ectopic Multiple Live Births         0 2       Home Medications    Prior to Admission medications   Medication Sig Start Date End Date Taking? Authorizing Provider  albuterol (PROAIR HFA) 108 (90 Base) MCG/ACT inhaler Inhale 2 puffs into the lungs every 4 (four) hours as needed for wheezing or shortness of breath. 02/15/16   Roselyn Kara Mead, MD  albuterol (PROVENTIL) (2.5 MG/3ML) 0.083% nebulizer solution Take 3 mLs (2.5 mg total) by nebulization every 4 (four) hours as needed for wheezing or shortness of breath. 02/15/16   Roselyn Kara Mead, MD  azithromycin (ZITHROMAX) 250 MG tablet Take  1 tablet (250 mg total) by mouth daily. Take first 2 tablets together, then 1 every day until finished. 09/03/16   Barrett HenleNicole Elizabeth Nadeau, PA-C  benzonatate (TESSALON) 100 MG capsule Take 2 capsules (200 mg total) by mouth 2 (two) times daily as needed for cough. 09/03/16   Barrett HenleNicole Elizabeth Nadeau, PA-C  budesonide-formoterol [facility](SYMBICORT) 160-4.5 MCG/ACT inhaler 2 puffs twice a day Rinse Gargle and Spit after use Patient taking differently: daily as needed. 2 puffs twice a day Rinse Gargle and Spit after use 02/15/16   Baxter Hireoselyn M Hicks, MD  etonogestrel (NEXPLANON) 68 MG IMPL implant 1 each by Subdermal  route once.    Historical Provider, MD  fluticasone (FLONASE) 50 MCG/ACT nasal spray Use 1-2 sprays in the morning Patient not taking: Reported on 09/02/2016 02/15/16   Baxter Hireoselyn M Hicks, MD  levocetirizine (XYZAL) 5 MG tablet Take 1 tablet (5 mg total) by mouth every evening. Patient not taking: Reported on 08/08/2016 02/15/16   Baxter Hireoselyn M Hicks, MD  levothyroxine (SYNTHROID, LEVOTHROID) 175 MCG tablet TAKE 1 TABLET BY MOUTH DAILY BEFORE BREAKFAST. Patient not taking: Reported on 09/02/2016 02/06/16   Brock Badharles A Harper, MD  montelukast (SINGULAIR) 10 MG tablet Take 1 tablet (10 mg total) by mouth at bedtime. Patient not taking: Reported on 09/02/2016 02/15/16   Baxter Hireoselyn M Hicks, MD  ondansetron (ZOFRAN ODT) 4 MG disintegrating tablet Take 1 tablet (4 mg total) by mouth every 8 (eight) hours as needed for nausea or vomiting. 09/03/16   Barrett HenleNicole Elizabeth Nadeau, PA-C  oxymetazoline (AFRIN NASAL SPRAY) 0.05 % nasal spray Place 1 spray into both nostrils 2 (two) times daily. Spray once into each nostril twice daily for up to the next 3 days. Do not use for more than 3 days to prevent rebound rhinorrhea. 09/03/16   Barrett HenleNicole Elizabeth Nadeau, PA-C  valACYclovir (VALTREX) 1000 MG tablet Take 1 tablet po bid x 3 days prn. Patient not taking: Reported on 09/02/2016 08/08/16   Brock Badharles A Harper, MD    Family History Family History  Problem Relation Age of Onset  . Arthritis Maternal Aunt   . Diabetes Maternal Uncle   . Hypertension Maternal Grandmother   . Cancer Maternal Grandfather     PROSTATE  . Asthma      neices and nephews  . Eczema      neices and nephews  . Allergic rhinitis Brother   . Eczema Daughter   . Diabetes Father     Social History Social History  Substance Use Topics  . Smoking status: Current Every Day Smoker    Packs/day: 0.20    Types: Cigarettes    Last attempt to quit: 03/30/2014  . Smokeless tobacco: Never Used     Comment: smoke 5-6 cigarettes daily  . Alcohol use Yes     Comment:  occasional     Allergies   Mold extract [trichophyton] and Other   Review of Systems Review of Systems  Constitutional: Positive for chills and fever.  HENT: Negative for congestion, rhinorrhea and sore throat.   Eyes: Negative for redness.  Respiratory: Positive for cough.   Cardiovascular: Negative for chest pain.  Gastrointestinal: Positive for abdominal pain, constipation, nausea and vomiting. Negative for diarrhea.  Genitourinary: Negative for dysuria.  Musculoskeletal: Positive for arthralgias and myalgias.  Skin: Negative for rash.  Neurological: Negative for headaches.     Physical Exam Updated Vital Signs BP 120/76   Pulse 71   Temp 98.7 F (37.1 C) (Oral)   Resp 20  LMP 08/31/2016   SpO2 99%   Physical Exam  Constitutional: She appears well-developed and well-nourished.  HENT:  Head: Normocephalic and atraumatic.  Mouth/Throat: Oropharynx is clear and moist.  Eyes: Conjunctivae are normal. Right eye exhibits no discharge. Left eye exhibits no discharge.  Neck: Normal range of motion. Neck supple.  Cardiovascular: Normal rate, regular rhythm and normal heart sounds.  Exam reveals no gallop and no friction rub.   No murmur heard. Pulmonary/Chest: Effort normal. Tachypnea noted. She has no wheezes. She has rales (bilateral bases).  Abdominal: Soft. She exhibits no mass. There is tenderness (generalized, worse upper). There is no rebound.  Musculoskeletal: She exhibits no edema or tenderness.  Neurological: She is alert.  Skin: Skin is warm and dry.  Psychiatric: She has a normal mood and affect.  Nursing note and vitals reviewed.    ED Treatments / Results  Labs (all labs ordered are listed, but only abnormal results are displayed) Labs Reviewed  CBC WITH DIFFERENTIAL/PLATELET - Abnormal; Notable for the following:       Result Value   WBC 3.2 (*)    Hemoglobin 11.9 (*)    HCT 35.5 (*)    Lymphs Abs 0.6 (*)    All other components within normal  limits  COMPREHENSIVE METABOLIC PANEL - Abnormal; Notable for the following:    Chloride 112 (*)    CO2 19 (*)    BUN <5 (*)    Calcium 8.0 (*)    Total Protein 5.7 (*)    Albumin 2.8 (*)    AST 57 (*)    All other components within normal limits  CULTURE, BLOOD (ROUTINE X 2)  CULTURE, BLOOD (ROUTINE X 2)  LIPASE, BLOOD  INFLUENZA PANEL BY PCR (TYPE A & B)  RAPID HIV SCREEN (HIV 1/2 AB+AG)  I-STAT CG4 LACTIC ACID, ED    Radiology Dg Chest 2 View  Result Date: 09/02/2016 CLINICAL DATA:  Cough, congestion, fever and dyspnea for 2 days. EXAM: CHEST  2 VIEW COMPARISON:  09/01/2016 FINDINGS: New pneumonic consolidation in the right upper lobe. Faint airspace opacities noted about both hila consistent with possible alveolitis or pneumonitis. Heart and mediastinal contours are within normal limits. No acute osseous abnormality. No effusion or pneumothorax. IMPRESSION: Developing pneumonia in the right upper lobe in the setting of bilateral faint airspace opacities about both hila. Electronically Signed   By: Tollie Eth M.D.   On: 09/02/2016 21:19   Dg Chest 2 View  Result Date: 09/01/2016 CLINICAL DATA:  31 y/o F; persisting cough, wheezing, and congestion. EXAM: CHEST  2 VIEW COMPARISON:  01/05/2016 chest radiograph FINDINGS: Stable normal cardiac silhouette. Mild bronchitic markings. On the lateral view there are rounded opacities projecting over superior segments of lower lobes. No acute osseous abnormality. No pleural effusion or pneumothorax. IMPRESSION: Mild bronchitic markings. Rounded opacities project over superior segments of lower lobes and may represent areas of atelectasis or developing pneumonia. Follow-up radiographs after appropriate therapy is recommended to ensure resolution. Electronically Signed   By: Mitzi Hansen M.D.   On: 09/01/2016 22:49    Procedures Procedures (including critical care time)  Medications Ordered in ED Medications  ondansetron  (ZOFRAN-ODT) 4 MG disintegrating tablet (not administered)  oseltamivir (TAMIFLU) capsule 75 mg (not administered)  ondansetron (ZOFRAN-ODT) disintegrating tablet 4 mg (4 mg Oral Given 09/03/16 0217)  sodium chloride 0.9 % bolus 1,000 mL (1,000 mLs Intravenous New Bag/Given 09/03/16 0700)  ondansetron (ZOFRAN) injection 4 mg (4 mg Intravenous Given 09/03/16  0700)  morphine 4 MG/ML injection 2 mg (2 mg Intravenous Given 09/03/16 1610)  ketorolac (TORADOL) 30 MG/ML injection 30 mg (30 mg Intravenous Given 09/03/16 0706)  iopamidol (ISOVUE-370) 76 % injection (100 mLs  Contrast Given 09/03/16 0918)     Initial Impression / Assessment and Plan / ED Course  I have reviewed the triage vital signs and the nursing notes.  Pertinent labs & imaging results that were available during my care of the patient were reviewed by me and considered in my medical decision making (see chart for details).     Patient seen and examined. Reviewed previous labs/records. Several episodes of PNA in past. CT angio in 2013 without PE but showed PNA. Most recent neg HIV 12/2015. Vitals are WNL. Pt appears well. Given new symptoms, will check CMP and lipase. Will treat symptoms. Work-up initiated. Medications ordered.   Vital signs reviewed and are as follows: BP 120/76   Pulse 71   Temp 98.7 F (37.1 C) (Oral)   Resp 20   LMP 08/31/2016   SpO2 99%   7:03 AM Pt rechecked and is stable.   7:55 AM Labs reviewed. Will PO challenge and ambulate to ensure no desat.   8:20 AM Pt looks a lot better. Abd is soft, NT. I have given her ginger ale. Awaiting ambulation trial.   8:40 AM Desat to 90% with ambulation with elevated HR. Given multifocal PNA, will perform chest CT to better characterize and r/o PE with concurrent CP.    9:58 AM Given multifocal PNA, desat with ambulation, tachypnea, multiple visits -- would feel more comfortable with admit, pt agrees.   [facility] lists PCP as Dr. Parke Simmers. Pt states she is no longer  established there and has been using [facility] [facility] [place] as PCP. Will contact unassigned medicine for admit.   10:12 AM Spoke with Dr. Allena Katz, IMTS, who will see and admit.   Final Clinical Impressions(s) / ED Diagnoses   Final diagnoses:  Multifocal pneumonia  Non-intractable vomiting with nausea, unspecified vomiting type   Admit.   New Prescriptions Current Discharge Medication List       Renne Crigler, PA-C 09/03/16 1018    Shon Baton, MD 09/03/16 2318

## 2016-09-04 ENCOUNTER — Telehealth: Payer: Self-pay

## 2016-09-04 ENCOUNTER — Encounter (HOSPITAL_COMMUNITY): Payer: Self-pay | Admitting: General Practice

## 2016-09-04 DIAGNOSIS — B9789 Other viral agents as the cause of diseases classified elsewhere: Secondary | ICD-10-CM | POA: Diagnosis not present

## 2016-09-04 DIAGNOSIS — J1000 Influenza due to other identified influenza virus with unspecified type of pneumonia: Secondary | ICD-10-CM | POA: Diagnosis not present

## 2016-09-04 DIAGNOSIS — J302 Other seasonal allergic rhinitis: Secondary | ICD-10-CM | POA: Diagnosis not present

## 2016-09-04 DIAGNOSIS — J4520 Mild intermittent asthma, uncomplicated: Secondary | ICD-10-CM | POA: Diagnosis not present

## 2016-09-04 LAB — INFLUENZA PANEL BY PCR (TYPE A & B)
INFLAPCR: POSITIVE — AB
INFLBPCR: NEGATIVE

## 2016-09-04 LAB — BASIC METABOLIC PANEL
ANION GAP: 4 — AB (ref 5–15)
CO2: 21 mmol/L — ABNORMAL LOW (ref 22–32)
Calcium: 7.9 mg/dL — ABNORMAL LOW (ref 8.9–10.3)
Chloride: 114 mmol/L — ABNORMAL HIGH (ref 101–111)
Creatinine, Ser: 0.77 mg/dL (ref 0.44–1.00)
GFR calc Af Amer: >60 mL/min (ref >60)
Glucose, Bld: 87 mg/dL (ref 65–99)
POTASSIUM: 3.8 mmol/L (ref 3.5–5.1)
SODIUM: 139 mmol/L (ref 135–145)

## 2016-09-04 LAB — FERRITIN: FERRITIN: 229 ng/mL (ref 11–307)

## 2016-09-04 MED ORDER — PROMETHAZINE HCL 25 MG PO TABS
25.0000 mg | ORAL_TABLET | Freq: Four times a day (QID) | ORAL | Status: DC | PRN
  Administered 2016-09-04: 25 mg via ORAL
  Filled 2016-09-04: qty 1

## 2016-09-04 MED ORDER — PROMETHAZINE HCL 25 MG PO TABS: 25.0000 mg | ORAL_TABLET | Freq: Four times a day (QID) | ORAL | 0 refills | Status: DC | PRN

## 2016-09-04 MED ORDER — OSELTAMIVIR PHOSPHATE 75 MG PO CAPS: 75.0000 mg | ORAL_CAPSULE | Freq: Two times a day (BID) | ORAL | 0 refills | Status: DC

## 2016-09-04 MED ORDER — ACETAMINOPHEN 325 MG PO TABS: 650.0000 mg | ORAL_TABLET | Freq: Four times a day (QID) | ORAL | Status: DC | PRN

## 2016-09-04 MED ORDER — ONDANSETRON 4 MG PO TBDP: 4.0000 mg | ORAL_TABLET | Freq: Three times a day (TID) | ORAL | 0 refills | Status: DC | PRN

## 2016-09-04 MED ORDER — IBUPROFEN 600 MG PO TABS: 600.0000 mg | ORAL_TABLET | Freq: Four times a day (QID) | ORAL | 0 refills | Status: DC | PRN

## 2016-09-04 MED ORDER — IBUPROFEN 600 MG PO TABS
600.0000 mg | ORAL_TABLET | Freq: Four times a day (QID) | ORAL | Status: DC | PRN
  Administered 2016-09-04: 600 mg via ORAL
  Filled 2016-09-04: qty 1

## 2016-09-04 NOTE — Discharge Summary (Signed)
Name: Gwendolyn Clay MRN: [id number] DOB: 1985/09/07 31 y.o. PCP: Renaye RakersVeita Bland, MD  Date of Admission: 09/03/2016  5:24 AM Date of Discharge: 09/04/2016 Attending Physician: Gust RungErik C Hoffman, DO  Discharge Diagnosis:  Principal Problem:   Multifocal pneumonia Active Problems:   Allergic rhinitis   Asthma   History of Graves' disease   Metabolic acidosis, normal anion gap (NAG)   Leukopenia   Tobacco abuse   Normocytic anemia  Discharge Medications: Allergies as of 09/04/2016      Reactions   Mold Extract [trichophyton] Other (See Comments)   Wheezing   Other    Roaches cause wheezing      Medication List    STOP taking these medications   azithromycin 250 MG tablet Commonly known as:  ZITHROMAX   fluticasone 50 MCG/ACT nasal spray Commonly known as:  FLONASE   levocetirizine 5 MG tablet Commonly known as:  XYZAL   levothyroxine 175 MCG tablet Commonly known as:  SYNTHROID, LEVOTHROID   montelukast 10 MG tablet Commonly known as:  SINGULAIR   valACYclovir 1000 MG tablet Commonly known as:  VALTREX     TAKE these medications   acetaminophen 325 MG tablet Commonly known as:  TYLENOL Take 2 tablets (650 mg total) by mouth every 6 (six) hours as needed for mild pain, moderate pain, fever or headache.   albuterol 108 (90 Base) MCG/ACT inhaler Commonly known as:  PROAIR HFA Inhale 2 puffs into the lungs every 4 (four) hours as needed for wheezing or shortness of breath.   albuterol (2.5 MG/3ML) 0.083% nebulizer solution Commonly known as:  PROVENTIL Take 3 mLs (2.5 mg total) by nebulization every 4 (four) hours as needed for wheezing or shortness of breath.   benzonatate 100 MG capsule Commonly known as:  TESSALON Take 2 capsules (200 mg total) by mouth 2 (two) times daily as needed for cough.   budesonide-formoterol 160-4.5 MCG/ACT inhaler Commonly known as:  SYMBICORT 2 puffs twice a day Rinse Gargle and Spit after use What changed:  when to take  this  reasons to take this  additional instructions   ibuprofen 600 MG tablet Commonly known as:  ADVIL,MOTRIN Take 1 tablet (600 mg total) by mouth every 6 (six) hours as needed for fever, headache or moderate pain.   NEXPLANON 68 MG Impl implant Generic drug:  etonogestrel 1 each by Subdermal route once.   ondansetron 4 MG disintegrating tablet Commonly known as:  ZOFRAN ODT Take 1 tablet (4 mg total) by mouth every 8 (eight) hours as needed for nausea or vomiting.   oseltamivir 75 MG capsule Commonly known as:  TAMIFLU Take 1 capsule (75 mg total) by mouth 2 (two) times daily.   oxymetazoline 0.05 % nasal spray Commonly known as:  AFRIN NASAL SPRAY Place 1 spray into both nostrils 2 (two) times daily. Spray once into each nostril twice daily for up to the next 3 days. Do not use for more than 3 days to prevent rebound rhinorrhea.   promethazine 25 MG tablet Commonly known as:  PHENERGAN Take 1 tablet (25 mg total) by mouth every 6 (six) hours as needed for nausea or vomiting.       Disposition and follow-up:   Gwendolyn Clay was [facility] in Stable condition.  At the hospital follow up visit please address:  1.  Influenza.  Ensure symptomatic improvement and absence of secondary bacterial pneumonia.  2.  Pancytopenia?  Mild normocytic anemia, mild leukopenia,  and low-normal platelets.  Repeat CBC after acute illness is resolved and consider further workup.  HIV negative.  3.  Asthma.  She had no wheezing during admission, and reports following with an allergist.  Ensure no obstruction as her illness could make exacerbation more likely.  4.  Labs / imaging needed at time of follow-up: CBC  5.  Pending labs/ test needing follow-up: none  Follow-up Appointments: Follow-up Information    [facility]. Go in 9 day(s).   Why:  at 10:15 Contact information: [address] [place]  [address] [phone]          Hospital Course by problem list: Principal Problem:   Multifocal pneumonia Active Problems:   Allergic rhinitis   Asthma   History of Graves' disease   Metabolic acidosis, normal anion gap (NAG)   Leukopenia   Tobacco abuse   Normocytic anemia   1. Influenza Gwendolyn Clay is a healthy 31 year old woman with history of asthma who had multiple ED presentations with worsening cough, myalgias, and dyspnea leading up to her admission.  She received azithromycin and ceftriaxone in the ED for pneumonia and was discharged home, but immediately returned with worsening symptoms and was found to desaturate with ambulation and was admitted.  Chest radiography suggested multifocal pneumonia and she was found to have influenza infection.  She was started on oseltamavir and supportive care, and did not receive antibiotics.  Her symptoms improved, she was able to take adequate oral hydration, and breathing returned to baseline.  She was discharge home with return precautions and follow-up in [facility].  2. Pancytopenia She was found to have mild normocytic anemia, leukopenia, and borderline thrombocytopenia on admission.  Her anemia seems like it could be longstanding, but her WBCs and platelets are below prior recorded values.  Her ferritin is normal and she is HIV negative.  Could represent viral suppression form her influenza, and CBC should be checked again as outpatient once acute illness has resolved.  Discharge Vitals:   BP 114/75 (BP Location: Left Arm)   Pulse 78   Temp 98.7 F (37.1 C) (Oral)   Resp 18   Ht 5\' 10"  (1.778 m)   Wt 208 lb (94.3 kg)   LMP 08/31/2016   SpO2 97%   BMI 29.84 kg/m   Pertinent Labs, Studies, and Procedures:   CBC Latest Ref Rng & Units 09/03/2016 09/02/2016 07/19/2016  WBC 4.0 - 10.5 K/uL 3.2(L) 2.8(L) 5.3  Hemoglobin 12.0 - 15.0 g/dL 11.9(L) 11.5(L) 13.7  Hematocrit 36.0 - 46.0 % 35.5(L) 33.8(L) 39.3  Platelets 150 - 400 K/uL 160 169 226    CMP Latest Ref Rng & Units 09/04/2016 09/03/2016 09/02/2016  Glucose 65 - 99 mg/dL 87 76 86  BUN 6 - 20 mg/dL <6(E) <9(B) 6  Creatinine 0.44 - 1.00 mg/dL 2.84 1.32 4.40  Sodium 135 - 145 mmol/L 139 139 137  Potassium 3.5 - 5.1 mmol/L 3.8 3.8 3.5  Chloride 101 - 111 mmol/L 114(H) 112(H) 110  CO2 22 - 32 mmol/L 21(L) 19(L) 21(L)  Calcium 8.9 - 10.3 mg/dL 7.9(L) 8.0(L) 8.0(L)  Total Protein 6.5 - 8.1 g/dL - 5.7(L) 6.1(L)  Total Bilirubin 0.3 - 1.2 mg/dL - 0.6 1.1  Alkaline Phos 38 - 126 U/L - 45 48  AST 15 - 41 U/L - 57(H) 50(H)  ALT 14 - 54 U/L - 23 23   Component     Latest Ref Rng & Units 09/03/2016  Influenza  A By PCR     NEGATIVE POSITIVE (A)  Influenza B By PCR     NEGATIVE NEGATIVE   Component     Latest Ref Rng & Units 09/03/2016  Procalcitonin     ng/mL <0.10   Component     Latest Ref Rng & Units 09/03/2016  HIV-1 P24 Antigen - HIV24     NON REACTIVE NON REACTIVE  HIV 1/2 Antibodies     NON REACTIVE NON REACTIVE  Interpretation (HIV Ag Ab)      A non reactive test result means that HIV 1 or HIV 2 antibodies and HIV 1 p24 antigen were not detected in the specimen.   Lab Results  Component Value Date   FERRITIN 229 09/04/2016   Discharge Instructions: Discharge Instructions    Diet - low sodium heart healthy    Complete by:  As directed    Increase activity slowly    Complete by:  As directed      You were admitted to the hospital with body aches and shortness of breath which we found is due to the flu.  We treated you with IV fluids, nausea and pain medicines, and started Tamiflu.  At home, you can continue taking ibuprofen and tylenol, and I have prescribed you Tamiflu, Zofran, and Phenergan.  Make sure you drink lots of water or gatorade to stay hydrated.  If you get worse, with worse breathing, fevers, or shortness of breath, please call the clinic right away for an appointment, or call 911 or go to the ED if it is an emergency.  Signed: Alm Bustard,  MD 09/04/2016, 1:26 PM   Pager: [phone]

## 2016-09-04 NOTE — Progress Notes (Signed)
Subjective: Feeling much better than at admission.  Still have diffuse body aches, nausea was improved more with phenergan than zofran.  Poor appetite, but able to drink water without nausea or vomitng.  Ambulated without dyspnea or unsteadiness.  Feels ready to go home.  Objective:  Vital signs in last 24 hours: Vitals:   09/03/16 1318 09/03/16 1631 09/03/16 2051 09/04/16 0606  BP: 106/65 120/81 111/80 115/77  Pulse: 68 70 68 78  Resp: 22 18 18 18   Temp:  99.9 F (37.7 C) 99.4 F (37.4 C) 100.2 F (37.9 C)  TempSrc:  Oral Oral Oral  SpO2: 96% 99% 97% 94%  Weight:  208 lb (94.3 kg)    Height:  5\' 10"  (1.778 m)    100% on RA while ambulating  Physical Exam  Constitutional: She is oriented to person, place, and time. She appears well-developed and well-nourished. No distress.  Cardiovascular: Normal rate and regular rhythm.   Pulmonary/Chest:  Diffuse crackles No increased work of breathing Good air movement No wheezes  Neurological: She is alert and oriented to person, place, and time.  Psychiatric: She has a normal mood and affect. Her behavior is normal.   CBC Latest Ref Rng & Units 09/03/2016 09/02/2016 07/19/2016  WBC 4.0 - 10.5 K/uL 3.2(L) 2.8(L) 5.3  Hemoglobin 12.0 - 15.0 g/dL 11.9(L) 11.5(L) 13.7  Hematocrit 36.0 - 46.0 % 35.5(L) 33.8(L) 39.3  Platelets 150 - 400 K/uL 160 169 226   CMP Latest Ref Rng & Units 09/04/2016 09/03/2016 09/02/2016  Glucose 65 - 99 mg/dL 87 76 86  BUN 6 - 20 mg/dL <6(L<5(L) <8(V<5(L) 6  Creatinine 0.44 - 1.00 mg/dL 5.640.77 3.320.77 9.510.75  Sodium 135 - 145 mmol/L 139 139 137  Potassium 3.5 - 5.1 mmol/L 3.8 3.8 3.5  Chloride 101 - 111 mmol/L 114(H) 112(H) 110  CO2 22 - 32 mmol/L 21(L) 19(L) 21(L)  Calcium 8.9 - 10.3 mg/dL 7.9(L) 8.0(L) 8.0(L)  Total Protein 6.5 - 8.1 g/dL - 5.7(L) 6.1(L)  Total Bilirubin 0.3 - 1.2 mg/dL - 0.6 1.1  Alkaline Phos 38 - 126 U/L - 45 48  AST 15 - 41 U/L - 57(H) 50(H)  ALT 14 - 54 U/L - 23 23   Component     Latest Ref Rng  & Units 09/03/2016  Influenza A By PCR     NEGATIVE POSITIVE (A)  Influenza B By PCR     NEGATIVE NEGATIVE   Component     Latest Ref Rng & Units 09/03/2016  Procalcitonin     ng/mL <0.10   HIV nonreactive  Blood Cultures 09/03/16 Pending  Assessment/Plan:  Principal Problem:   Multifocal pneumonia Active Problems:   Allergic rhinitis   Asthma   History of Graves' disease   Metabolic acidosis, normal anion gap (NAG)   Leukopenia   Tobacco abuse   Normocytic anemia   Ms. Redmond PullingGbonah is a 31 year old mother of seven children with mild intermittent asthma, allergic rhinitis, and ongoing tobacco abuse with influenza pneumonia.  No evidence of bacterial pneumonia at this time.  She is improving and breathing well, at low risk of complications, and ready for discharge.  #Influenza #Pneumonia Multifocal pneumonia with laboratory confirmed influenza infection and low procalcitonin.  Low suspiscion for bacterial pneumonia at this time. -Follow-up blood cultures -Continue Tamiflu twice daily to complete a 5 day course -IVF -Zofran and phenergan PRN -Tylenol and ibuprofen PRN  #Normocytic Anemia #Leukopenia Viral illnesses are known to induce bone marrow suppression, and she  does have a decrease in her white count from baseline 4-5 as well as Hb from baseline 12-13. Her platelets are slightly above the upper limit of normal.  -Ferritin -Check CBC at outpatient follow-up after resolution of acute illness  #Non-anion Gap Metabolic Acidosis Improving.  Bicarbonate low on admission. She does not have any diarrheal losses, so I suspect it is likely from contraction alkalosis.  -Recheck BMET tomorrow to ensure correction with IV fluids as noted above  #Mild Intermittent Asthma No wheezing noted on my exam. She is not hypoxic at rest which is reassuring. -Continue DuoNeb every 6 hours -Recommend tobacco cessation as it worsens underlying disease  Fluids: D5 NS w/ 20 KCl at 100  mL/hr for 1 day Diet: clears DVT Prophylaxis: lovenox Code Status: full  Dispo: Anticipated discharge in approximately 0-1 day(s).   Alm Bustard, MD 09/04/2016, 7:30 AM Pager: [phone]

## 2016-09-04 NOTE — Telephone Encounter (Signed)
Hospital TOC. 

## 2016-09-04 NOTE — Progress Notes (Signed)
SATURATION QUALIFICATIONS: (This note is used to comply with regulatory documentation for home oxygen)  Patient Saturations on Room Air at Rest = 100%  Patient Saturations on Room Air while Ambulating = 100%  Please briefly explain why patient needs home oxygen: N/A 

## 2016-09-04 NOTE — Discharge Instructions (Signed)
You were admitted to the hospital with body aches and shortness of breath which we found is due to the flu.  We treated you with IV fluids, nausea and pain medicines, and started Tamiflu.  At home, you can continue taking ibuprofen and tylenol, and I have prescribed you Tamiflu, Zofran, and Phenergan.  Make sure you drink lots of water or gatorade to stay hydrated.  If you get worse, with worse breathing, fevers, or shortness of breath, please call the clinic right away for an appointment, or call 911 or go to the ED if it is an emergency.

## 2016-09-04 NOTE — Progress Notes (Signed)
Discharge paperwork reviewed with patient. No questions verbalized.  IV removed. Patient is ready for discharge.

## 2016-09-05 ENCOUNTER — Other Ambulatory Visit: Payer: Self-pay | Admitting: Internal Medicine

## 2016-09-05 ENCOUNTER — Telehealth: Payer: Self-pay | Admitting: Internal Medicine

## 2016-09-05 MED ORDER — OSELTAMIVIR PHOSPHATE 75 MG PO CAPS: 75.0000 mg | ORAL_CAPSULE | Freq: Two times a day (BID) | ORAL | 0 refills | Status: AC

## 2016-09-05 NOTE — Telephone Encounter (Signed)
Appt given for Friday @ 1345PM - stated she need an afternoon appt.

## 2016-09-05 NOTE — Telephone Encounter (Addendum)
Coughing up blood wants to talk to nurse  Advised patient to go to er but pt denied and wanted to wait for call back.

## 2016-09-05 NOTE — Telephone Encounter (Signed)
Talk to pt -  States she has been coughing up blood since discharged from the hospital. States bright red to pink in color mainly with a deep cough; contains no mucous only saliva and blood. Instructed she needs to go to the ED today -states she does not have a ride nor babysitter. Maybe can make arrangements for tomorrow. Informed best to go today if possible - voiced understanding.

## 2016-09-05 NOTE — Telephone Encounter (Signed)
Talked to Dr Clair GullingButcher,Attending, stated she will call pt.

## 2016-09-05 NOTE — Progress Notes (Signed)
I had a message from triage but the patient called due to coughing up of blood. Patient was discharged from the hospital yesterday when she was admitted for influenza and tested positive for influenza. She was discharged on Tamiflu but it was sent to the wrong pharmacy and she did not pick it up. She does state that she is still taking azithromycin which had been DC'd according to the discharge summary.  She states that while in the hospital she was coughing up dark blood. She states that currently she will cough for 10-15 seconds trying to get up the sputum in her chest. She can feel it rattling around in her chest but it is difficult to cough it up. After the 15 seconds of coughing she is then able to cough up what is spit and bright red blood. It is about quantity of a teaspoon.  Overall she is not feeling much better. She still tired and drained. She feels dizzy and lightheaded. She had chest pain last night but not currently. She still has shortness of breath. She does not have much of an appetite but is able to keep down liquids.  Assessment : Influenza infection and hemoptysis. I feel her hemoptysis is likely due to irritation from her prolonged coughing. She appears to be hemodynamically stable. I do not feel it would be beneficial to bring her into the ED. Her boyfriend will be able to pickup Tamiflu but not until tomorrow. She states she will be able to come to the clinic for an appointment on the 26th.  [facility] Tamiflu was sent to the appropriate pharmacy  Return precautions given

## 2016-09-05 NOTE — Telephone Encounter (Signed)
Lm for rtc 

## 2016-09-06 ENCOUNTER — Telehealth: Payer: Self-pay | Admitting: Family Medicine

## 2016-09-06 NOTE — Telephone Encounter (Signed)
APT. REMINDER CALL, LMTCB °

## 2016-09-07 ENCOUNTER — Ambulatory Visit: Payer: Medicaid Other

## 2016-09-07 NOTE — Telephone Encounter (Signed)
Pt did not come to her appt today

## 2016-09-07 NOTE — Telephone Encounter (Signed)
No answer today .  

## 2016-09-08 LAB — CULTURE, BLOOD (ROUTINE X 2)
CULTURE: NO GROWTH
Culture: NO GROWTH

## 2016-09-13 ENCOUNTER — Ambulatory Visit: Payer: Medicaid Other

## 2016-10-15 ENCOUNTER — Ambulatory Visit: Payer: Self-pay | Admitting: Surgery

## 2016-10-15 ENCOUNTER — Encounter: Payer: Self-pay | Admitting: Surgery

## 2016-10-15 NOTE — H&P (Signed)
Gwendolyn Clay 10/15/2016 4:18 PM Location: [facility] Patient #: [id number] DOB: May 23, 1986 Single / Language: [place] / Race: Black or African American Female  History of Present Illness Gwendolyn Sportsman MD; 10/15/2016 5:50 PM) The patient is a 31 year old female who presents with hemorrhoids. Note for "Hemorrhoids": Patient sent for surgical consultation at the request of her gynecologist, Dr. Coral Ceo. Concern for worsening hemorrhoids.  Pleasant young female. Has struggled with chronic constipation for most of her life. Has had hemorrhoids since she was a teenager. She has been trying to control the symptoms. She recalls being told when she was a teenager that she would need surgery to remove them. Apparently family hesitant for her to undergo surgery as a teenager. She's been trying to ride it out. She's had 2 children. Worse with pregnancies. She gets chronic discomfort. Throbbing. Painful during especially after bowel movements. Tries Tucks pads. Medication creams. Not much help. Does have chronic constipation moving her bowels once or twice a week. Not on a fiber supplement. Minimal smoker.  No personal nor family history of GI/colon cancer, inflammatory bowel disease, irritable bowel syndrome, allergy such as Celiac Sprue, dietary/dairy problems, colitis, ulcers nor gastritis. No recent sick contacts/gastroenteritis. No travel outside the country. No changes in diet. No dysphagia to solids or liquids. No significant heartburn or reflux. No hematochezia, hematemesis, coffee ground emesis. No evidence of prior gastric/peptic ulceration. Rather active. Can walk several miles without difficulty. It is a challenge for her to be able to get transportation to and from a doctor's office. Has missed visits with another partner a few times. Able to come in today.   Past Surgical History (April Staton, [place]; 10/15/2016 4:18 PM) No pertinent past surgical  history  Diagnostic Studies History (April Staton, [place]; 10/15/2016 4:18 PM) Colonoscopy never Mammogram 1-3 years ago Pap Smear 1-5 years ago  Allergies (April Staton, CMA; 10/15/2016 4:18 PM) No Known Drug Allergies 10/15/2016  Medication History (April Staton, [place]; 10/15/2016 4:18 PM) No Current Medications Medications Reconciled  Social History (April Staton, CMA; 10/15/2016 4:18 PM) Alcohol use Occasional alcohol use. Caffeine use Carbonated beverages. Illicit drug use Prefer to discuss with provider. Tobacco use Current every day smoker.  Family History (April Staton, [place]; 10/15/2016 4:18 PM) Alcohol Abuse Father, Mother. Arthritis Family Members In General. Cancer Family Members In General. Depression Family Members In General. Migraine Headache Father. Prostate Cancer Family Members In General. Seizure disorder Family Members In General. Thyroid problems Brother, Family Members In General.  Pregnancy / Birth History (April Staton, CMA; 10/15/2016 4:18 PM) Age at menarche 10 years. Contraceptive History Contraceptive implant, Intrauterine device, Oral contraceptives. Gravida 2 Irregular periods Length (months) of breastfeeding 3-6 Maternal age 72-25 Para 2  Other Problems (April Staton, CMA; 10/15/2016 4:18 PM) Asthma Back Pain Depression Gastroesophageal Reflux Disease Hemorrhoids Migraine Headache Other disease, cancer, significant illness Thyroid Disease     Review of Systems (April Staton CMA; 10/15/2016 4:18 PM) General Present- Night Sweats. Not Present- Appetite Loss, Chills, Fatigue, Fever, Weight Gain and Weight Loss. Skin Present- Dryness. Not Present- Change in Wart/Mole, Hives, Jaundice, New Lesions, Non-Healing Wounds, Rash and Ulcer. HEENT Present- Seasonal Allergies. Not Present- Earache, Hearing Loss, Hoarseness, Nose Bleed, Oral Ulcers, Ringing in the Ears, Sinus Pain, Sore Throat, Visual Disturbances, Wears  glasses/contact lenses and Yellow Eyes. Respiratory Present- Difficulty Breathing and Wheezing. Not Present- Bloody sputum, Chronic Cough and Snoring. Cardiovascular Present- Shortness of Breath. Not Present- Chest Pain, Difficulty Breathing Lying Down, Leg Cramps,  Palpitations, Rapid Heart Rate and Swelling of Extremities. Gastrointestinal Present- Hemorrhoids and Rectal Pain. Not Present- Abdominal Pain, Bloating, Bloody Stool, Change in Bowel Habits, Chronic diarrhea, Constipation, Difficulty Swallowing, Excessive gas, Gets full quickly at meals, Indigestion, Nausea and Vomiting. Female Genitourinary Not Present- Frequency, Nocturia, Painful Urination, Pelvic Pain and Urgency. Musculoskeletal Present- Back Pain, Joint Pain, Joint Stiffness and Muscle Pain. Not Present- Muscle Weakness and Swelling of Extremities. Psychiatric Not Present- Anxiety, Bipolar, Change in Sleep Pattern, Depression, Fearful and Frequent crying. Endocrine Present- Cold Intolerance, Hair Changes and Heat Intolerance. Not Present- Excessive Hunger, Hot flashes and New Diabetes. Hematology Present- Easy Bruising and Gland problems. Not Present- Blood Thinners, Excessive bleeding, HIV and Persistent Infections.  Vitals (April Staton CMA; 10/15/2016 4:19 PM) 10/15/2016 4:19 PM Weight: 206.25 lb Height: 70in Body Surface Area: 2.11 m Body Mass Index: 29.59 kg/m  Temp.: 98.8F(Oral)  Pulse: 83 (Regular)  P.OX: 94% (Room air) BP: 104/64 (Sitting, Left Arm, Standard)      Physical Exam Gwendolyn Sportsman MD; 10/15/2016 4:38 PM)  General Mental Status-Alert. General Appearance-Not in acute distress, Not Sickly. Orientation-Oriented X3. Hydration-Well hydrated. Voice-Normal.  Integumentary Global Assessment Upon inspection and palpation of skin surfaces of the - Axillae: non-tender, no inflammation or ulceration, no drainage. and Distribution of scalp and body hair is normal. General  Characteristics Temperature - normal warmth is noted.  Head and Neck Head-normocephalic, atraumatic with no lesions or palpable masses. Face Global Assessment - atraumatic, no absence of expression. Neck Global Assessment - no abnormal movements, no bruit auscultated on the right, no bruit auscultated on the left, no decreased range of motion, non-tender. Trachea-midline. Thyroid Gland Characteristics - non-tender.  Eye Eyeball - Left-Extraocular movements intact, No Nystagmus. Eyeball - Right-Extraocular movements intact, No Nystagmus. Cornea - Left-No Hazy. Cornea - Right-No Hazy. Sclera/Conjunctiva - Left-No scleral icterus, No Discharge. Sclera/Conjunctiva - Right-No scleral icterus, No Discharge. Pupil - Left-Direct reaction to light normal. Pupil - Right-Direct reaction to light normal.  ENMT Ears Pinna - Left - no drainage observed, no generalized tenderness observed. Right - no drainage observed, no generalized tenderness observed. Nose and Sinuses External Inspection of the Nose - no destructive lesion observed. Inspection of the nares - Left - quiet respiration. Right - quiet respiration. Mouth and Throat Lips - Upper Lip - no fissures observed, no pallor noted. Lower Lip - no fissures observed, no pallor noted. Nasopharynx - no discharge present. Oral Cavity/Oropharynx - Tongue - no dryness observed. Oral Mucosa - no cyanosis observed. Hypopharynx - no evidence of airway distress observed.  Chest and Lung Exam Inspection Movements - Normal and Symmetrical. Accessory muscles - No use of accessory muscles in breathing. Palpation Palpation of the chest reveals - Non-tender. Auscultation Breath sounds - Normal and Clear.  Cardiovascular Auscultation Rhythm - Regular. Murmurs & Other Heart Sounds - Auscultation of the heart reveals - No Murmurs and No Systolic Clicks.  Abdomen Inspection Inspection of the abdomen reveals - No Visible peristalsis  and No Abnormal pulsations. Umbilicus - No Bleeding, No Urine drainage. Palpation/Percussion Palpation and Percussion of the abdomen reveal - Soft, Non Tender, No Rebound tenderness, No Rigidity (guarding) and No Cutaneous hyperesthesia. Note: Abdomen soft. Nontender, nondistended. No guarding. No diastasis. No umbilical nor other hernias  Female Genitourinary Sexual Maturity Tanner 5 - Adult hair pattern. Note: Nontender. No inguinal hernias. No inguinal lymphadenopathy. No major vaginal bleeding nor foul discharge  Rectal Note: Numerous external hemorrhoids involving all 3 piles. Overall partial internal hemorrhoidal prolapse left  lateral and right posterior. Sensitive & uncomfortable.  Perianal skin clean with good hygiene. No pruritis ani. No pilonidal disease. No fissure. No abscess/fistula. Normal sphincter tone. No condyloma warts. Tolerates digital rectal exam but rather sensitive. No rectal masses. Exam done with assistance of female Medical Assistant in the room.  Peripheral Vascular Upper Extremity Inspection - Left - No Cyanotic nailbeds, Not Ischemic. Right - No Cyanotic nailbeds, Not Ischemic.  Neurologic Neurologic evaluation reveals -normal attention span and ability to concentrate, able to name objects and repeat phrases. Appropriate fund of knowledge , normal sensation and normal coordination. Mental Status Affect - not angry, not paranoid. Cranial Nerves-Normal Bilaterally. Gait-Normal.  Neuropsychiatric Mental status exam performed with findings of-able to articulate well with normal speech/language, rate, volume and coherence, thought content normal with ability to perform basic computations and apply abstract reasoning and no evidence of hallucinations, delusions, obsessions or homicidal/suicidal ideation.  Musculoskeletal Global Assessment Spine, Ribs and Pelvis - no instability, subluxation or laxity. Right Upper Extremity - no instability,  subluxation or laxity.  Lymphatic Head & Neck  General Head & Neck Lymphatics: Bilateral - Description - No Localized lymphadenopathy. Axillary  General Axillary Region: Bilateral - Description - No Localized lymphadenopathy. Femoral & Inguinal  Generalized Femoral & Inguinal Lymphatics: Left - Description - No Localized lymphadenopathy. Right - Description - No Localized lymphadenopathy.    Assessment & Plan Gwendolyn Clay(Laquasha Groome C. Maize Brittingham MD; 10/15/2016 4:48 PM)  EXTERNAL HEMORRHOIDS WITH COMPLICATION (K64.4) Impression: 3 piles internal/external hemorrhoids with pain and discomfort in the setting of chronic constipation.  I recommend she correct her constipation. I think it is the reason that she is having these hemorrhoid issues  I do not think that these will get better without surgery. She is ready to consider it now. Internal hemorrhoid and ligation and pexy. Excision of remaining external hemorrhoidal piles.  The anatomy & physiology of the anorectal region was discussed. The pathophysiology of hemorrhoids and differential diagnosis was discussed. Natural history progression was discussed. I stressed the importance of a bowel regimen to have daily soft bowel movements to minimize progression of disease. Goal of one BM / day ideal. Use of wet wipes, warm baths, avoiding straining, etc were emphasized.  Educational handouts further explaining the pathology, treatment options, and bowel regimen were given as well. The patient expressed understanding.  PROLAPSED INTERNAL HEMORRHOIDS, GRADE 2 (K64.1)  Current Plans Pt Education - [facility] Hemorrhoids (Lani Mendiola): discussed with patient and provided information. Pt Education - Pamphlet Given - The Hemorrhoid Book: discussed with patient and provided information. ENCOUNTER FOR PREOPERATIVE EXAMINATION FOR GENERAL SURGICAL PROCEDURE (Z01.818)  Current Plans You are being scheduled for surgery- Our schedulers will call you.  You should hear from our  office's scheduling department within 5 working days about the location, date, and time of surgery. We try to make accommodations for patient's preferences in scheduling surgery, but sometimes the OR schedule or the surgeon's schedule prevents us from making those accommodations.  If you have not heard from our office [phone]) in 5 working days, call the office and ask for your surgeon's nurse.  If you have other questions about your diagnosis, plan, or surgery, call the office and ask for your surgeon's nurse.  Pt Education - [facility] Rectal Prep for Anorectal outpatient/office surgery: discussed with patient and provided information. Pt Education - [facility] Rectal Surgery HCI (Duffy Dantonio): discussed with patient and provided information. CHRONIC CONSTIPATION (K59.09)  Current Plans Pt Education - [facility] Constipation (AT) Pt Education - [facility] Good Bowel  Health (Phiona Ramnauth)  Gwendolyn Clay, M.D., F.A.C.S. Gastrointestinal and Minimally Invasive Surgery [facility] [address], [address] [place], [place] [address] [phone] Main / Paging

## 2016-11-23 ENCOUNTER — Encounter (HOSPITAL_BASED_OUTPATIENT_CLINIC_OR_DEPARTMENT_OTHER): Payer: Self-pay | Admitting: *Deleted

## 2016-11-23 NOTE — Progress Notes (Signed)
NPO AFTER MN.  ARRIVE AT [id number].  NEEDS HG AND URINE PREG.  PT VERBALIZED UNDERSTANDING RECTAL INSTRUCTIONS GIVEN BY OFFICE.

## 2016-11-28 ENCOUNTER — Encounter (HOSPITAL_BASED_OUTPATIENT_CLINIC_OR_DEPARTMENT_OTHER): Payer: Self-pay | Admitting: *Deleted

## 2016-11-29 ENCOUNTER — Ambulatory Visit (HOSPITAL_COMMUNITY): Payer: Medicaid Other | Admitting: Anesthesiology

## 2016-11-29 ENCOUNTER — Ambulatory Visit (HOSPITAL_COMMUNITY)
Admission: RE | Admit: 2016-11-29 | Discharge: 2016-11-29 | Disposition: A | Discharge status: Home / Self Care | Payer: Medicaid Other | Source: Ambulatory Visit | Attending: Surgery | Admitting: Surgery

## 2016-11-29 ENCOUNTER — Encounter (HOSPITAL_COMMUNITY)
Admission: RE | Disposition: A | Discharge status: Home / Self Care | Payer: Self-pay | Source: Ambulatory Visit | Attending: Surgery

## 2016-11-29 ENCOUNTER — Ambulatory Visit (HOSPITAL_COMMUNITY): Payer: Medicaid Other

## 2016-11-29 ENCOUNTER — Encounter (HOSPITAL_COMMUNITY): Payer: Self-pay

## 2016-11-29 DIAGNOSIS — Z818 Family history of other mental and behavioral disorders: Secondary | ICD-10-CM | POA: Insufficient documentation

## 2016-11-29 DIAGNOSIS — K5909 Other constipation: Secondary | ICD-10-CM | POA: Insufficient documentation

## 2016-11-29 DIAGNOSIS — F17200 Nicotine dependence, unspecified, uncomplicated: Secondary | ICD-10-CM | POA: Insufficient documentation

## 2016-11-29 DIAGNOSIS — Z809 Family history of malignant neoplasm, unspecified: Secondary | ICD-10-CM | POA: Diagnosis not present

## 2016-11-29 DIAGNOSIS — Z811 Family history of alcohol abuse and dependence: Secondary | ICD-10-CM | POA: Insufficient documentation

## 2016-11-29 DIAGNOSIS — Z820 Family history of epilepsy and other diseases of the nervous system: Secondary | ICD-10-CM | POA: Diagnosis not present

## 2016-11-29 DIAGNOSIS — K643 Fourth degree hemorrhoids: Secondary | ICD-10-CM | POA: Insufficient documentation

## 2016-11-29 DIAGNOSIS — G43909 Migraine, unspecified, not intractable, without status migrainosus: Secondary | ICD-10-CM | POA: Diagnosis not present

## 2016-11-29 DIAGNOSIS — M549 Dorsalgia, unspecified: Secondary | ICD-10-CM | POA: Insufficient documentation

## 2016-11-29 DIAGNOSIS — K644 Residual hemorrhoidal skin tags: Secondary | ICD-10-CM | POA: Diagnosis not present

## 2016-11-29 DIAGNOSIS — K219 Gastro-esophageal reflux disease without esophagitis: Secondary | ICD-10-CM | POA: Diagnosis not present

## 2016-11-29 DIAGNOSIS — Z8261 Family history of arthritis: Secondary | ICD-10-CM | POA: Insufficient documentation

## 2016-11-29 DIAGNOSIS — Z8349 Family history of other endocrine, nutritional and metabolic diseases: Secondary | ICD-10-CM | POA: Insufficient documentation

## 2016-11-29 DIAGNOSIS — E039 Hypothyroidism, unspecified: Secondary | ICD-10-CM | POA: Diagnosis not present

## 2016-11-29 DIAGNOSIS — J45909 Unspecified asthma, uncomplicated: Secondary | ICD-10-CM | POA: Insufficient documentation

## 2016-11-29 DIAGNOSIS — Z8042 Family history of malignant neoplasm of prostate: Secondary | ICD-10-CM | POA: Insufficient documentation

## 2016-11-29 DIAGNOSIS — Z01818 Encounter for other preprocedural examination: Secondary | ICD-10-CM

## 2016-11-29 HISTORY — DX: Unspecified hemorrhoids: K64.9

## 2016-11-29 HISTORY — DX: Postprocedural hypothyroidism: E89.0

## 2016-11-29 HISTORY — DX: Personal history of other infectious and parasitic diseases: Z86.19

## 2016-11-29 HISTORY — DX: Personal history of pneumonia (recurrent): Z87.01

## 2016-11-29 HISTORY — PX: EVALUATION UNDER ANESTHESIA WITH HEMORRHOIDECTOMY: SHX5624

## 2016-11-29 HISTORY — DX: Other allergy status, other than to drugs and biological substances: Z91.09

## 2016-11-29 HISTORY — DX: Personal history of other endocrine, nutritional and metabolic disease: Z86.39

## 2016-11-29 LAB — PREGNANCY, URINE: PREG TEST UR: NEGATIVE

## 2016-11-29 LAB — HEMOGLOBIN: Hemoglobin: 12.8 g/dL (ref 12.0–15.0)

## 2016-11-29 SURGERY — EXAM UNDER ANESTHESIA WITH HEMORRHOIDECTOMY
Anesthesia: General

## 2016-11-29 MED ORDER — SUCCINYLCHOLINE CHLORIDE 200 MG/10ML IV SOSY
PREFILLED_SYRINGE | INTRAVENOUS | Status: DC | PRN
  Administered 2016-11-29: 120 mg via INTRAVENOUS

## 2016-11-29 MED ORDER — ONDANSETRON HCL 4 MG/2ML IJ SOLN
INTRAMUSCULAR | Status: DC | PRN
  Administered 2016-11-29: 4 mg via INTRAVENOUS

## 2016-11-29 MED ORDER — HYDROMORPHONE HCL 2 MG/ML IJ SOLN
INTRAMUSCULAR | Status: AC
  Filled 2016-11-29: qty 1

## 2016-11-29 MED ORDER — BUPIVACAINE LIPOSOME 1.3 % IJ SUSP
20.0000 mL | INTRAMUSCULAR | Status: DC
  Filled 2016-11-29: qty 20

## 2016-11-29 MED ORDER — PROPOFOL 10 MG/ML IV BOLUS
INTRAVENOUS | Status: AC
  Filled 2016-11-29: qty 20

## 2016-11-29 MED ORDER — MIDAZOLAM HCL 2 MG/2ML IJ SOLN
INTRAMUSCULAR | Status: DC | PRN
  Administered 2016-11-29: 2 mg via INTRAVENOUS

## 2016-11-29 MED ORDER — FENTANYL CITRATE (PF) 250 MCG/5ML IJ SOLN
INTRAMUSCULAR | Status: AC
  Filled 2016-11-29: qty 5

## 2016-11-29 MED ORDER — ONDANSETRON HCL 4 MG/2ML IJ SOLN
INTRAMUSCULAR | Status: AC
  Filled 2016-11-29: qty 2

## 2016-11-29 MED ORDER — PROMETHAZINE HCL 25 MG/ML IJ SOLN: 6.2500 mg | INTRAMUSCULAR | Status: DC | PRN

## 2016-11-29 MED ORDER — DEXAMETHASONE SODIUM PHOSPHATE 10 MG/ML IJ SOLN
INTRAMUSCULAR | Status: AC
  Filled 2016-11-29: qty 1

## 2016-11-29 MED ORDER — CHLORHEXIDINE GLUCONATE CLOTH 2 % EX PADS: 6.0000 | MEDICATED_PAD | Freq: Once | CUTANEOUS | Status: DC

## 2016-11-29 MED ORDER — CELECOXIB 200 MG PO CAPS
400.0000 mg | ORAL_CAPSULE | ORAL | Status: AC
  Administered 2016-11-29: 400 mg via ORAL
  Filled 2016-11-29: qty 2

## 2016-11-29 MED ORDER — SODIUM CHLORIDE 0.9 % IR SOLN
Status: DC | PRN
  Administered 2016-11-29: 1000 mL

## 2016-11-29 MED ORDER — AMBULATORY NON FORMULARY MEDICATION: 1.0000 "application " | Freq: Four times a day (QID) | 2 refills | Status: DC

## 2016-11-29 MED ORDER — DIBUCAINE 1 % RE OINT
TOPICAL_OINTMENT | RECTAL | Status: AC
  Filled 2016-11-29: qty 28

## 2016-11-29 MED ORDER — SCOPOLAMINE 1 MG/3DAYS TD PT72
1.0000 | MEDICATED_PATCH | TRANSDERMAL | Status: DC
  Administered 2016-11-29: 1.5 mg via TRANSDERMAL

## 2016-11-29 MED ORDER — OXYCODONE HCL 5 MG PO TABS: 5.0000 mg | ORAL_TABLET | ORAL | 0 refills | Status: DC | PRN

## 2016-11-29 MED ORDER — ACETAMINOPHEN 500 MG PO TABS
1000.0000 mg | ORAL_TABLET | ORAL | Status: AC
  Administered 2016-11-29: 1000 mg via ORAL
  Filled 2016-11-29: qty 2

## 2016-11-29 MED ORDER — SUGAMMADEX SODIUM 200 MG/2ML IV SOLN
INTRAVENOUS | Status: AC
  Filled 2016-11-29: qty 2

## 2016-11-29 MED ORDER — LIDOCAINE 2% (20 MG/ML) 5 ML SYRINGE
INTRAMUSCULAR | Status: AC
  Filled 2016-11-29: qty 5

## 2016-11-29 MED ORDER — ROCURONIUM BROMIDE 50 MG/5ML IV SOSY
PREFILLED_SYRINGE | INTRAVENOUS | Status: AC
  Filled 2016-11-29: qty 5

## 2016-11-29 MED ORDER — LACTATED RINGERS IV SOLN
INTRAVENOUS | Status: DC
  Administered 2016-11-29: 1000 mL via INTRAVENOUS
  Administered 2016-11-29: 12:00:00 via INTRAVENOUS

## 2016-11-29 MED ORDER — BUPIVACAINE HCL (PF) 0.25 % IJ SOLN
INTRAMUSCULAR | Status: AC
  Filled 2016-11-29: qty 30

## 2016-11-29 MED ORDER — HYDROMORPHONE HCL 2 MG/ML IJ SOLN
0.2500 mg | INTRAMUSCULAR | Status: DC | PRN
  Administered 2016-11-29 (×2): 0.5 mg via INTRAVENOUS

## 2016-11-29 MED ORDER — NAPROXEN 500 MG PO TABS: 500.0000 mg | ORAL_TABLET | Freq: Two times a day (BID) | ORAL | 1 refills | Status: AC | PRN

## 2016-11-29 MED ORDER — FENTANYL CITRATE (PF) 250 MCG/5ML IJ SOLN
INTRAMUSCULAR | Status: DC | PRN
  Administered 2016-11-29 (×2): 50 ug via INTRAVENOUS
  Administered 2016-11-29: 100 ug via INTRAVENOUS
  Administered 2016-11-29: 50 ug via INTRAVENOUS

## 2016-11-29 MED ORDER — OXYCODONE HCL 5 MG PO TABS
5.0000 mg | ORAL_TABLET | ORAL | Status: AC
  Administered 2016-11-29: 5 mg via ORAL
  Filled 2016-11-29: qty 1

## 2016-11-29 MED ORDER — PROPOFOL 10 MG/ML IV BOLUS
INTRAVENOUS | Status: DC | PRN
  Administered 2016-11-29: 200 mg via INTRAVENOUS
  Administered 2016-11-29: 60 mg via INTRAVENOUS

## 2016-11-29 MED ORDER — SCOPOLAMINE 1 MG/3DAYS TD PT72
MEDICATED_PATCH | TRANSDERMAL | Status: AC
  Administered 2016-11-29: 1.5 mg via TRANSDERMAL
  Filled 2016-11-29: qty 1

## 2016-11-29 MED ORDER — MIDAZOLAM HCL 2 MG/2ML IJ SOLN
INTRAMUSCULAR | Status: AC
  Filled 2016-11-29: qty 2

## 2016-11-29 MED ORDER — LIDOCAINE 2% (20 MG/ML) 5 ML SYRINGE
INTRAMUSCULAR | Status: DC | PRN
Start: 2016-11-29 — End: 2016-11-29
  Administered 2016-11-29: 60 mg via INTRAVENOUS

## 2016-11-29 MED ORDER — DEXAMETHASONE SODIUM PHOSPHATE 10 MG/ML IJ SOLN
INTRAMUSCULAR | Status: DC | PRN
  Administered 2016-11-29: 10 mg via INTRAVENOUS

## 2016-11-29 MED ORDER — METRONIDAZOLE IN NACL 5-0.79 MG/ML-% IV SOLN
500.0000 mg | INTRAVENOUS | Status: AC
  Administered 2016-11-29: 500 mg via INTRAVENOUS
  Filled 2016-11-29: qty 100

## 2016-11-29 MED ORDER — GABAPENTIN 300 MG PO CAPS
300.0000 mg | ORAL_CAPSULE | ORAL | Status: AC
  Administered 2016-11-29: 300 mg via ORAL
  Filled 2016-11-29: qty 1

## 2016-11-29 MED ORDER — BUPIVACAINE HCL (PF) 0.25 % IJ SOLN
INTRAMUSCULAR | Status: DC | PRN
  Administered 2016-11-29: 30 mL

## 2016-11-29 MED ORDER — CEFAZOLIN SODIUM-DEXTROSE 2-4 GM/100ML-% IV SOLN
2.0000 g | INTRAVENOUS | Status: AC
  Administered 2016-11-29: 2 g via INTRAVENOUS
  Filled 2016-11-29: qty 100

## 2016-11-29 MED ORDER — BUPIVACAINE LIPOSOME 1.3 % IJ SUSP
INTRAMUSCULAR | Status: DC | PRN
  Administered 2016-11-29: 20 mL

## 2016-11-29 MED ORDER — DIBUCAINE 1 % RE OINT
TOPICAL_OINTMENT | RECTAL | Status: DC | PRN
  Administered 2016-11-29: 1 via RECTAL

## 2016-11-29 SURGICAL SUPPLY — 33 items
APL SKNCLS STERI-STRIP NONHPOA (GAUZE/BANDAGES/DRESSINGS) ×2
BENZOIN TINCTURE PRP APPL 2/3 (GAUZE/BANDAGES/DRESSINGS) ×5 IMPLANT
BLADE SURG 15 STRL LF DISP TIS (BLADE) ×1 IMPLANT
BLADE SURG 15 STRL SS (BLADE) ×3
BNDG GAUZE ELAST 4 BULKY (GAUZE/BANDAGES/DRESSINGS) ×2 IMPLANT
BRIEF STRETCH FOR OB PAD LRG (UNDERPADS AND DIAPERS) ×3 IMPLANT
COVER SURGICAL LIGHT HANDLE (MISCELLANEOUS) ×3 IMPLANT
DECANTER SPIKE VIAL GLASS SM (MISCELLANEOUS) ×1 IMPLANT
DRAPE LAPAROTOMY T 102X78X121 (DRAPES) ×3 IMPLANT
DRSG PAD ABDOMINAL 8X10 ST (GAUZE/BANDAGES/DRESSINGS) ×2 IMPLANT
ELECT PENCIL ROCKER SW 15FT (MISCELLANEOUS) ×3 IMPLANT
ELECT REM PT RETURN 15FT ADLT (MISCELLANEOUS) ×3 IMPLANT
GAUZE SPONGE 4X4 12PLY STRL (GAUZE/BANDAGES/DRESSINGS) IMPLANT
GAUZE SPONGE 4X4 16PLY XRAY LF (GAUZE/BANDAGES/DRESSINGS) ×3 IMPLANT
GLOVE ECLIPSE 8.0 STRL XLNG CF (GLOVE) ×3 IMPLANT
GLOVE INDICATOR 8.0 STRL GRN (GLOVE) ×3 IMPLANT
GOWN STRL REUS W/TWL XL LVL3 (GOWN DISPOSABLE) ×6 IMPLANT
KIT BASIN OR (CUSTOM PROCEDURE TRAY) ×3 IMPLANT
LUBRICANT JELLY K Y 4OZ (MISCELLANEOUS) ×3 IMPLANT
NEEDLE HYPO 22GX1.5 SAFETY (NEEDLE) ×3 IMPLANT
PACK BASIC VI WITH GOWN DISP (CUSTOM PROCEDURE TRAY) ×3 IMPLANT
SCRUB TECHNI CARE 4 OZ NO DYE (MISCELLANEOUS) ×3 IMPLANT
SHEARS HARMONIC 9CM CVD (BLADE) IMPLANT
SUT CHROMIC 2 0 SH (SUTURE) ×4 IMPLANT
SUT CHROMIC 3 0 SH 27 (SUTURE) ×4 IMPLANT
SUT VIC AB 2-0 SH 27 (SUTURE)
SUT VIC AB 2-0 SH 27X BRD (SUTURE) IMPLANT
SUT VIC AB 2-0 UR6 27 (SUTURE) ×12 IMPLANT
SYR 20CC LL (SYRINGE) ×3 IMPLANT
TAPE CLOTH SURG 4X10 WHT LF (GAUZE/BANDAGES/DRESSINGS) ×2 IMPLANT
TOWEL OR 17X26 10 PK STRL BLUE (TOWEL DISPOSABLE) ×3 IMPLANT
TOWEL OR NON WOVEN STRL DISP B (DISPOSABLE) ×3 IMPLANT
YANKAUER SUCT BULB TIP 10FT TU (MISCELLANEOUS) ×3 IMPLANT

## 2016-11-29 NOTE — Anesthesia Procedure Notes (Signed)
Procedure Name: Intubation Date/Time: 11/29/2016 11:35 AM Performed by: Dione Booze Pre-anesthesia Checklist: Emergency Drugs available, Suction available, Patient being monitored and Patient identified Patient Re-evaluated:Patient Re-evaluated prior to inductionOxygen Delivery Method: Circle system utilized Preoxygenation: Pre-oxygenation with 100% oxygen Laryngoscope Size: Mac and 4 Grade View: Grade I Tube type: Oral Tube size: 7.5 mm Number of attempts: 1 Airway Equipment and Method: Stylet Placement Confirmation: ETT inserted through vocal cords under direct vision,  positive ETCO2 and breath sounds checked- equal and bilateral Secured at: 21 cm Tube secured with: Tape Dental Injury: Teeth and Oropharynx as per pre-operative assessment

## 2016-11-29 NOTE — Discharge Instructions (Signed)
ANORECTAL SURGERY:  °POST OPERATIVE INSTRUCTIONS ° °###################################################################### ° °EAT °Gradually transition to a high fiber diet with a fiber supplement over the next few weeks after discharge.  Start with a pureed / full liquid diet (see below) ° °WALK °Walk an hour a day.  Control your pain to do that.   ° °CONTROL PAIN °Control pain so that you can walk, sleep, tolerate sneezing/coughing, go up/down stairs. ° °HAVE A BOWEL MOVEMENT DAILY °Keep your bowels regular to avoid problems.  OK to try a laxative to override constipation.  OK to use an antidairrheal to slow down diarrhea.  Call if not better after 2 tries ° °CALL IF YOU HAVE PROBLEMS/CONCERNS °Call if you are still struggling despite following these instructions. °Call if you have concerns not answered by these instructions ° °###################################################################### ° ° ° °1. Take your usually prescribed home medications unless otherwise directed. °2. DIET: Follow a light bland diet the first 24 hours after arrival home, such as soup, liquids, crackers, etc.  Be sure to include lots of fluids daily.  Avoid fast food or heavy meals as your are more likely to get nauseated.  Eat a low fat the next few days after surgery.   °3. PAIN CONTROL: °a. Pain is best controlled by a usual combination of three different methods TOGETHER: °i. Ice/Heat °ii. Over the counter pain medication °iii. Prescription pain medication °b. Most patients will experience some swelling and discomfort in the anus/rectal area. and incisions.  Ice packs or heat (30-60 minutes up to 6 times a day) will help. Use ice for the first few days to help decrease swelling and bruising, then switch to heat such as warm towels, sitz baths, warm baths, etc to help relax tight/sore spots and speed recovery.  Some people prefer to use ice alone, heat alone, alternating between ice & heat.  Experiment to what works for you.   Swelling and bruising can take several weeks to resolve.   °c. It is helpful to take an over-the-counter pain medication regularly for the first few weeks.  Choose one of the following that works best for you: °i. Naproxen (Aleve, etc)  Two 220mg tabs twice a day °ii. Ibuprofen (Advil, etc) Three 200mg tabs four times a day (every meal & bedtime) °iii. Acetaminophen (Tylenol, etc) 500-650mg four times a day (every meal & bedtime) °d. A  prescription for pain medication (such as oxycodone, hydrocodone, etc) should be given to you upon discharge.  Take your pain medication as prescribed.  °i. If you are having problems/concerns with the prescription medicine (does not control pain, nausea, vomiting, rash, itching, etc), please call us [phone] to see if we need to switch you to a different pain medicine that will work better for you and/or control your side effect better. °ii. If you need a refill on your pain medication, please contact your pharmacy.  They will contact our office to request authorization. Prescriptions will not be filled after 5 pm or on week-ends. ° °Use a Sitz Bath 4-8 times a day for relief ° ° °Sitz Bath °A sitz bath is a warm water bath taken in the sitting position that covers only the hips and buttocks. It may be used for either healing or hygiene purposes. Sitz baths are also used to relieve pain, itching, or muscle spasms. The water may contain medicine. Moist heat will help you heal and relax.  °HOME CARE INSTRUCTIONS  °Take 3 to 4 sitz baths a day. °1. Fill the bathtub   half full with warm water. °2. Sit in the water and open the drain a little. °3. Turn on the warm water to keep the tub half full. Keep the water running constantly. °4. Soak in the water for 15 to 20 minutes. °5. After the sitz bath, pat the affected area dry first. ° ° °4. KEEP YOUR BOWELS REGULAR °a. The goal is one bowel movement a day °b. Avoid getting constipated.  Between the surgery and the pain medications, it  is common to experience some constipation.  Increasing fluid intake and taking a fiber supplement (such as Metamucil, Citrucel, FiberCon, MiraLax, etc) 1-2 times a day regularly will usually help prevent this problem from occurring.  A mild laxative (prune juice, Milk of Magnesia, MiraLax, etc) should be taken according to package directions if there are no bowel movements after 48 hours. °c. Watch out for diarrhea.  If you have many loose bowel movements, simplify your diet to bland foods & liquids for a few days.  Stop any stool softeners and decrease your fiber supplement.  Switching to mild anti-diarrheal medications (Kayopectate, Pepto Bismol) can help.  If this worsens or does not improve, please call us. ° °5. Wound Care ° °a. Remove your bandages the day after surgery.  Unless discharge instructions indicate otherwise, leave your bandage dry and in place overnight.  Remove the bandage during your first bowel movement.   °b. Wear an absorbent pad or soft cotton gauze in your underwear as needed to catch any drainage and help keep the area  °c. Keep the area clean and dry.  Bathe / shower every day.  Keep the area clean by showering / bathing over the incision / wound.   It is okay to soak an open wound to help wash it.  Wet wipes or showers / gentle washing after bowel movements is often less traumatic than regular toilet paper. °d. You will often notice bleeding with bowel movements.  This should slow down by the end of the first week of surgery °e. Expect some drainage.  This should slow down, too, by the end of the first week of surgery.  Wear an absorbent pad or soft cotton gauze in your underwear until the drainage stops. ° °6. ACTIVITIES as tolerated:   °a. You may resume regular (light) daily activities beginning the next day--such as daily self-care, walking, climbing stairs--gradually increasing activities as tolerated.  If you can walk 30 minutes without difficulty, it is safe to try more intense  activity such as jogging, treadmill, bicycling, low-impact aerobics, swimming, etc. °b. Save the most intensive and strenuous activity for last such as sit-ups, heavy lifting, contact sports, etc  Refrain from any heavy lifting or straining until you are off narcotics for pain control.   °c. DO NOT PUSH THROUGH PAIN.  Let pain be your guide: If it hurts to do something, don't do it.  Pain is your body warning you to avoid that activity for another week until the pain goes down. °d. You may drive when you are no longer taking prescription pain medication, you can comfortably sit for long periods of time, and you can safely maneuver your car and apply brakes. °e. You may have sexual intercourse when it is comfortable.  °7. FOLLOW UP in our office °a. Please call [facility] at [phone] to set up an appointment to see your surgeon in the office for a follow-up appointment approximately 2 weeks after your surgery. °b. Make sure that you call for   this appointment the day you arrive home to insure a convenient appointment time. 10. IF YOU HAVE DISABILITY OR FAMILY LEAVE FORMS, BRING THEM TO THE OFFICE FOR PROCESSING.  DO NOT GIVE THEM TO YOUR DOCTOR.        WHEN TO CALL US [phone]: 1. Poor pain control 2. Reactions / problems with new medications (rash/itching, nausea, etc)  3. Fever over 101.5 F (38.5 C) 4. Inability to urinate 5. Nausea and/or vomiting 6. Worsening swelling or bruising 7. Continued bleeding from incision. 8. Increased pain, redness, or drainage from the incision  The clinic staff is available to answer your questions during regular business hours (8:30am-5pm).  Please dont hesitate to call and ask to speak to one of our nurses for clinical concerns.   [facility] is always on call at the hospitals   If you have a medical emergency, go to the nearest emergency room or call 911.    [facility] [address], [address],  [place], [place]  [address] ? MAIN: [phone] ? TOLL FREE: [phone] ? FAX [phone]) [phone] [email or web address]   HEMORRHOIDS  The rectum is the last foot of your colon, and it naturally stretches to hold stool.  Hemorrhoidal piles are natural clusters of blood vessels that help the rectum and anal canal stretch to hold stool and allow bowel movements to eliminate feces.   Hemorrhoids are abnormally swollen blood vessels in the rectum.  Too much pressure in the rectum causes hemorrhoids by forcing blood to stretch and bulge the walls of the veins, sometimes even rupturing them.  Hemorrhoids can become like varicose veins you might see on a person's legs.  Most people will develop a flare of hemorrhoids in their lifetime.  When bulging hemorrhoidal veins are irritated, they can swell, burn, itch, cause pain, and bleed.  Most flares will calm down gradually own within a few weeks.  However, once hemorrhoids are created, they are difficult to get rid of completely and tend to flare more easily than the first flare.   Fortunately, good habits and simple medical treatment usually control hemorrhoids well, and surgery is needed only in severe cases. Types of Hemorrhoids:  Internal hemorrhoids usually don't initially hurt or itch; they are deep inside the rectum and usually have no sensation. If they begin to push out (prolapse), pain and burning can occur.  However, internal hemorrhoids can bleed.  Anal bleeding should not be ignored since bleeding could come from a dangerous source like colorectal cancer, so persistent rectal bleeding should be investigated by a doctor, sometimes with a colonoscopy.  External hemorrhoids cause most of the symptoms - pain, burning, and itching. Nonirritated hemorrhoids can look like small skin tags coming out of the anus.   Thrombosed hemorrhoids can form when a hemorrhoid blood vessel bursts and causes the hemorrhoid to suddenly swell.  A purple blood clot can  form in it and become an excruciatingly painful lump at the anus. Because of these unpleasant symptoms, immediate incision and drainage by a surgeon at an office visit can provide much relief of the pain.    PREVENTION Avoiding the most frequent causes listed below will prevent most cases of hemorrhoids: Constipation Hard stools Diarrhea  Constant sitting  Straining with bowel movements Sitting on the toilet for a long time  Severe coughing  episodes Pregnancy / Childbirth  Heavy Lifting  Sometimes avoiding the above triggers is difficult:  How can you avoid sitting all  day if you have a seated job? Also, we try to avoid coughing and diarrhea, but sometimes its beyond your control.  Still, there are some practical hints to help: Keep the anal and genital area clean.  Moistened tissues such as flushable wet wipes are less irritating than toilet paper.  Using irrigating showers or bottle irrigation washing gently cleans this sensitive area.   Avoid dry toilet paper when cleaning after bowel movements.  Marland Kitchen. Keep the anal and genital area dry.  Lightly pat the rectal area dry.  Avoid rubbing.  Talcum or baby powders can help GET YOUR STOOLS SOFT.   This is the most important way to prevent irritated hemorrhoids.  Hard stools are like sandpaper to the anorectal canal and will cause more problems.  The goal: ONE SOFT BOWEL MOVEMENT A DAY!  BMs from every other day to 3 times a day is a tolerable range Treat coughing, diarrhea and constipation early since irritated hemorrhoids may soon follow.  If your main job activity is seated, always stand or walk during your breaks. Make it a point to stand and walk at least 5 minutes every hour and try to shift frequently in your chair to avoid direct rectal pressure.  Always exhale as you strain or lift. Don't hold your breath.  Do not delay or try to prevent a bowel movement when the urge is present. Exercise regularly (walking or jogging 60 minutes a day) to  stimulate the bowels to move. No reading or other activity while on the toilet. If bowel movements take longer than 5 minutes, you are too constipated. AVOID CONSTIPATION Drink plenty of liquids (1 1/2 to 2 quarts of water and other fluids a day unless fluid restricted for another medical condition). Liquids that contain caffeine (coffee a, tea, soft drinks) can be dehydrating and should be avoided until constipation is controlled. Consider minimizing milk, as dairy products may be constipating. Eat plenty of fiber (30g a day ideal, more if needed).  Fiber is the undigested part of plant food that passes into the colon, acting as natures broom to encourage bowel motility and movement.  Fiber can absorb and hold large amounts of water. This results in a larger, bulkier stool, which is soft and easier to pass.  Eating foods high in fiber - 12 servings - such as  Vegetables: Root (potatoes, carrots, turnips), Leafy green (lettuce, salad greens, celery, spinach), High residue (cabbage, broccoli, etc.) Fruit: Fresh, Dried (prunes, apricots, cherries), Stewed (applesauce)  Whole grain breads, pasta, whole wheat Bran cereals, muffins, etc. Consider adding supplemental bulking fiber which retains large volumes of water: Psyllium ground seeds (native plant from [place])--available as Metamucil, Konsyl, Effersyllium, Per Diem Fiber, or the less expensive generic forms.  Citrucel  (methylcellulose wood fiber) . FiberCon (Polycarbophil) Polyethylene Glycol - and artificial fiber commonly called Miralax or Glycolax.  It is helpful for people with gassy or bloated feelings with regular fiber Flax Seed - a less gassy natural fiber  Laxatives can be useful for a short period if constipation is severe Osmotics (Milk of Magnesia, Fleets Phospho-Soda, Magnesium Citrate)  Stimulants (Senokot,   Castor Oil,  Dulcolax, Ex-Lax)    Laxatives are not a good long-term solution as it can stress the bowels and cause  too much mineral loss and dehydration.   Avoid taking laxatives for more than 7 days in a row.  AVOID DIARRHEA Switch to liquids and simpler foods for a few days to avoid stressing your intestines further. Avoid  dairy products (especially milk & ice cream) for a short time.  The intestines often can lose the ability to digest lactose when stressed. Avoid foods that cause gassiness or bloating.  Typical foods include beans and other legumes, cabbage, broccoli, and dairy foods.  Every person has some sensitivity to other foods, so listen to your body and avoid those foods that trigger problems for you. Adding fiber (Citrucel, Metamucil, FiberCon, Flax seed, Miralax) gradually can help thicken stools by absorbing excess fluid and retrain the intestines to act more normally.  Slowly increase the dose over a few weeks.  Too much fiber too soon can backfire and cause cramping & bloating. Probiotics (such as active yogurt, Align, etc) may help repopulate the intestines and colon with normal bacteria and calm down a sensitive digestive tract.  Most studies show it to be of mild help, though, and such products can be costly. Medicines: Bismuth subsalicylate (ex. Kayopectate, Pepto Bismol) every 30 minutes for up to 6 doses can help control diarrhea.  Avoid if pregnant. Loperamide (Immodium) can slow down diarrhea.  Start with two tablets (  total) first and then try one tablet every 6 hours.  Avoid if you are having fevers or severe pain.  If you are not better or start feeling worse, stop all medicines and call your doctor for advice Call your doctor if you are getting worse or not better.  Sometimes further testing (cultures, endoscopy, X-ray studies, bloodwork, etc) may be needed to help diagnose and treat the cause of the diarrhea.  TROUBLESHOOTING IRREGULAR BOWELS 1) Avoid extremes of bowel movements (no bad constipation/diarrhea) 2) Miralax 17gm mixed in 8oz. water or juice-daily. May use BID as  needed.  3) Gas-x,Phazyme, etc. as needed for gas & bloating.  4) Soft,bland diet. No spicy,greasy,fried foods.  5) Prilosec over-the-counter as needed  6) May hold gluten/wheat products from diet to see if symptoms improve.  7)  May try probiotics (Align, Activa, etc) to help calm the bowels down 7) If symptoms become worse call back immediately.   TREATMENT OF HEMORRHOID FLARE If these preventive measures fail, you must take action right away! Hemorrhoids are one condition that can be mild in the morning and become intolerable by nightfall. Most hemorrhoidal flares take several weeks to calm down.  These suggestions can help: Warm soaks.  This helps more than any topical medication.  Use up to 8 times a day.  Usually sitz baths or sitting in a warm bathtub helps.  Sitting on moist warm towels are helpful.  Switching to ice packs/cool compresses can be helpful  Use a Sitz Bath 4-8 times a day for relief A sitz bath is a warm water bath taken in the sitting position that covers only the hips and buttocks. It may be used for either healing or hygiene purposes. Sitz baths are also used to relieve pain, itching, or muscle spasms. The water may contain medicine. Moist heat will help you heal and relax.  HOME CARE INSTRUCTIONS  Take 3 to 4 sitz baths a day. 6. Fill the bathtub half full with warm water. 7. Sit in the water and open the drain a little. 8. Turn on the warm water to keep the tub half full. Keep the water running constantly. 9. Soak in the water for 15 to 20 minutes. 10. After the sitz bath, pat the affected area dry first. SEEK MEDICAL CARE IF:  You get worse instead of better. Stop the sitz baths if you get worse.  Normalize your bowels.  Extremes of diarrhea or constipation will make hemorrhoids worse.  One soft bowel movement a day is the goal.  Fiber can help get your bowels regular Wet wipes instead of toilet paper Pain control with a NSAID such as ibuprofen (Advil) or  naproxen (Aleve) or acetaminophen (Tylenol) around the clock.  Narcotics are constipating and should be minimized if possible Topical creams contain steroids (bydrocortisone) or local anesthetic (xylocaine) can help make pain and itching more tolerable.   EVALUATION If hemorrhoids are still causing problems, you could benefit by an evaluation by a surgeon.  The surgeon will obtain a history and examine you.  If hemorrhoids are diagnosed, some therapies can be offered in the office, usually with an anoscope into the less sensitive area of the rectum: -injection of hemorrhoids (sclerotherapy) can scar the blood vessels of the swollen/enlarged hemorrhoids to help shrink them down to a more normal size -rubber banding of the enlarged hemorrhoids to help shrink them down to a more normal size -drainage of the blood clot causing a thrombosed hemorrhoid,  to relieve the severe pain   While 90% of the time such problems from hemorrhoids can be managed without preceding to surgery, sometimes the hemorrhoids require a operation to control the problem (uncontrolled bleeding, prolapse, pain, etc.).   This involves being placed under general anesthesia where the surgeon can confirm the diagnosis and remove, suture, or staple the hemorrhoid(s).  Your surgeon can help you treat the problem appropriately.

## 2016-11-29 NOTE — Transfer of Care (Signed)
Immediate Anesthesia Transfer of Care Note  Patient: Gwendolyn Clay  Procedure(s) Performed: Procedure(s): EXAM UNDER ANESTHESIA WITH HEMORRHOIDECTOMY X3,  LIGATION AND PEXY (N/A)  Patient Location: PACU  Anesthesia Type:General  Level of Consciousness: awake, alert , oriented and patient cooperative  Airway & Oxygen Therapy: Patient Spontanous Breathing and Patient connected to face mask oxygen  Post-op Assessment: Report given to RN and Post -op Vital signs reviewed and stable  Post vital signs: Reviewed  Last Vitals:  Vitals:   11/29/16 0815  BP: 104/66  Pulse: (!) 52  Resp: 16  Temp: 36.8 C    Last Pain:  Vitals:   11/29/16 0815  TempSrc: Oral      Patients Stated Pain Goal: 3 (11/29/16 0948)  Complications: No apparent anesthesia complications

## 2016-11-29 NOTE — H&P (Signed)
Gwendolyn Clay 10/15/2016 4:18 PM Location: [facility] Patient #: [id number] DOB: 1985/11/04 Single / Language: [place] / Race: Black or African American Female  Patient Care Team: Renaye Rakers, MD as PCP - General (Family Medicine) Brock Bad, MD as Consulting Physician (Obstetrics and Gynecology)   History of Present Illness Ardeth Sportsman MD; 10/15/2016 5:50 PM) The patient is a 31 year old female who presents with hemorrhoids. Note for "Hemorrhoids": Patient sent for surgical consultation at the request of her gynecologist, Dr. Coral Ceo. Concern for worsening hemorrhoids.  Pleasant young female. Has struggled with chronic constipation for most of her life. Has had hemorrhoids since she was a teenager. She has been trying to control the symptoms. She recalls being told when she was a teenager that she would need surgery to remove them. Apparently family hesitant for her to undergo surgery as a teenager. She's been trying to ride it out. She's had 2 children. Worse with pregnancies. She gets chronic discomfort. Throbbing. Painful during especially after bowel movements. Tries Tucks pads. Medication creams. Not much help. Does have chronic constipation moving her bowels once or twice a week. Not on a fiber supplement. Minimal smoker.  No personal nor family history of GI/colon cancer, inflammatory bowel disease, irritable bowel syndrome, allergy such as Celiac Sprue, dietary/dairy problems, colitis, ulcers nor gastritis. No recent sick contacts/gastroenteritis. No travel outside the country. No changes in diet. No dysphagia to solids or liquids. No significant heartburn or reflux. No hematochezia, hematemesis, coffee ground emesis. No evidence of prior gastric/peptic ulceration. Rather active. Can walk several miles without difficulty. It is a challenge for her to be able to get transportation to and from a doctor's office. Has missed visits  with another partner a few times. Able to come in today.   Past Surgical History (April Staton, [place]; 10/15/2016 4:18 PM) No pertinent past surgical history   Diagnostic Studies History (April Staton, [place]; 10/15/2016 4:18 PM) Colonoscopy  never Mammogram  1-3 years ago Pap Smear  1-5 years ago  Allergies (April Staton, CMA; 10/15/2016 4:18 PM) No Known Drug Allergies 10/15/2016  Medication History (April Staton, [place]; 10/15/2016 4:18 PM) No Current Medications Medications Reconciled  Social History (April Staton, CMA; 10/15/2016 4:18 PM) Alcohol use  Occasional alcohol use. Caffeine use  Carbonated beverages. Illicit drug use  Prefer to discuss with provider. Tobacco use  Current every day smoker.  Family History (April Staton, [place]; 10/15/2016 4:18 PM) Alcohol Abuse  Father, Mother. Arthritis  Family Members In General. Cancer  Family Members In General. Depression  Family Members In General. Migraine Headache  Father. Prostate Cancer  Family Members In General. Seizure disorder  Family Members In General. Thyroid problems  Brother, Family Members In General.  Pregnancy / Birth History (April Staton, CMA; 10/15/2016 4:18 PM) Age at menarche  10 years. Contraceptive History  Contraceptive implant, Intrauterine device, Oral contraceptives. Gravida  2 Irregular periods  Length (months) of breastfeeding  3-6 Maternal age  8-25 Para  2  Other Problems (April Staton, CMA; 10/15/2016 4:18 PM) Asthma  Back Pain  Depression  Gastroesophageal Reflux Disease  Hemorrhoids  Migraine Headache  Other disease, cancer, significant illness  Thyroid Disease     Review of Systems (April Staton CMA; 10/15/2016 4:18 PM) General Present- Night Sweats. Not Present- Appetite Loss, Chills, Fatigue, Fever, Weight Gain and Weight Loss. Skin Present- Dryness. Not Present- Change in Wart/Mole, Hives, Jaundice, New Lesions, Non-Healing Wounds, Rash and Ulcer. HEENT Present-  Seasonal Allergies. Not  Present- Earache, Hearing Loss, Hoarseness, Nose Bleed, Oral Ulcers, Ringing in the Ears, Sinus Pain, Sore Throat, Visual Disturbances, Wears glasses/contact lenses and Yellow Eyes. Respiratory Present- Difficulty Breathing and Wheezing. Not Present- Bloody sputum, Chronic Cough and Snoring. Cardiovascular Present- Shortness of Breath. Not Present- Chest Pain, Difficulty Breathing Lying Down, Leg Cramps, Palpitations, Rapid Heart Rate and Swelling of Extremities. Gastrointestinal Present- Hemorrhoids and Rectal Pain. Not Present- Abdominal Pain, Bloating, Bloody Stool, Change in Bowel Habits, Chronic diarrhea, Constipation, Difficulty Swallowing, Excessive gas, Gets full quickly at meals, Indigestion, Nausea and Vomiting. Female Genitourinary Not Present- Frequency, Nocturia, Painful Urination, Pelvic Pain and Urgency. Musculoskeletal Present- Back Pain, Joint Pain, Joint Stiffness and Muscle Pain. Not Present- Muscle Weakness and Swelling of Extremities. Psychiatric Not Present- Anxiety, Bipolar, Change in Sleep Pattern, Depression, Fearful and Frequent crying. Endocrine Present- Cold Intolerance, Hair Changes and Heat Intolerance. Not Present- Excessive Hunger, Hot flashes and New Diabetes. Hematology Present- Easy Bruising and Gland problems. Not Present- Blood Thinners, Excessive bleeding, HIV and Persistent Infections.  Vitals (April Staton CMA; 10/15/2016 4:19 PM) 10/15/2016 4:19 PM Weight: 206.25 lb Height: 70in Body Surface Area: 2.11 m Body Mass Index: 29.59 kg/m  Temp.: 98.20F(Oral)  Pulse: 83 (Regular)  P.OX: 94% (Room air) BP: 104/64 (Sitting, Left Arm, Standard)   Ht  (1.778 m)   Wt 93.4 kg (206 lb)   LMP 11/09/2016 (Approximate)   BMI 29.56 kg/m      Physical Exam Ardeth Sportsman MD; 10/15/2016 4:38 PM) General Mental Status-Alert. General Appearance-Not in acute distress, Not Sickly. Orientation-Oriented X3. Hydration-Well  hydrated. Voice-Normal.  Integumentary Global Assessment Upon inspection and palpation of skin surfaces of the - Axillae: non-tender, no inflammation or ulceration, no drainage. and Distribution of scalp and body hair is normal. General Characteristics Temperature - normal warmth is noted.  Head and Neck Head-normocephalic, atraumatic with no lesions or palpable masses. Face Global Assessment - atraumatic, no absence of expression. Neck Global Assessment - no abnormal movements, no bruit auscultated on the right, no bruit auscultated on the left, no decreased range of motion, non-tender. Trachea-midline. Thyroid Gland Characteristics - non-tender.  Eye Eyeball - Left-Extraocular movements intact, No Nystagmus. Eyeball - Right-Extraocular movements intact, No Nystagmus. Cornea - Left-No Hazy. Cornea - Right-No Hazy. Sclera/Conjunctiva - Left-No scleral icterus, No Discharge. Sclera/Conjunctiva - Right-No scleral icterus, No Discharge. Pupil - Left-Direct reaction to light normal. Pupil - Right-Direct reaction to light normal.  ENMT Ears Pinna - Left - no drainage observed, no generalized tenderness observed. Right - no drainage observed, no generalized tenderness observed. Nose and Sinuses External Inspection of the Nose - no destructive lesion observed. Inspection of the nares - Left - quiet respiration. Right - quiet respiration. Mouth and Throat Lips - Upper Lip - no fissures observed, no pallor noted. Lower Lip - no fissures observed, no pallor noted. Nasopharynx - no discharge present. Oral Cavity/Oropharynx - Tongue - no dryness observed. Oral Mucosa - no cyanosis observed. Hypopharynx - no evidence of airway distress observed.  Chest and Lung Exam Inspection Movements - Normal and Symmetrical. Accessory muscles - No use of accessory muscles in breathing. Palpation Palpation of the chest reveals - Non-tender. Auscultation Breath sounds - Normal  and Clear.  Cardiovascular Auscultation Rhythm - Regular. Murmurs & Other Heart Sounds - Auscultation of the heart reveals - No Murmurs and No Systolic Clicks.  Abdomen Inspection Inspection of the abdomen reveals - No Visible peristalsis and No Abnormal pulsations. Umbilicus - No Bleeding, No Urine drainage. Palpation/Percussion  Palpation and Percussion of the abdomen reveal - Soft, Non Tender, No Rebound tenderness, No Rigidity (guarding) and No Cutaneous hyperesthesia. Note: Abdomen soft. Nontender, nondistended. No guarding. No diastasis. No umbilical nor other hernias   Female Genitourinary Sexual Maturity Tanner 5 - Adult hair pattern. Note: Nontender. No inguinal hernias. No inguinal lymphadenopathy. No major vaginal bleeding nor foul discharge   Rectal Note: Numerous external hemorrhoids involving all 3 piles. Overall partial internal hemorrhoidal prolapse left lateral and right posterior. Sensitive & uncomfortable.  Perianal skin clean with good hygiene. No pruritis ani. No pilonidal disease. No fissure. No abscess/fistula. Normal sphincter tone. No condyloma warts. Tolerates digital rectal exam but rather sensitive. No rectal masses. Exam done with assistance of female Medical Assistant in the room.   Peripheral Vascular Upper Extremity Inspection - Left - No Cyanotic nailbeds, Not Ischemic. Right - No Cyanotic nailbeds, Not Ischemic.  Neurologic Neurologic evaluation reveals -normal attention span and ability to concentrate, able to name objects and repeat phrases. Appropriate fund of knowledge , normal sensation and normal coordination. Mental Status Affect - not angry, not paranoid. Cranial Nerves-Normal Bilaterally. Gait-Normal.  Neuropsychiatric Mental status exam performed with findings of-able to articulate well with normal speech/language, rate, volume and coherence, thought content normal with ability to perform basic computations and apply  abstract reasoning and no evidence of hallucinations, delusions, obsessions or homicidal/suicidal ideation.  Musculoskeletal Global Assessment Spine, Ribs and Pelvis - no instability, subluxation or laxity. Right Upper Extremity - no instability, subluxation or laxity.  Lymphatic Head & Neck  General Head & Neck Lymphatics: Bilateral - Description - No Localized lymphadenopathy. Axillary  General Axillary Region: Bilateral - Description - No Localized lymphadenopathy. Femoral & Inguinal  Generalized Femoral & Inguinal Lymphatics: Left - Description - No Localized lymphadenopathy. Right - Description - No Localized lymphadenopathy.    Assessment & Plan  EXTERNAL HEMORRHOIDS WITH COMPLICATION (K64.4) Impression: 3 piles internal/external hemorrhoids with pain and discomfort in the setting of chronic constipation.  I recommend she correct her constipation. I think it is the reason that she is having these hemorrhoid issues  I do not think that these will get better without surgery. She is ready to consider it now. Internal hemorrhoid and ligation and pexy. Excision of remaining external hemorrhoidal piles.  The anatomy & physiology of the anorectal region was discussed. The pathophysiology of hemorrhoids and differential diagnosis was discussed. Natural history progression was discussed. I stressed the importance of a bowel regimen to have daily soft bowel movements to minimize progression of disease. Goal of one BM / day ideal. Use of wet wipes, warm baths, avoiding straining, etc were emphasized.  Educational handouts further explaining the pathology, treatment options, and bowel regimen were given as well. The patient expressed understanding.  PROLAPSED INTERNAL HEMORRHOIDS, GRADE 2 (K64.1) Current Plans Pt Education - [facility] Hemorrhoids (Samanthajo Payano): discussed with patient and provided information. Pt Education - Pamphlet Given - The Hemorrhoid Book: discussed with patient and provided  information. ENCOUNTER FOR PREOPERATIVE EXAMINATION FOR GENERAL SURGICAL PROCEDURE (Z01.818) Current Plans You are being scheduled for surgery- Our schedulers will call you.  You should hear from our office's scheduling department within 5 working days about the location, date, and time of surgery. We try to make accommodations for patient's preferences in scheduling surgery, but sometimes the OR schedule or the surgeon's schedule prevents [place] from making those accommodations.  If you have not heard from our office [phone]) in 5 working days, call the office and ask for your surgeon's  nurse.  If you have other questions about your diagnosis, plan, or surgery, call the office and ask for your surgeon's nurse.  Pt Education - [facility] Rectal Prep for Anorectal outpatient/office surgery: discussed with patient and provided information. Pt Education - [facility] Rectal Surgery HCI (Aidan Moten): discussed with patient and provided information. CHRONIC CONSTIPATION (K59.09) Current Plans Pt Education - [facility] Constipation (AT) Pt Education - [facility] Good Bowel Health (Omolola Mittman)  Ardeth Sportsman, M.D., F.A.C.S. Gastrointestinal and Minimally Invasive Surgery [facility] [address], [address] [place], [place] [address] [phone] Main / Paging

## 2016-11-29 NOTE — Op Note (Signed)
11/29/2016  12:43 PM  PATIENT:  Gwendolyn Clay  31 y.o. female  Patient Care Team: Renaye Rakers, MD as PCP - General (Family Medicine) Brock Bad, MD as Consulting Physician (Obstetrics and Gynecology) Karie Soda, MD as Consulting Physician (General Surgery)  PRE-OPERATIVE DIAGNOSIS:  Symptomatic hemorrhoids  POST-OPERATIVE DIAGNOSIS:  Grade 4 internal & external symptomatic hemorrhoids  PROCEDURE:    Examination under anesthesia Removal of internal/external hemorrhoids x 3  Internal hemorrhoidal ligation and pexy  SURGEON:  Ardeth Sportsman, MD  ANESTHESIA:   General Anorectal & Local field block  0.25% bupivacaine with epinephrine at the beginning of the case. Liposomal bupivacaine (Experel) at the end of the case.  EBL:  Total I/O In: 1000 [I.V.:1000] Out: 50 [Blood:50].  See operative record  Delay start of Pharmacological VTE agent (>24hrs) due to surgical blood loss or risk of bleeding:  NO  DRAINS: NONE  SPECIMEN:  Source of Specimen:  Internal/external hemorrhoids  DISPOSITION OF SPECIMEN:  PATHOLOGY  COUNTS:  YES  PLAN OF CARE: Discharge home after PACU  PATIENT DISPOSITION:  PACU - hemodynamically stable.  INDICATION: Pleasant patient with struggles with hemorrhoids.  All three piles prolapsed out grade 4 with a significant external component.  Not able to be managed in the office despite an improved bowel regimen.  I recommended examination under anesthesia and surgical treatment:  The anatomy & physiology of the anorectal region was discussed.  The pathophysiology of hemorrhoids and differential diagnosis was discussed.  Natural history risks without surgery was discussed.   I stressed the importance of a bowel regimen to have daily soft bowel movements to minimize progression of disease.  Interventions such as sclerotherapy & banding were discussed.  The patient's symptoms are not adequately controlled by medicines and other non-operative  treatments.  I feel the risks & problems of no surgery outweigh the operative risks; therefore, I recommended surgery to treat the hemorrhoids by ligation, pexy, and possible resection.  Risks such as bleeding, infection, need for further treatment, heart attack, death, and other risks were discussed.   I noted a good likelihood this will help address the problem.  Goals of post-operative recovery were discussed as well.  Possibility that this will not correct all symptoms was explained.  Post-operative pain, bleeding, constipation, urinary difficulties, and other problems after surgery were discussed.  We will work to minimize complications.   Educational handouts further explaining the pathology, treatment options, and bowel regimen were given as well.  Questions were answered.  The patient expresses understanding & wishes to proceed with surgery.  OR FINDINGS: Three piles grade 4 prolapsed internal hemorrhoids.  Somewhat rotated anatomy.  Dominant left posterior > anterior midline > right posterior piles  DESCRIPTION:   Informed consent was confirmed. Patient underwent general anesthesia without difficulty. Patient was placed into prone positioning.  The perianal region was prepped and draped in sterile fashion. Surgical time-out confirmed our plan.  I did digital rectal examination and then transitioned over to anoscopy to get a sense of the anatomy.  Findings noted above.   I did internal hemorrhoidal ligation and a hexagonal pattern 6 locations (left posterior/lateral/anterior, right posterior/lateral/anterior locations.    I used a 2-0 Vicryl suture on a UR-6 needle in a figure-of-eight fashion 6 cm proximal to the anal verge. I excised the internal hemorrhoid longitudinally in a fusiform biconcave fashion, sparing rectal wall & anoderm to avoid narrowing.  Some locations I could just do ligation and pexy.  However there were  significant enlarged redundant tissue, so I excised the hemorrhoids  longitudinally at the Left posterior, Left anteriorlateral (mostly midline), Right posterior locations. I then ran that stitch longitudinally more distally to close the hemorrhoidectomy wound to the anal verge over a large Hill-Furgeson retarctor to avoid narrowing of the anal canal.  I then tied that stitch down to cause a hemorrhoidopexy.  I closed the external part of the wounds with 2-0 chromic suture, leaving the last 5 mm open to allow natural drainage.   I redid anoscopy examination.   At completion of this, all hemorrhoids had been removed or reduced into the rectum.  There is no prolapse.  External anatomy looked much more normal.  Hemostasis was good.  Patient is being extubated go to go to the recovery room.  I had discussed postop care in detail with the patient in the preop holding area.  Instructions for post-operative recovery and prescriptions are written. I discussed operative findings, updated the patient's status, discussed probable steps to recovery, and gave postoperative recommendations to the patient's significant other.  Recommendations were made.  Questions were answered.  He expressed understanding & appreciation.  Ardeth Sportsman, M.D., F.A.C.S. Gastrointestinal and Minimally Invasive Surgery [facility] [address] [place], [place] [address] [phone] Main / Paging

## 2016-11-29 NOTE — Anesthesia Postprocedure Evaluation (Addendum)
Anesthesia Post Note  Patient: KISHA MESSMAN  Procedure(s) Performed: Procedure(s) (LRB): EXAM UNDER ANESTHESIA WITH HEMORRHOIDECTOMY X3,  LIGATION AND PEXY (N/A)  Patient location during evaluation: PACU Anesthesia Type: General Level of consciousness: sedated Pain management: pain level controlled Vital Signs Assessment: post-procedure vital signs reviewed and stable Respiratory status: spontaneous breathing and respiratory function stable Cardiovascular status: stable Anesthetic complications: no       Last Vitals:  Vitals:   11/29/16 1345 11/29/16 1400  BP: 112/68 109/76  Pulse: (!) 52 (!) 59  Resp: (!) 7 18  Temp:  36.7 C    Last Pain:  Vitals:   11/29/16 1400  TempSrc:   PainSc: 0-No pain                 Miguel Medal DANIEL

## 2016-11-29 NOTE — Interval H&P Note (Signed)
History and Physical Interval Note:  11/29/2016 10:56 AM  Gwendolyn Clay  has presented today for surgery, with the diagnosis of Symptomatic hemorrhoids  The various methods of treatment have been discussed with the patient and family. After consideration of risks, benefits and other options for treatment, the patient has consented to  Procedure(s): EXAM UNDER ANESTHESIA WITH HEMORRHOIDECTOMY LIGATION AND PEXY (N/A) as a surgical intervention .  The patient's history has been reviewed, patient examined, no change in status, stable for surgery.  I have reviewed the patient's chart and labs.  Questions were answered to the patient's satisfaction.     [name].

## 2016-11-29 NOTE — Anesthesia Preprocedure Evaluation (Signed)
Anesthesia Evaluation  Patient identified by MRN, date of birth, ID band Patient awake    Reviewed: Allergy & Precautions, H&P , NPO status , Patient's Chart, lab work & pertinent test results  Airway Mallampati: II  TM Distance: >3 FB Neck ROM: full    Dental  (+) Teeth Intact, Dental Advisory Given   Pulmonary asthma , neg recent URI, Current Smoker,    breath sounds clear to auscultation       Cardiovascular negative cardio ROS   Rhythm:regular Rate:Normal     Neuro/Psych negative neurological ROS  negative psych ROS   GI/Hepatic negative GI ROS, Neg liver ROS,   Endo/Other  Hypothyroidism   Renal/GU negative Renal ROS     Musculoskeletal   Abdominal   Peds  Hematology   Anesthesia Other Findings       Reproductive/Obstetrics negative OB ROS                             Anesthesia Physical  Anesthesia Plan  ASA: II  Anesthesia Plan: General   Post-op Pain Management:    Induction: Intravenous  Airway Management Planned: Oral ETT  Additional Equipment:   Intra-op Plan:   Post-operative Plan: Extubation in OR  Informed Consent: I have reviewed the patients History and Physical, chart, labs and discussed the procedure including the risks, benefits and alternatives for the proposed anesthesia with the patient or authorized representative who has indicated his/her understanding and acceptance.   Dental Advisory Given  Plan Discussed with: CRNA, Anesthesiologist and Surgeon  Anesthesia Plan Comments:         Anesthesia Quick Evaluation

## 2017-01-12 NOTE — Addendum Note (Signed)
Addendum  created 01/12/17 0824 by Ramin Zoll, MD   Sign clinical note    

## 2017-04-23 ENCOUNTER — Ambulatory Visit: Payer: Self-pay | Admitting: Allergy and Immunology

## 2017-04-25 ENCOUNTER — Ambulatory Visit: Payer: Self-pay | Admitting: Allergy

## 2017-04-26 ENCOUNTER — Other Ambulatory Visit: Payer: Self-pay

## 2017-05-02 ENCOUNTER — Telehealth: Payer: Self-pay | Admitting: Allergy

## 2017-05-02 NOTE — Telephone Encounter (Signed)
Received fax for refill on Proair. Patient was last seen on 02/15/2016. Request was denied. Patient needs to keep appt on 05/09/2017 for refills.

## 2017-05-09 ENCOUNTER — Ambulatory Visit (INDEPENDENT_AMBULATORY_CARE_PROVIDER_SITE_OTHER): Payer: Medicaid Other | Admitting: Allergy

## 2017-05-09 ENCOUNTER — Encounter: Payer: Self-pay | Admitting: Allergy

## 2017-05-09 VITALS — BP 118/66 | HR 84 | Resp 20 | Ht 70.0 in | Wt 211.8 lb

## 2017-05-09 DIAGNOSIS — J4550 Severe persistent asthma, uncomplicated: Secondary | ICD-10-CM | POA: Insufficient documentation

## 2017-05-09 DIAGNOSIS — J309 Allergic rhinitis, unspecified: Secondary | ICD-10-CM

## 2017-05-09 DIAGNOSIS — H1010 Acute atopic conjunctivitis, unspecified eye: Secondary | ICD-10-CM

## 2017-05-09 DIAGNOSIS — L209 Atopic dermatitis, unspecified: Secondary | ICD-10-CM

## 2017-05-09 NOTE — Patient Instructions (Addendum)
1. Allergic rhinoconjunctivitis -  Afrin taper dose 2 sprays for 3 days, then 1 spray for 2 days then stop - Begin Nasonex 1 spray per nostril daily - Consider nasal saline rinse daily - Begin Claritin 10 mg in the morning. If you are tolerating this dose after 1 week, then increase to 10 mg a day.  - Prednison 60 mg for 4 days, 40 mg for 4, and 20 for 2 day  2. Severe persistent asthma, unspecified whether complicated - Use ProAir 2 puffs every 4 hours as needed - Begin Singulair 5 mg at bedtime - Zone 2 blood work  3. Contact dermatitis, unspecified contact dermatitis type, unspecified trigger - Begin triamcinolone 0.1% to affected areas 1-2 times a day  4. Follow up with lab work and office visit in 3 months

## 2017-05-09 NOTE — Progress Notes (Signed)
FOLLOW UP  Date of Service/Encounter:  05/09/17   Assessment:   Allergic rhinoconjunctivitis   Severe persistent asthma, unspecified whether complicated   Atopic dermatitis  Plan/Recommendations:     1. Allergic rhinoconjunctivitis -  Severe nasal obstruction on exam with poor sense of smell and taste.   Unable to visualize polyps due to severe turbinate edema.  She has been using Afrin daily for several years now.  Discussed rebound congestion with chronic afrin use.  Discussed need to discontinue use of Afrin and will quickly taper off: 2 sprays for 3 days, then 1 spray for 2 days then stop - to help improve smell, taste and obstruction will do a Prednisone 60 mg for 4 days, 40 mg for 4, and 20 for 2 day - Begin Nasonex 1 spray per nostril daily - Consider nasal saline rinse daily - Begin Claritin 10 mg in the morning.   2. Severe persistent asthma - poorly controlled - continue symbicort 160 2 puffs twice a day - Use ProAir 2 puffs every 4 hours as needed.  Discussed role of albuterol and frequency of use.  At this time she is using too much albuterol at a time.   - Begin Singulair 5 mg at bedtime.  If you are tolerating this dose after 1 week, then increase to 10 mg a day.  - will obtain environmental blood work to determine eligibility for Xolair.    - she has no eosinophilia on previous CBC and thus would not qualify for Nucala or Fasenra  3. Atopic dermatitis - Begin triamcinolone 0.1% to affected areas 1-2 times a day  4. Follow up in 3 months   Subjective:    History obtained from: chart review and patient interview.  Gwendolyn Clay Primary Care Provider is Renaye Rakers,  MD.     Gwendolyn Clay is a 31 y.o. female presenting for a follow up visit. She was last in the office 02/15/2016 and was seen by Dr. Willa Rough for evaluation of cough, wheeze, allergic rhinoconjunctivitis, and tobacco use. At that time she was started on [employer], levocetirizine 5 mg every  evening, Singulair 10 mg at bedtime, Proventil 0.083% nebulizer solution, Flonase nasal spray, and Symbicort 160/4.5. At that visit she was reporting nasal congestion, chest congestion, post tussive emesis of phlegm, and shortness of breath with an increase in albuterol nebulizer use. She was started on a prednisone taper at that time with a follow-up appointment for 2 weeks or sooner if needed.  Since that visit, Gwendolyn Clay reports that she has not been doing well with asthma, allergic rhinitis, and atopic dermatitis. She reports shortness of breath, feeling of heaviness in her lungs, nighttime awakenings due to cough and breathing heavy, shortness of breath with exercise, and increased mucus every morning. She is currently smoking 1-2 cigarettes a day. She has been using Symbicort 160/4.5 2 puffs in the morning and 2 puffs in the evening as well as albuterol inhaler 2 puffs in the morning and 2 puffs in the evening. She reports using the nebulizer with 3 vials of albuterol at one time every other day. She is currently not taking Singulair 10 mg at bedtime as it made her too drowsy in the morning. Gwendolyn Clay has reported a problem in her apartment with mold growing in all of the the vents which began in July which is when she noticed her first respiratory symptoms. The vents have recently been cleaned but the mold has returned.  Gwendolyn Clay has been experiencing nasal congestion  beginning in July and is currently unable to taste or smell food. She reports the nasal congestion clears a little bit when she is sitting outside and sitting up. She has previously used Flonase and found it was not effective for her nasal congestion and rhinitis. She has been using Afrin nasal spray daily, one time in the morning and 2 times in the evening. She is currently not taking Xyzal 5 mg tablet as it makes her drowsy in the morning but she is no sure if the singulair or Xyzal was making her drowsy the next day. Gwendolyn Clay reports  that she feels dizzy when she gets up from sitting down.  Aunesty reports patches of red raised bumps on her arms, mostly in the antecubital area, that last for 1-2 weeks and began in June. She is treating these with she better an tea tree oil daily with some success.  Otherwise, there have been no changes to her past medical history, surgical history, family history, or social history.  Review of Systems: a 14-point review of systems is pertinent for what is mentioned in HPI.  Otherwise, all other systems were negative. Constitutional: negative other than that listed in the HPI Eyes: negative other than that listed in the HPI Ears, nose, mouth, throat, and face: negative other than that listed in the HPI Respiratory: negative other than that listed in the HPI Cardiovascular: negative other than that listed in the HPI Gastrointestinal: negative other than that listed in the HPI Genitourinary: negative other than that listed in the HPI Integument: negative other than that listed in the HPI Hematologic: negative other than that listed in the HPI Musculoskeletal: negative other than that listed in the HPI Neurological: negative other than that listed in the HPI Allergy/Immunologic: negative other than that listed in the HPI    Objective:   Blood pressure 118/66, pulse 84, resp. rate 20, height  (1.778 m), weight 211 lb 12.8 oz (96.1 kg). Body mass index is 30.39 kg/m.   Physical Exam:  General: Alert, interactive, in no acute distress. Eyes: No conjunctival injection present on the right and No conjunctival injection present on the left Ears: Right TM erythematous but not bulging, Left TM erythematous but not bulging and Left TM unable to be visualized due to cerumen impaction.  Nose/Throat: External nose within normal limits and septum midline, turbinates markedly edematous with clear discharge, post-pharynx moderately erythematous with cobblestoning in the posterior  oropharynx. Tonsils 2+ without exudates Neck: Supple without thyromegaly. Lungs: Clear to auscultation without wheezing, rhonchi or rales. No increased work of breathing. CV: Normal S1/S2, no murmurs. Capillary refill <2 seconds.  Skin: Warm and dry, without lesions or rashes. Neuro:   Grossly intact. No focal deficits appreciated. Responsive to questions.  Spirometry: results normal (FEV1: 3.53/107%, FVC: 4.13/105 %, FEV1/FVC: 0.85/100%).    Spirometry consistent with normal pattern.    Thermon Leyland, FNP [facility]  I performed a history and physical examination of the patient and discussed management with the NP Eevie Lapp. I reviewed the NP's note and agree with the documented findings and plan of care. The note in its entirety was edited by myself, including the physical exam, assessment, and plan.

## 2017-05-10 MED ORDER — MONTELUKAST SODIUM 5 MG PO CHEW: 5.0000 mg | CHEWABLE_TABLET | Freq: Every day | ORAL | 5 refills | Status: DC

## 2017-05-10 MED ORDER — ALBUTEROL SULFATE HFA 108 (90 BASE) MCG/ACT IN AERS: 2.0000 | INHALATION_SPRAY | RESPIRATORY_TRACT | 1 refills | Status: AC | PRN

## 2017-05-10 MED ORDER — LORATADINE 10 MG PO TABS: 10.0000 mg | ORAL_TABLET | Freq: Every day | ORAL | 5 refills | Status: DC

## 2017-05-10 MED ORDER — PREDNISONE 10 MG PO TABS: ORAL_TABLET | ORAL | 0 refills | Status: DC

## 2017-05-10 MED ORDER — TRIAMCINOLONE ACETONIDE 0.1 % EX OINT: 1.0000 "application " | TOPICAL_OINTMENT | Freq: Two times a day (BID) | CUTANEOUS | 3 refills | Status: DC

## 2017-05-10 MED ORDER — MOMETASONE FUROATE 50 MCG/ACT NA SUSP: 2.0000 | Freq: Every day | NASAL | 5 refills | Status: DC

## 2017-05-13 LAB — ALLERGENS W/TOTAL IGE AREA 2
COTTONWOOD IGE: 0.19 kU/L — AB
Cat Dander IgE: 0.16 kU/L — AB
Cladosporium Herbarum IgE: <0.1 kU/L
Cockroach, German IgE: 2.59 kU/L — AB
Common Silver Birch IgE: 0.18 kU/L — AB
D Farinae IgE: 0.67 kU/L — AB
D Pteronyssinus IgE: 0.55 kU/L — AB
Dog Dander IgE: 0.22 kU/L — AB
Elm, American IgE: 0.31 kU/L — AB
G002-IGE BERMUDA GRASS: 0.24 kU/L — AB
G010-IGE JOHNSON GRASS: 0.42 kU/L — AB
IgE (Immunoglobulin E), Serum: 343 IU/mL — ABNORMAL HIGH (ref 0–100)
M006-IGE ALTERNARIA ALTERNATA: 1.54 kU/L — AB
Maple/Box Elder IgE: 0.24 kU/L — AB
Mouse Urine IgE: <0.1 kU/L
Oak, White IgE: 0.23 kU/L — AB
PECAN, HICKORY IGE: 0.72 kU/L — AB
Sheep Sorrel IgE Qn: 0.15 kU/L — AB
T006-IGE CEDAR, MOUNTAIN: 0.22 kU/L — AB
TIMOTHY IGE: 0.55 kU/L — AB
W001-IGE RAGWEED, SHORT: 0.17 kU/L — AB
W014-IGE PIGWEED, ROUGH: 0.18 kU/L — AB
White Mulberry IgE: <0.1 kU/L

## 2017-05-27 ENCOUNTER — Telehealth: Payer: Self-pay | Admitting: Family Medicine

## 2017-05-27 NOTE — Telephone Encounter (Signed)
I called this patient to inquire how she is breathing. Left a message on her voicemail to call back

## 2017-05-31 ENCOUNTER — Telehealth: Payer: Self-pay | Admitting: Family Medicine

## 2017-05-31 NOTE — Telephone Encounter (Signed)
Can you please call this patient and find ou if she was able to pick up the samples from our office. Also please ask her if she is breathing better. Thank you

## 2017-05-31 NOTE — Telephone Encounter (Signed)
Called patient and she stated that she was not able to get the samples and that they were going to pick up her medication on Monday when they got paid. She stated that since she hasn't use the medication she is still breathing the same. Patient will call us with an update once she begins her medication.

## 2017-08-19 ENCOUNTER — Ambulatory Visit: Payer: Self-pay | Admitting: Obstetrics

## 2017-09-09 ENCOUNTER — Ambulatory Visit: Payer: Medicaid Other | Admitting: Obstetrics

## 2017-09-13 ENCOUNTER — Ambulatory Visit: Payer: Medicaid Other | Admitting: Obstetrics

## 2017-09-23 ENCOUNTER — Ambulatory Visit (INDEPENDENT_AMBULATORY_CARE_PROVIDER_SITE_OTHER): Payer: Medicaid Other | Admitting: Obstetrics

## 2017-09-23 ENCOUNTER — Encounter: Payer: Self-pay | Admitting: Obstetrics

## 2017-09-23 ENCOUNTER — Other Ambulatory Visit (HOSPITAL_COMMUNITY)
Admission: RE | Admit: 2017-09-23 | Discharge: 2017-09-23 | Disposition: A | Discharge status: Home / Self Care | Payer: Medicaid Other | Source: Ambulatory Visit | Attending: Obstetrics | Admitting: Obstetrics

## 2017-09-23 VITALS — BP 114/75 | HR 73 | Temp 98.8°F | Resp 16 | Wt 220.6 lb

## 2017-09-23 DIAGNOSIS — N898 Other specified noninflammatory disorders of vagina: Secondary | ICD-10-CM | POA: Insufficient documentation

## 2017-09-23 DIAGNOSIS — Z01419 Encounter for gynecological examination (general) (routine) without abnormal findings: Secondary | ICD-10-CM | POA: Diagnosis present

## 2017-09-23 DIAGNOSIS — Z01411 Encounter for gynecological examination (general) (routine) with abnormal findings: Secondary | ICD-10-CM | POA: Insufficient documentation

## 2017-09-23 DIAGNOSIS — F17210 Nicotine dependence, cigarettes, uncomplicated: Secondary | ICD-10-CM | POA: Diagnosis not present

## 2017-09-23 DIAGNOSIS — Z0001 Encounter for general adult medical examination with abnormal findings: Secondary | ICD-10-CM | POA: Diagnosis not present

## 2017-09-23 DIAGNOSIS — Z1151 Encounter for screening for human papillomavirus (HPV): Secondary | ICD-10-CM | POA: Diagnosis not present

## 2017-09-23 DIAGNOSIS — Z975 Presence of (intrauterine) contraceptive device: Secondary | ICD-10-CM

## 2017-09-23 NOTE — Progress Notes (Signed)
Subjective:        Gwendolyn Clay is a 32 y.o. female here for a routine exam.  Current complaints: None.    Personal health questionnaire:  Is patient Ashkenazi Jewish, have a family history of breast and/or ovarian cancer: no Is there a family history of uterine cancer diagnosed at age < 6, gastrointestinal cancer, urinary tract cancer, family member who is a Personnel officer syndrome-associated carrier: no Is the patient overweight and hypertensive, family history of diabetes, personal history of gestational diabetes, preeclampsia or PCOS: no Is patient over 66, have PCOS,  family history of premature CHD under age 37, diabetes, smoke, have hypertension or peripheral artery disease:  no At any time, has a partner hit, kicked or otherwise hurt or frightened you?: no Over the past 2 weeks, have you felt down, depressed or hopeless?: no Over the past 2 weeks, have you felt little interest or pleasure in doing things?:no   Gynecologic History Patient's last menstrual period was 08/22/2017 (exact date). Contraception: Nexplanon Last Pap: 2016. Results were: normal Last mammogram: n/a. Results were: n/a  Obstetric History OB History  Gravida Para Term Preterm AB Living  2 2 2     2   SAB TAB Ectopic Multiple Live Births        0 2    # Outcome Date GA Lbr Len/2nd Weight Sex Delivery Anes PTL Lv  2 Term 09/08/14 [redacted]w[redacted]d 00:40 / 00:06 6 lb 2.1 oz (2.78 kg) M Vag-Spont EPI  LIV     Birth Comments: Passed hearing screen Hgb, Normal, FA Newborn Screen Barcode: [id number] Date Collected: 09/09/2014  1 Term 03/09/09 [redacted]w[redacted]d  5 lb 12 oz (2.608 kg) F Vag-Spont EPI  LIV      Past Medical History:  Diagnosis Date  . Allergic rhinitis   . Asthma   . Eczema   . Environmental allergies    mold and roaches cause wheezing  . Hemorrhoids    SYMPTOMATIC  . History of gonorrhea   . History of hyperthyroidism as a child    age 32 post adenoidectomy--  treated w/ oral radioiodine therapy  .  History of pneumonia 09/02/2016   MULTIFOCAL PNEUNOMIA-- resolved  . History of trichomonal vaginitis   . Hypothyroidism, postradioiodine therapy    age 34--- currently pt takes thyroid thrive otc    Past Surgical History:  Procedure Laterality Date  . ADENOIDECTOMY  age 81  . CLOSED REDUCTION METACARPAL WITH PERCUTANEOUS PINNING Right 10/13/2015   Procedure: CLOSED REDUCTION PERCUTANEOUS PINNING RIGHT SMALL METACARPAL FRACTURE ;  Surgeon: Betha Loa, MD;  Location: [facility];  Service: Orthopedics;  Laterality: Right;  . EVALUATION UNDER ANESTHESIA WITH HEMORRHOIDECTOMY N/A 11/29/2016   Procedure: EXAM UNDER ANESTHESIA WITH HEMORRHOIDECTOMY X3,  LIGATION AND PEXY;  Surgeon: Karie Soda, MD;  Location: [facility];  Service: General;  Laterality: N/A;  . WISDOM TOOTH EXTRACTION  2010     Current Outpatient Medications:  .  albuterol (PROAIR HFA) 108 (90 Base) MCG/ACT inhaler, Inhale 2 puffs into the lungs every 4 (four) hours as needed for wheezing or shortness of breath., Disp: 1 Inhaler, Rfl: 1 .  albuterol (PROVENTIL) (2.5 MG/3ML) 0.083% nebulizer solution, Take 3 mLs (2.5 mg total) by nebulization every 4 (four) hours as needed for wheezing or shortness of breath., Disp: 150 mL, Rfl: 1 .  budesonide-formoterol (SYMBICORT) 160-4.5 MCG/ACT inhaler, 2 puffs twice a day Rinse Gargle and Spit after use (Patient taking differently: daily as needed. 2 puffs twice  a day Rinse Gargle and Spit after use), Disp: 1 Inhaler, Rfl: 3 .  etonogestrel (NEXPLANON) 68 MG IMPL implant, 1 each by Subdermal route once., Disp: , Rfl:  .  mometasone (NASONEX) 50 MCG/ACT nasal spray, Place 2 sprays into the nose daily., Disp: 16 g, Rfl: 5 .  naproxen (NAPROSYN) 500 MG tablet, Take 1 tablet (500 mg total) by mouth every 12 (twelve) hours as needed for mild pain or moderate pain., Disp: 30 tablet, Rfl: 1 .  oxymetazoline (AFRIN) 0.05 % nasal spray, Place 1 spray into both nostrils 2 (two) times daily., Disp:  , Rfl:  .  loratadine (CLARITIN) 10 MG tablet, Take 1 tablet (10 mg total) by mouth daily. (Patient not taking: Reported on 09/23/2017), Disp: 30 tablet, Rfl: 5 .  montelukast (SINGULAIR) 5 MG chewable tablet, Chew 1 tablet (5 mg total) by mouth at bedtime. (Patient not taking: Reported on 09/23/2017), Disp: 30 tablet, Rfl: 5 No Known Allergies  Social History   Tobacco Use  . Smoking status: Current Every Day Smoker    Packs/day: 0.33    Years: 13.00    Pack years: 4.29    Types: Cigarettes  . Smokeless tobacco: Never Used  . Tobacco comment: smoke 5-6 cigarettes daily  Substance Use Topics  . Alcohol use: Yes    Comment: social    Family History  Problem Relation Age of Onset  . Arthritis Maternal Aunt   . Diabetes Maternal Uncle   . Hypertension Maternal Grandmother   . Cancer Maternal Grandfather        PROSTATE  . Asthma Unknown        neices and nephews  . Eczema Unknown        neices and nephews  . Allergic rhinitis Brother   . Eczema Daughter   . Diabetes Father       Review of Systems  Constitutional: negative for fatigue and weight loss Respiratory: negative for cough and wheezing Cardiovascular: negative for chest pain, fatigue and palpitations Gastrointestinal: negative for abdominal pain and change in bowel habits Musculoskeletal:negative for myalgias Neurological: negative for gait problems and tremors Behavioral/Psych: negative for abusive relationship, depression Endocrine: negative for temperature intolerance    Genitourinary:negative for abnormal menstrual periods, genital lesions, hot flashes, sexual problems and vaginal discharge Integument/breast: negative for breast lump, breast tenderness, nipple discharge and skin lesion(s)    Objective:       BP 114/75 (BP Location: Right Arm, Patient Position: Sitting, Cuff Size: Large)   Pulse 73   Temp 98.8 F (37.1 C) (Oral)   Resp 16   Wt 220 lb 9.6 oz (100.1 kg)   LMP 08/22/2017 (Exact Date)    BMI 31.65 kg/m  General:   alert  Skin:   no rash or abnormalities  Lungs:   clear to auscultation bilaterally  Heart:   regular rate and rhythm, S1, S2 normal, no murmur, click, rub or gallop  Breasts:   normal without suspicious masses, skin or nipple changes or axillary nodes  Abdomen:  normal findings: no organomegaly, soft, non-tender and no hernia  Pelvis:  External genitalia: normal general appearance Urinary system: urethral meatus normal and bladder without fullness, nontender Vaginal: normal without tenderness, induration or masses Cervix: normal appearance Adnexa: normal bimanual exam Uterus: anteverted and non-tender, normal size   Lab Review Urine pregnancy test Labs reviewed yes Radiologic studies reviewed no  50% of 20 min visit spent on counseling and coordination of care.   Assessment:  1. Encounter for gynecological examination with Papanicolaou smear of cervix  2. Nexplanon in place - EXPIRES July 2019  3. Tobacco dependence due to cigarettes - tobacco cessation recommended   Plan:    Education reviewed: calcium supplements, depression evaluation, low fat, low cholesterol diet, safe sex/STD prevention, self breast exams, smoking cessation and weight bearing exercise. Contraception: Nexplanon. Follow up in: 1 year.   No orders of the defined types were placed in this encounter.  No orders of the defined types were placed in this encounter.   Brock Bad MD

## 2017-09-23 NOTE — Addendum Note (Signed)
Addended by: Coral CeoHARPER, Sommer Spickard A on: 09/23/2017 04:08 PM   Modules accepted: Orders

## 2017-09-24 LAB — HIV ANTIBODY (ROUTINE TESTING W REFLEX): HIV Screen 4th Generation wRfx: NONREACTIVE

## 2017-09-24 LAB — HEPATITIS C ANTIBODY

## 2017-09-24 LAB — RPR: RPR: NONREACTIVE

## 2017-09-24 LAB — HEPATITIS B SURFACE ANTIGEN: Hepatitis B Surface Ag: NEGATIVE

## 2017-09-25 LAB — CERVICOVAGINAL ANCILLARY ONLY
Bacterial vaginitis: POSITIVE — AB
CANDIDA VAGINITIS: NEGATIVE
Chlamydia: NEGATIVE
Neisseria Gonorrhea: NEGATIVE
Trichomonas: POSITIVE — AB

## 2017-09-26 ENCOUNTER — Other Ambulatory Visit: Payer: Self-pay | Admitting: Obstetrics

## 2017-09-26 ENCOUNTER — Telehealth: Payer: Self-pay

## 2017-09-26 ENCOUNTER — Other Ambulatory Visit: Payer: Self-pay

## 2017-09-26 DIAGNOSIS — N760 Acute vaginitis: Principal | ICD-10-CM

## 2017-09-26 DIAGNOSIS — A5901 Trichomonal vulvovaginitis: Secondary | ICD-10-CM

## 2017-09-26 DIAGNOSIS — B9689 Other specified bacterial agents as the cause of diseases classified elsewhere: Secondary | ICD-10-CM

## 2017-09-26 LAB — CYTOLOGY - PAP
DIAGNOSIS: NEGATIVE
HPV (WINDOPATH): NOT DETECTED

## 2017-09-26 MED ORDER — TINIDAZOLE 500 MG PO TABS: 2.0000 g | ORAL_TABLET | Freq: Every day | ORAL | 0 refills | Status: DC

## 2017-09-26 NOTE — Telephone Encounter (Signed)
Advised of results, rx sent and treatment plan. 

## 2017-09-26 NOTE — Progress Notes (Signed)
PA approved for tinidazole 

## 2017-09-26 NOTE — Progress Notes (Signed)
The electronic transmission of this Rx failed to go through to pharmacy. Please call this Rx in to pharmacy.

## 2017-09-30 ENCOUNTER — Telehealth: Payer: Self-pay

## 2017-09-30 NOTE — Telephone Encounter (Signed)
Pt called wanting to discuss recent lab results. Labs discussed with pt

## 2017-11-19 ENCOUNTER — Other Ambulatory Visit: Payer: Self-pay

## 2017-11-19 MED ORDER — FLUTICASONE PROPIONATE 50 MCG/ACT NA SUSP: NASAL | 0 refills | Status: DC

## 2017-11-19 NOTE — Telephone Encounter (Signed)
RF for fluticasone given x 1 with no refills, pt needs an OV

## 2018-02-03 ENCOUNTER — Ambulatory Visit: Payer: Medicaid Other | Admitting: Obstetrics

## 2018-02-11 ENCOUNTER — Ambulatory Visit: Payer: Medicaid Other | Admitting: Obstetrics

## 2018-02-20 ENCOUNTER — Ambulatory Visit: Payer: Medicaid Other | Admitting: Obstetrics

## 2018-02-27 ENCOUNTER — Ambulatory Visit (INDEPENDENT_AMBULATORY_CARE_PROVIDER_SITE_OTHER): Payer: Medicaid Other | Admitting: Obstetrics

## 2018-02-27 ENCOUNTER — Encounter: Payer: Self-pay | Admitting: Obstetrics

## 2018-02-27 VITALS — BP 99/67 | HR 79 | Ht 70.0 in | Wt 207.2 lb

## 2018-02-27 DIAGNOSIS — Z3202 Encounter for pregnancy test, result negative: Secondary | ICD-10-CM | POA: Diagnosis not present

## 2018-02-27 DIAGNOSIS — Z3046 Encounter for surveillance of implantable subdermal contraceptive: Secondary | ICD-10-CM

## 2018-02-27 LAB — POCT URINE PREGNANCY: PREG TEST UR: NEGATIVE

## 2018-02-27 MED ORDER — ETONOGESTREL 68 MG ~~LOC~~ IMPL
68.0000 mg | DRUG_IMPLANT | Freq: Once | SUBCUTANEOUS | Status: AC
  Administered 2018-02-27: 68 mg via SUBCUTANEOUS

## 2018-02-27 NOTE — Progress Notes (Signed)
Nexplanon Procedure Note    PROCEDURE: NEXPLANON REMOVAL and REINSERTION Performing Provider: Brock BadHARLES A. Meet Weathington MD  Patient education prior to procedure, explained risk, benefits of Nexplanon, reviewed alternative options. Patient reported understanding. Gave consent to continue with procedure.  Patient had Nexplanon inserted in 2016. Desires removal today w/ reinsertion  PROCEDURE:  Pregnancy Text :  not indicated Site (check):      right arm         Sterile Preparation:   Betadinex3 Lot # [id number] Expiration Date 2021  OCT  16    The patient's left arm was palpated and the implant device located. The area was prepped with Betadinex3. The distal end of the device was palpated and 1.5 cc of 1% lidocaine without epinephrine was injected. A 1.5 mm incision was made. Any fibrotic tissue was carefully dissected away using blunt and/or sharp dissection. The device was removed in an intact manner.   Insertion site was the same as the removal site. Nexplanon  was inserted subcutaneously.Needle was removed from the insertion site. Nexplanon capsule was palpated by provider and patient to assure satisfactory placement. Dressing applied.  Followup: The patient tolerated the procedure well without complications.  Standard post-procedure care is explained and return precautions are given.  Brock BadHARLES A. Effie Wahlert MD 02-27-2018

## 2018-03-13 ENCOUNTER — Ambulatory Visit: Payer: Medicaid Other | Admitting: Obstetrics

## 2018-03-17 ENCOUNTER — Ambulatory Visit (INDEPENDENT_AMBULATORY_CARE_PROVIDER_SITE_OTHER): Payer: Medicaid Other | Admitting: Obstetrics

## 2018-03-17 ENCOUNTER — Encounter: Payer: Self-pay | Admitting: Obstetrics

## 2018-03-17 VITALS — BP 102/66 | HR 75 | Wt 208.8 lb

## 2018-03-17 DIAGNOSIS — R102 Pelvic and perineal pain: Secondary | ICD-10-CM

## 2018-03-17 DIAGNOSIS — Z3046 Encounter for surveillance of implantable subdermal contraceptive: Secondary | ICD-10-CM

## 2018-03-17 DIAGNOSIS — R300 Dysuria: Secondary | ICD-10-CM | POA: Diagnosis not present

## 2018-03-17 NOTE — Progress Notes (Addendum)
Subjective:    Gwendolyn Clay is a 32 y.o. female who presents for contraception counseling. The patient is sexually active. Pertinent past medical history:  Has worsening pelvic pain, pressure and difficulty urinating since delivery of baby 3 years ago.   The information documented in the HPI was reviewed and verified.  Menstrual History: OB History    Gravida  2   Para  2   Term  2   Preterm      AB      Living  2     SAB      TAB      Ectopic      Multiple  0   Live Births  2            No LMP recorded. Patient has had an implant.   Patient Active Problem List   Diagnosis Date Noted  . Severe persistent asthma 05/09/2017  . Prolapsed internal hemorrhoids, grade 4 s/p hemorrhoidal ligation/pexy & hemorrhoidectomy 11/29/2016 11/29/2016  . Multifocal pneumonia 09/03/2016  . Metabolic acidosis, normal anion gap (NAG) 09/03/2016  . Leukopenia 09/03/2016  . Tobacco abuse 09/03/2016  . Normocytic anemia 09/03/2016  . Finger joint stiff 01/09/2016  . Displaced fracture of base of fifth metacarpal bone of right hand with routine healing 11/21/2015  . Hand discomfort 11/21/2015  . NSVD (normal spontaneous vaginal delivery) 09/08/2014  . Normal labor and delivery 09/07/2014  . 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 Gestation period, 33 weeks 07/28/2014  . Currently pregnant 07/28/2014  . Nausea with vomiting 02/13/2014  . Back pain 08/02/2012  . Status asthmaticus 08/02/2012  . Allergic rhinitis   . Eczema   . Asthma   . History of Graves' disease   . Obese    Past Medical History:  Diagnosis Date  . Allergic rhinitis   . Asthma   . Eczema   . Environmental allergies    mold and roaches cause wheezing  . Hemorrhoids    SYMPTOMATIC  . History of gonorrhea   . History of hyperthyroidism as a child    age 4 post adenoidectomy--  treated w/ oral radioiodine therapy  . History of pneumonia 09/02/2016   MULTIFOCAL PNEUNOMIA-- resolved  . History of trichomonal  vaginitis   . Hypothyroidism, postradioiodine therapy    age 89--- currently pt takes thyroid thrive otc    Past Surgical History:  Procedure Laterality Date  . ADENOIDECTOMY  age 80  . CLOSED REDUCTION METACARPAL WITH PERCUTANEOUS PINNING Right 10/13/2015   Procedure: CLOSED REDUCTION PERCUTANEOUS PINNING RIGHT SMALL METACARPAL FRACTURE ;  Surgeon: Betha Loa, MD;  Location: [facility];  Service: Orthopedics;  Laterality: Right;  . EVALUATION UNDER ANESTHESIA WITH HEMORRHOIDECTOMY N/A 11/29/2016   Procedure: EXAM UNDER ANESTHESIA WITH HEMORRHOIDECTOMY X3,  LIGATION AND PEXY;  Surgeon: Karie Soda, MD;  Location: [facility];  Service: General;  Laterality: N/A;  . WISDOM TOOTH EXTRACTION  2010     Current Outpatient Medications:  .  etonogestrel (NEXPLANON) 68 MG IMPL implant, 1 each by Subdermal route once., Disp: , Rfl:  .  oxymetazoline (AFRIN) 0.05 % nasal spray, Place 1 spray into both nostrils 2 (two) times daily., Disp: , Rfl:  .  albuterol (PROAIR HFA) 108 (90 Base) MCG/ACT inhaler, Inhale 2 puffs into the lungs every 4 (four) hours as needed for wheezing or shortness of breath. (Patient not taking: Reported on 02/27/2018), Disp: 1 Inhaler, Rfl: 1 .  albuterol (PROVENTIL) (2.5 MG/3ML) 0.083% nebulizer solution, Take 3 mLs (  2.5 mg total) by nebulization every 4 (four) hours as needed for wheezing or shortness of breath. (Patient not taking: Reported on 02/27/2018), Disp: 150 mL, Rfl: 1 .  budesonide-formoterol (SYMBICORT) 160-4.5 MCG/ACT inhaler, 2 puffs twice a day Rinse Gargle and Spit after use (Patient not taking: Reported on 02/27/2018), Disp: 1 Inhaler, Rfl: 3 .  fluticasone (FLONASE) 50 MCG/ACT nasal spray, 1-2 sprays in each nostril daily (Patient not taking: Reported on 02/27/2018), Disp: 16 g, Rfl: 0 .  loratadine (CLARITIN) 10 MG tablet, Take 1 tablet (10 mg total) by mouth daily. (Patient not taking: Reported on 09/23/2017), Disp: 30 tablet, Rfl: 5 .  mometasone (NASONEX)  50 MCG/ACT nasal spray, Place 2 sprays into the nose daily. (Patient not taking: Reported on 02/27/2018), Disp: 16 g, Rfl: 5 .  montelukast (SINGULAIR) 5 MG chewable tablet, Chew 1 tablet (5 mg total) by mouth at bedtime. (Patient not taking: Reported on 09/23/2017), Disp: 30 tablet, Rfl: 5 .  naproxen (NAPROSYN) 500 MG tablet, Take 1 tablet (500 mg total) by mouth every 12 (twelve) hours as needed for mild pain or moderate pain. (Patient not taking: Reported on 02/27/2018), Disp: 30 tablet, Rfl: 1 .  tinidazole (TINDAMAX) 500 MG tablet, Take 4 tablets (2,000 mg total) by mouth daily with breakfast. (Patient not taking: Reported on 02/27/2018), Disp: 8 tablet, Rfl: 0 No Known Allergies  Social History   Tobacco Use  . Smoking status: Current Every Day Smoker    Packs/day: 0.33    Years: 13.00    Pack years: 4.29    Types: Cigarettes  . Smokeless tobacco: Never Used  . Tobacco comment: smoke 5-6 cigarettes daily  Substance Use Topics  . Alcohol use: Yes    Comment: social    Family History  Problem Relation Age of Onset  . Arthritis Maternal Aunt   . Diabetes Maternal Uncle   . Hypertension Maternal Grandmother   . Cancer Maternal Grandfather        PROSTATE  . Asthma Unknown        neices and nephews  . Eczema Unknown        neices and nephews  . Allergic rhinitis Brother   . Eczema Daughter   . Diabetes Father        Review of Systems Constitutional: negative for weight loss Genitourinary:negative for abnormal menstrual periods and vaginal discharge   Objective:   BP 102/66   Pulse 75   Wt 208 lb 12.8 oz (94.7 kg)   BMI 29.96 kg/m    PE:        Right Upper Extremity:  Nexplanon insertion site clean, dry and intact.  Rod palpated, intact, non tender.   Lab Review Urine pregnancy test Labs reviewed yes Radiologic studies reviewed no  50% of 15 min visit spent on counseling and coordination of care.    Assessment:    32 y.o., continuing Nexplanon, no  contraindications.   Dysuria  Plan:    1. Dysuria Rx: - Urine Culture - Ambulatory referral to Urogynecology  2. Pelvic pain and pressure in female Rx: - Ambulatory referral to Urogynecology    All questions answered. Discussed healthy lifestyle modifications. Follow up as needed. Urine culture and sensitivity.   Orders Placed This Encounter  Procedures  . Urine Culture  . Ambulatory referral to Urogynecology    Referral Priority:   Routine    Referral Type:   Consultation    Referral Reason:   Specialty Services Required    Requested  Specialty:   Urology    Number of Visits Requested:   1    Brock BadHARLES A. HARPER MD 03-17-2018

## 2018-03-17 NOTE — Progress Notes (Signed)
Pt presents for Nexplanon f/u. No complaints per pt.

## 2018-03-19 LAB — URINE CULTURE

## 2018-03-24 DIAGNOSIS — Z3481 Encounter for supervision of other normal pregnancy, first trimester: Secondary | ICD-10-CM

## 2018-03-26 ENCOUNTER — Telehealth: Payer: Self-pay

## 2018-03-26 NOTE — Telephone Encounter (Signed)
Unable to reach patient, tel rang then busy signal.  Needs clarification before completing Disability & Leave paperwork.

## 2018-04-01 ENCOUNTER — Telehealth: Payer: Self-pay

## 2018-04-01 NOTE — Telephone Encounter (Signed)
Spoke with patient, she wants FMLA to cover 8/2-01/2018 for abdominal pain. [facility]

## 2018-04-01 NOTE — Telephone Encounter (Signed)
Returned tel. Call to pt: got a recording "the person you are calling cannot accept calls at this time"   MyChart message sent to patient.

## 2018-04-15 ENCOUNTER — Emergency Department (HOSPITAL_COMMUNITY): Payer: Medicaid Other

## 2018-04-15 ENCOUNTER — Encounter (HOSPITAL_COMMUNITY): Payer: Self-pay | Admitting: Emergency Medicine

## 2018-04-15 ENCOUNTER — Emergency Department (HOSPITAL_COMMUNITY)
Admission: EM | Admit: 2018-04-15 | Discharge: 2018-04-15 | Disposition: A | Discharge status: Home / Self Care | Payer: Medicaid Other | Attending: Emergency Medicine | Admitting: Emergency Medicine

## 2018-04-15 ENCOUNTER — Other Ambulatory Visit: Payer: Self-pay

## 2018-04-15 DIAGNOSIS — Z79899 Other long term (current) drug therapy: Secondary | ICD-10-CM | POA: Insufficient documentation

## 2018-04-15 DIAGNOSIS — J45901 Unspecified asthma with (acute) exacerbation: Secondary | ICD-10-CM | POA: Insufficient documentation

## 2018-04-15 DIAGNOSIS — E039 Hypothyroidism, unspecified: Secondary | ICD-10-CM | POA: Insufficient documentation

## 2018-04-15 DIAGNOSIS — R0602 Shortness of breath: Secondary | ICD-10-CM | POA: Diagnosis present

## 2018-04-15 DIAGNOSIS — J069 Acute upper respiratory infection, unspecified: Secondary | ICD-10-CM | POA: Insufficient documentation

## 2018-04-15 DIAGNOSIS — F17210 Nicotine dependence, cigarettes, uncomplicated: Secondary | ICD-10-CM | POA: Diagnosis not present

## 2018-04-15 LAB — CBC WITH DIFFERENTIAL/PLATELET
Basophils Absolute: 0 10*3/uL (ref 0.0–0.1)
Basophils Relative: 1 %
EOS PCT: 3 %
Eosinophils Absolute: 0.1 10*3/uL (ref 0.0–0.7)
HCT: 34.2 % — ABNORMAL LOW (ref 36.0–46.0)
Hemoglobin: 11.8 g/dL — ABNORMAL LOW (ref 12.0–15.0)
LYMPHS PCT: 22 %
Lymphs Abs: 0.9 10*3/uL (ref 0.7–4.0)
MCH: 30.4 pg (ref 26.0–34.0)
MCHC: 34.5 g/dL (ref 30.0–36.0)
MCV: 88.1 fL (ref 78.0–100.0)
Monocytes Absolute: 0.4 10*3/uL (ref 0.1–1.0)
Monocytes Relative: 10 %
NEUTROS PCT: 64 %
Neutro Abs: 2.6 10*3/uL (ref 1.7–7.7)
PLATELETS: 224 10*3/uL (ref 150–400)
RBC: 3.88 MIL/uL (ref 3.87–5.11)
RDW: 14.3 % (ref 11.5–15.5)
WBC: 4 10*3/uL (ref 4.0–10.5)

## 2018-04-15 LAB — I-STAT BETA HCG BLOOD, ED (MC, WL, AP ONLY)

## 2018-04-15 LAB — BASIC METABOLIC PANEL
ANION GAP: 7 (ref 5–15)
BUN: 5 mg/dL — AB (ref 6–20)
CHLORIDE: 108 mmol/L (ref 98–111)
CO2: 24 mmol/L (ref 22–32)
Calcium: 8.4 mg/dL — ABNORMAL LOW (ref 8.9–10.3)
Creatinine, Ser: 0.75 mg/dL (ref 0.44–1.00)
GFR calc non Af Amer: >60 mL/min (ref >60)
Glucose, Bld: 104 mg/dL — ABNORMAL HIGH (ref 70–99)
POTASSIUM: 3 mmol/L — AB (ref 3.5–5.1)
SODIUM: 139 mmol/L (ref 135–145)

## 2018-04-15 LAB — I-STAT TROPONIN, ED: TROPONIN I, POC: 0 ng/mL (ref 0.00–0.08)

## 2018-04-15 LAB — D-DIMER, QUANTITATIVE: D-Dimer, Quant: 0.42 ug/mL-FEU (ref 0.00–0.50)

## 2018-04-15 MED ORDER — ALBUTEROL SULFATE (2.5 MG/3ML) 0.083% IN NEBU
5.0000 mg | INHALATION_SOLUTION | Freq: Once | RESPIRATORY_TRACT | Status: AC
  Administered 2018-04-15: 5 mg via RESPIRATORY_TRACT
  Filled 2018-04-15: qty 6

## 2018-04-15 MED ORDER — CETIRIZINE HCL 10 MG PO TABS: 10.0000 mg | ORAL_TABLET | Freq: Every day | ORAL | 0 refills | Status: AC

## 2018-04-15 MED ORDER — POTASSIUM CHLORIDE ER 10 MEQ PO TBCR: 10.0000 meq | EXTENDED_RELEASE_TABLET | Freq: Every day | ORAL | 0 refills | Status: AC

## 2018-04-15 MED ORDER — KETOROLAC TROMETHAMINE 15 MG/ML IJ SOLN
15.0000 mg | Freq: Once | INTRAMUSCULAR | Status: AC
  Administered 2018-04-15: 15 mg via INTRAVENOUS
  Filled 2018-04-15: qty 1

## 2018-04-15 MED ORDER — FLUTICASONE PROPIONATE 50 MCG/ACT NA SUSP
1.0000 | Freq: Once | NASAL | Status: AC
  Administered 2018-04-15: 1 via NASAL
  Filled 2018-04-15: qty 16

## 2018-04-15 MED ORDER — POTASSIUM CHLORIDE CRYS ER 20 MEQ PO TBCR
40.0000 meq | EXTENDED_RELEASE_TABLET | Freq: Once | ORAL | Status: AC
  Administered 2018-04-15: 40 meq via ORAL
  Filled 2018-04-15: qty 2

## 2018-04-15 MED ORDER — MAGNESIUM SULFATE 2 GM/50ML IV SOLN
2.0000 g | Freq: Once | INTRAVENOUS | Status: AC
  Administered 2018-04-15: 2 g via INTRAVENOUS
  Filled 2018-04-15: qty 50

## 2018-04-15 MED ORDER — FLUTICASONE PROPIONATE 50 MCG/ACT NA SUSP: 1.0000 | Freq: Every day | NASAL | 0 refills | Status: AC

## 2018-04-15 MED ORDER — SODIUM CHLORIDE 0.9 % IV BOLUS
1000.0000 mL | Freq: Once | INTRAVENOUS | Status: AC
  Administered 2018-04-15: 1000 mL via INTRAVENOUS

## 2018-04-15 MED ORDER — PREDNISONE 50 MG PO TABS: 50.0000 mg | ORAL_TABLET | Freq: Every day | ORAL | 0 refills | Status: AC

## 2018-04-15 MED ORDER — METHYLPREDNISOLONE SODIUM SUCC 125 MG IJ SOLR
125.0000 mg | Freq: Once | INTRAMUSCULAR | Status: AC
  Administered 2018-04-15: 125 mg via INTRAVENOUS
  Filled 2018-04-15: qty 2

## 2018-04-15 NOTE — ED Provider Notes (Signed)
[facility]-EMERGENCY DEPT Provider Note   CSN: [id number] Arrival date & time: 04/15/18  1539     History   Chief Complaint Chief Complaint  Patient presents with  . Shortness of Breath    HPI Gwendolyn Clay is a 32 y.o. female with a hx of asthma, allergic rhinitis, eczema, anemia, tobacco abuse, and prior multifocal pneumonia who presents to the ED with complaints of dyspnea x 1 week.  Patient reports she feels her asthma is flaring, she has had dyspnea, wheezing, and dry cough which have been intermittently occurring and progressively worsening.  She states that she feels like she needs to cough something up but cough remains dry. Her coughing has led to diffuse anterior/posterior chest pain, this was initially with coughing, and has now become constant, worse with movement of the arms and with deep breath or coughing. She has also had fever to 101 yesterday as well as URI sxs including congestion, bilateral ear discomfort, and sore throat. Other than breathing tx, and sxs being worse with walking around/exertion, no alleviating/aggravating factors. She does report mold in her apartment that she thinks may be contributing to her symptoms. Denies nausea, vomiting, diarrhea, or abdominal pain. Denies leg pain/swelling, hemoptysis, recent surgery/trauma, recent long travel, personal hx of cancer, or hx of DVT/PE. She has nexplanon in place, no other hormone use.   HPI  Past Medical History:  Diagnosis Date  . Allergic rhinitis   . Asthma   . Eczema   . Environmental allergies    mold and roaches cause wheezing  . Hemorrhoids    SYMPTOMATIC  . History of gonorrhea   . History of hyperthyroidism as a child    age 48 post adenoidectomy--  treated w/ oral radioiodine therapy  . History of pneumonia 09/02/2016   MULTIFOCAL PNEUNOMIA-- resolved  . History of trichomonal vaginitis   . Hypothyroidism, postradioiodine therapy    age 13--- currently pt takes thyroid  thrive otc    Patient Active Problem List   Diagnosis Date Noted  . Severe persistent asthma 05/09/2017  . Prolapsed internal hemorrhoids, grade 4 s/p hemorrhoidal ligation/pexy & hemorrhoidectomy 11/29/2016 11/29/2016  . Multifocal pneumonia 09/03/2016  . Metabolic acidosis, normal anion gap (NAG) 09/03/2016  . Leukopenia 09/03/2016  . Tobacco abuse 09/03/2016  . Normocytic anemia 09/03/2016  . Finger joint stiff 01/09/2016  . Displaced fracture of base of fifth metacarpal bone of right hand with routine healing 11/21/2015  . Hand discomfort 11/21/2015  . NSVD (normal spontaneous vaginal delivery) 09/08/2014  . Normal labor and delivery 09/07/2014  . 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 Gestation period, 33 weeks 07/28/2014  . Currently pregnant 07/28/2014  . Nausea with vomiting 02/13/2014  . Back pain 08/02/2012  . Status asthmaticus 08/02/2012  . Allergic rhinitis   . Eczema   . Asthma   . History of Graves' disease   . Obese     Past Surgical History:  Procedure Laterality Date  . ADENOIDECTOMY  age 41  . CLOSED REDUCTION METACARPAL WITH PERCUTANEOUS PINNING Right 10/13/2015   Procedure: CLOSED REDUCTION PERCUTANEOUS PINNING RIGHT SMALL METACARPAL FRACTURE ;  Surgeon: Betha Loa, MD;  Location: [facility];  Service: Orthopedics;  Laterality: Right;  . EVALUATION UNDER ANESTHESIA WITH HEMORRHOIDECTOMY N/A 11/29/2016   Procedure: EXAM UNDER ANESTHESIA WITH HEMORRHOIDECTOMY X3,  LIGATION AND PEXY;  Surgeon: Karie Soda, MD;  Location: [facility];  Service: General;  Laterality: N/A;  . WISDOM TOOTH EXTRACTION  2010  OB History    Gravida  2   Para  2   Term  2   Preterm      AB      Living  2     SAB      TAB      Ectopic      Multiple  0   Live Births  2            Home Medications    Prior to Admission medications   Medication Sig Start Date End Date Taking? Authorizing Provider  albuterol (PROAIR HFA) 108 (90 Base) MCG/ACT inhaler  Inhale 2 puffs into the lungs every 4 (four) hours as needed for wheezing or shortness of breath. Patient not taking: Reported on 02/27/2018 05/10/17   Marcelyn Bruins, MD  albuterol (PROVENTIL) (2.5 MG/3ML) 0.083% nebulizer solution Take 3 mLs (2.5 mg total) by nebulization every 4 (four) hours as needed for wheezing or shortness of breath. Patient not taking: Reported on 02/27/2018 02/15/16   Baxter Hire, MD  budesonide-formoterol [facility]) 160-4.5 MCG/ACT inhaler 2 puffs twice a day Rinse Gargle and Spit after use Patient not taking: Reported on 02/27/2018 02/15/16   Baxter Hire, MD  etonogestrel (NEXPLANON) 68 MG IMPL implant 1 each by Subdermal route once.    [provider]  fluticasone Aleda Grana) 50 MCG/ACT nasal spray 1-2 sprays in each nostril daily Patient not taking: Reported on 02/27/2018 11/19/17   Marcelyn Bruins, MD  loratadine (CLARITIN) 10 MG tablet Take 1 tablet (10 mg total) by mouth daily. Patient not taking: Reported on 09/23/2017 05/10/17   Marcelyn Bruins, MD  mometasone (NASONEX) 50 MCG/ACT nasal spray Place 2 sprays into the nose daily. Patient not taking: Reported on 02/27/2018 05/10/17   Marcelyn Bruins, MD  montelukast (SINGULAIR) 5 MG chewable tablet Chew 1 tablet (5 mg total) by mouth at bedtime. Patient not taking: Reported on 09/23/2017 05/10/17   Marcelyn Bruins, MD  naproxen (NAPROSYN) 500 MG tablet Take 1 tablet (500 mg total) by mouth every 12 (twelve) hours as needed for mild pain or moderate pain. Patient not taking: Reported on 02/27/2018 11/29/16   Karie Soda, MD  oxymetazoline (AFRIN) 0.05 % nasal spray Place 1 spray into both nostrils 2 (two) times daily.    [provider]  tinidazole (TINDAMAX) 500 MG tablet Take 4 tablets (2,000 mg total) by mouth daily with breakfast. Patient not taking: Reported on 02/27/2018 09/26/17   Brock Bad, MD    Family History Family History  Problem  Relation Age of Onset  . Arthritis Maternal Aunt   . Diabetes Maternal Uncle   . Hypertension Maternal Grandmother   . Cancer Maternal Grandfather        PROSTATE  . Asthma Unknown        neices and nephews  . Eczema Unknown        neices and nephews  . Allergic rhinitis Brother   . Eczema Daughter   . Diabetes Father     Social History Social History   Tobacco Use  . Smoking status: Current Every Day Smoker    Packs/day: 0.33    Years: 13.00    Pack years: 4.29    Types: Cigarettes  . Smokeless tobacco: Never Used  . Tobacco comment: smoke 5-6 cigarettes daily  Substance Use Topics  . Alcohol use: Yes    Comment: social  . Drug use: No     Allergies   Patient  has no known allergies.   Review of Systems Review of Systems  Constitutional: Positive for fever.  HENT: Positive for congestion, ear pain and sore throat. Negative for trouble swallowing.   Respiratory: Positive for cough, shortness of breath and wheezing.   Cardiovascular: Positive for chest pain. Negative for leg swelling.  Gastrointestinal: Negative for abdominal pain, diarrhea, nausea and vomiting.  Neurological: Negative for syncope, weakness and numbness.  All other systems reviewed and are negative.   Physical Exam Updated Vital Signs BP (!) 146/83 (BP Location: Left Arm)   Pulse 90   Temp 98.6 F (37 C) (Oral)   Resp 13   Ht 5\' 10"  (1.778 m)   Wt 93.9 kg   LMP 03/22/2018   SpO2 100%   BMI 29.70 kg/m   Physical Exam  Constitutional: She appears well-developed and well-nourished.  Non-toxic appearance. No distress.  HENT:  Head: Normocephalic and atraumatic.  Right Ear: Tympanic membrane normal. Tympanic membrane is not perforated, not erythematous, not retracted and not bulging.  Left Ear: Tympanic membrane normal. Tympanic membrane is not perforated, not erythematous, not retracted and not bulging.  Nose: Mucosal edema present.  Mouth/Throat: Uvula is midline. Posterior  oropharyngeal erythema present. No oropharyngeal exudate.  Patient is tolerating her own secretions without difficulty. No trismus. No drooling. No hot potato voice. Submandibular compartment is soft.   Eyes: Pupils are equal, round, and reactive to light. Conjunctivae are normal. Right eye exhibits no discharge. Left eye exhibits no discharge.  Neck: Normal range of motion. Neck supple.  Cardiovascular: Normal rate and regular rhythm.  No murmur heard. Pulmonary/Chest: Effort normal. No tachypnea. No respiratory distress. She has wheezes (mild bibasilar). She has rales (bibasilar). She exhibits tenderness (diffuse without overlying deformity or palpable crepitus. ).  Respiration even and unlabored  Abdominal: Soft. She exhibits no distension. There is no tenderness.  Lymphadenopathy:    She has no cervical adenopathy.  Neurological: She is alert.  Clear speech.   Skin: Skin is warm and dry. No rash noted.  Psychiatric: She has a normal mood and affect. Her behavior is normal.  Nursing note and vitals reviewed.    ED Treatments / Results  Labs (all labs ordered are listed, but only abnormal results are displayed) Labs Reviewed  BASIC METABOLIC PANEL - Abnormal; Notable for the following components:      Result Value   Potassium 3.0 (*)    Glucose, Bld 104 (*)    BUN 5 (*)    Calcium 8.4 (*)    All other components within normal limits  CBC WITH DIFFERENTIAL/PLATELET - Abnormal; Notable for the following components:   Hemoglobin 11.8 (*)    HCT 34.2 (*)    All other components within normal limits  D-DIMER, QUANTITATIVE (NOT AT [facility])  I-STAT BETA HCG BLOOD, ED ([facility], [facility], [facility] ONLY)  I-STAT TROPONIN, ED    EKG EKG Interpretation  Date/Time:  Tuesday April 15 2018 15:47:16 EDT Ventricular Rate:  90 PR Interval:    QRS Duration: 77 QT Interval:  450 QTC Calculation: 551 R Axis:   32 Text Interpretation:  Sinus rhythm new Nonspecific T abnrm, anterolateral leads Prolonged  QT interval Confirmed by Gwyneth Sprout ([id number]) on 04/15/2018 3:51:11 PM Also confirmed by Gwyneth Sprout ([id number]), editor Barbette Hair [phone])  on 04/15/2018 4:18:32 PM   Radiology Dg Chest 2 View  Result Date: 04/15/2018 CLINICAL DATA:  Possible mold inhalation, smoking history, coughing congestion, low-grade fever recent EXAM: CHEST - 2 VIEW COMPARISON:  Chest x-ray of 11/29/2016 FINDINGS: No active infiltrate or effusion is seen. There is some peribronchial thickening which may indicate bronchitis, possibly chronic in this patient with smoking history. No mediastinal or hilar adenopathy is seen. The heart is within normal limits in size. No bony abnormality is noted. IMPRESSION: 1. No pneumonia or pleural effusion. 2. Peribronchial thickening may indicate bronchitis, possibly chronic. Electronically Signed   By: Dwyane Dee M.D.   On: 04/15/2018 16:06    Procedures Procedures (including critical care time)  Medications Ordered in ED Medications  albuterol (PROVENTIL) (2.5 MG/3ML) 0.083% nebulizer solution 5 mg (has no administration in time range)     Initial Impression / Assessment and Plan / ED Course  I have reviewed the triage vital signs and the nursing notes.  Pertinent labs & imaging results that were available during my care of the patient were reviewed by me and considered in my medical decision making (see chart for details).   Patient presents to the ED with complaints of dyspnea/wheezing/chest pain with URI sxs. Patient nontoxic appearing, in no apparent distress, vitals WNL other than elevated BP, doubt HTN emergency. Exam notable for bibasilar rales/wheezing. DDX: asthma exacerbation, pneumonia, viral illness, ACS, pulmonary embolism, pneumothorax, effusion, edema.   Per triage work up thus far: Patient's EKG has nonspecific t wave changes, no ST elevation. Her QTc is prolonged at 550. CXR without pneumonia, effusion, pneumothorax, or bony abnormality. . She does have  peribronchial thickening which may indicate bronchitis.   Patient without significant improvement or change following albuterol nebulizer treatment per triage, will hold off on additional albuterol given no significant change and prolonged QTc. Will give steroids as well as magnesium with plans for further evaluation with basic labs, troponin, and d-dimer.  Patient's work-up has been reviewed: No leukocytosis.  Hemoglobin 11.8 consistent with prior.  Patient is hypokalemic at 3.0, suspect this is secondary to albuterol use, will orally replaced in the emergency department, she additionally is already receiving magnesium.  Mild hypocalcemia at 8.4.  Creatinine within normal limits.  She is not pregnant.  Low risk Wells, d-dimer negative, doubt pulmonary embolism.  EKG with some nonspecific T wave changes, initial troponin is negative after greater than 3 hours of pain, doubt ACS.   19:30: RE-EVAL: Patient states that her breathing is feeling much better following magnesium and steroids.  She states that her nose still feeling congested and she still having some chest wall discomfort.  Remains with some bibasilar adventitious lung sounds, but is moving air well. Will administer Toradol and give Flonase with reassessment. Plan for ambulatory pulse ox.   Per RN patient with some lightheadedness when transitioning from sitting to standing, RN decided to hold ambulation attempt secondary to lightheadedness. Discussed with patient she has not had much to eat or drink today- Will administer 1 L of fluids.   22:15: RE-EVAL: Patient feeling much better following Toradol, Flonase, and fluids.  I personally ambulated patient with SPO2 monitoring, she is without significant lightheadedness or dyspnea, SPO2 remaining greater than 98%.  Suspect asthma exacerbation triggered by viral URI versus allergies or combination of the two.  Given patient is moving air well, feels much better, and is ambulatory with SPO2  remaining greater than 98% she appears safe for discharge home at this time.  Will place on steroid burst, a few days of K-Dur, and continued nebulizer treatments at home.  Additionally will give Flonase as well as Zyrtec for possible allergic etiology. I discussed results, treatment plan, need  for PCP follow-up, and return precautions with the patient. Provided opportunity for questions, patient confirmed understanding and is in agreement with plan.   Findings and plan of care discussed with supervising physician Dr.Plunkett who is in agreement with plan.   Vitals:   04/15/18 1944 04/15/18 2301  BP: (!) 126/94 117/81  Pulse: 75 80  Resp: 20 18  Temp: 98.8 F (37.1 C)   SpO2: 100% 100%    Final Clinical Impressions(s) / ED Diagnoses   Final diagnoses:  Exacerbation of asthma, unspecified asthma severity, unspecified whether persistent  URI with cough and congestion    ED Discharge Orders         Ordered    predniSONE (DELTASONE) 50 MG tablet  Daily with breakfast     04/15/18 2242    fluticasone (FLONASE) 50 MCG/ACT nasal spray  Daily     04/15/18 2242    cetirizine (ZYRTEC ALLERGY) 10 MG tablet  Daily     04/15/18 2242    potassium chloride (K-DUR) 10 MEQ tablet  Daily     04/15/18 2242           Chinaza Rooke, Pleas Koch, PA-C 04/15/18 2359    Gwyneth Sprout, MD 04/16/18 0017

## 2018-04-15 NOTE — ED Triage Notes (Signed)
Patient here from home with complaints of SOB and chest pain. Hx of asthma. Reports breathing treatments consistently with no relief.

## 2018-04-15 NOTE — Discharge Instructions (Addendum)
You are seen in the emergency department today for cough and shortness of breath as well as chest pain.  Your work-up in the emergency department was overall reassuring.  Your chest x-ray did not show pneumonia, collapsed lung, or fluid in the lungs.  Your x-ray did show findings consistent with bronchitis.  Your lab work showed that your potassium was somewhat low, we gave you dose of this in the ER and sending you home with oral replacement in the form of a pill.  Your EKG and the enzyme used to check your heart did not show signs of an acute heart attack.  The lab test we used to look for a blood clot in your lungs was normal.  Suspect allergies and or a virus have triggered an asthma exacerbation we are sending you home with multiple medications to help with this: -Prednisone-this is a steroid to help with the inflammation -Flonase-as the nasal spray to help with congestion -Zyrtec-this is a medicine to help with allergies -K-Dur-this is potassium as discussed above.  We have prescribed you new medication(s) today. Discuss the medications prescribed today with your pharmacist as they can have adverse effects and interactions with your other medicines including over the counter and prescribed medications. Seek medical evaluation if you start to experience new or abnormal symptoms after taking one of these medicines, seek care immediately if you start to experience difficulty breathing, feeling of your throat closing, facial swelling, or rash as these could be indications of a more serious allergic reaction  We would like you to utilize your albuterol breathing treatments every 4-6 hours for the next 24 to 48 hours.  Please follow-up with your primary care provider within 3 days.  Return to ER for new or worsening symptoms or any other concerns.

## 2018-04-15 NOTE — ED Notes (Signed)
Pt was too dizzy to walk

## 2018-05-05 ENCOUNTER — Other Ambulatory Visit (HOSPITAL_COMMUNITY): Payer: Self-pay | Admitting: Respiratory Therapy

## 2018-05-05 DIAGNOSIS — J45901 Unspecified asthma with (acute) exacerbation: Secondary | ICD-10-CM

## 2018-05-12 ENCOUNTER — Encounter (HOSPITAL_COMMUNITY): Payer: Medicaid Other

## 2018-05-22 ENCOUNTER — Encounter (HOSPITAL_COMMUNITY): Payer: Medicaid Other

## 2018-05-28 ENCOUNTER — Encounter (HOSPITAL_COMMUNITY): Payer: Medicaid Other

## 2018-06-05 ENCOUNTER — Encounter (HOSPITAL_COMMUNITY): Payer: Medicaid Other

## 2018-06-06 ENCOUNTER — Ambulatory Visit (HOSPITAL_COMMUNITY)
Admission: RE | Admit: 2018-06-06 | Discharge: 2018-06-06 | Disposition: A | Discharge status: Home / Self Care | Payer: Medicaid Other | Source: Ambulatory Visit | Attending: Family Medicine | Admitting: Family Medicine

## 2018-06-06 ENCOUNTER — Encounter (HOSPITAL_COMMUNITY): Payer: Medicaid Other

## 2018-06-06 DIAGNOSIS — Z7951 Long term (current) use of inhaled steroids: Secondary | ICD-10-CM | POA: Diagnosis not present

## 2018-06-06 DIAGNOSIS — J45901 Unspecified asthma with (acute) exacerbation: Secondary | ICD-10-CM | POA: Insufficient documentation

## 2018-06-06 DIAGNOSIS — F17210 Nicotine dependence, cigarettes, uncomplicated: Secondary | ICD-10-CM | POA: Insufficient documentation

## 2018-06-06 DIAGNOSIS — Z79899 Other long term (current) drug therapy: Secondary | ICD-10-CM | POA: Diagnosis not present

## 2018-06-06 LAB — SPIROMETRY WITH GRAPH
FEF 25-75 PRE: 3.84 L/s
FEF2575-%Pred-Pre: 107 %
FEV1-%PRED-PRE: 112 %
FEV1-PRE: 3.67 L
FEV1FVC-%Pred-Pre: 96 %
FEV6-%Pred-Pre: 115 %
FEV6-Pre: 4.44 L
FEV6FVC-%Pred-Pre: 101 %
FVC-%PRED-PRE: 114 %
FVC-Pre: 4.44 L
PRE FEV1/FVC RATIO: 83 %
Pre FEV6/FVC Ratio: 100 %

## 2018-06-25 ENCOUNTER — Ambulatory Visit: Payer: Medicaid Other | Admitting: Obstetrics

## 2024-02-28 ENCOUNTER — Encounter: Payer: Self-pay | Admitting: Advanced Practice Midwife
